# Patient Record
Sex: Male | Born: 1955 | Race: White | Hispanic: No | Marital: Married | Smoking: Former smoker
Health system: Southern US, Community
[De-identification: ages and names within clinical notes are randomized; demographics above are authoritative.]

## PROBLEM LIST (undated history)

## (undated) DIAGNOSIS — G510 Bell's palsy: Secondary | ICD-10-CM

## (undated) DIAGNOSIS — I509 Heart failure, unspecified: Secondary | ICD-10-CM

## (undated) DIAGNOSIS — I10 Essential (primary) hypertension: Secondary | ICD-10-CM

## (undated) HISTORY — PX: HERNIA REPAIR: SHX51

---

## 2001-06-29 ENCOUNTER — Emergency Department (HOSPITAL_COMMUNITY): Admission: EM | Admit: 2001-06-29 | Discharge: 2001-06-29 | Payer: Self-pay | Admitting: Emergency Medicine

## 2002-12-10 ENCOUNTER — Encounter: Admission: RE | Admit: 2002-12-10 | Discharge: 2002-12-10 | Payer: Self-pay | Admitting: General Surgery

## 2002-12-10 ENCOUNTER — Encounter: Payer: Self-pay | Admitting: General Surgery

## 2017-09-12 ENCOUNTER — Other Ambulatory Visit: Payer: Self-pay | Admitting: Internal Medicine

## 2017-09-12 ENCOUNTER — Inpatient Hospital Stay (HOSPITAL_COMMUNITY)
Admission: EM | Admit: 2017-09-12 | Discharge: 2017-09-18 | DRG: 286 | Disposition: A | Discharge status: Home / Self Care | Payer: 59 | Attending: Internal Medicine | Admitting: Internal Medicine

## 2017-09-12 ENCOUNTER — Encounter (HOSPITAL_COMMUNITY): Payer: Self-pay

## 2017-09-12 ENCOUNTER — Ambulatory Visit
Admission: RE | Admit: 2017-09-12 | Discharge: 2017-09-12 | Disposition: A | Discharge status: Home / Self Care | Payer: Self-pay | Source: Ambulatory Visit | Attending: Internal Medicine | Admitting: Internal Medicine

## 2017-09-12 DIAGNOSIS — I509 Heart failure, unspecified: Secondary | ICD-10-CM

## 2017-09-12 DIAGNOSIS — R0602 Shortness of breath: Secondary | ICD-10-CM | POA: Diagnosis not present

## 2017-09-12 DIAGNOSIS — R0600 Dyspnea, unspecified: Secondary | ICD-10-CM

## 2017-09-12 DIAGNOSIS — I255 Ischemic cardiomyopathy: Secondary | ICD-10-CM | POA: Diagnosis present

## 2017-09-12 DIAGNOSIS — Z820 Family history of epilepsy and other diseases of the nervous system: Secondary | ICD-10-CM

## 2017-09-12 DIAGNOSIS — J810 Acute pulmonary edema: Secondary | ICD-10-CM

## 2017-09-12 DIAGNOSIS — I5043 Acute on chronic combined systolic (congestive) and diastolic (congestive) heart failure: Secondary | ICD-10-CM | POA: Diagnosis present

## 2017-09-12 DIAGNOSIS — Z8249 Family history of ischemic heart disease and other diseases of the circulatory system: Secondary | ICD-10-CM

## 2017-09-12 DIAGNOSIS — R600 Localized edema: Secondary | ICD-10-CM

## 2017-09-12 DIAGNOSIS — M109 Gout, unspecified: Secondary | ICD-10-CM | POA: Diagnosis present

## 2017-09-12 DIAGNOSIS — F1010 Alcohol abuse, uncomplicated: Secondary | ICD-10-CM | POA: Diagnosis present

## 2017-09-12 DIAGNOSIS — I110 Hypertensive heart disease with heart failure: Principal | ICD-10-CM | POA: Diagnosis present

## 2017-09-12 DIAGNOSIS — J9601 Acute respiratory failure with hypoxia: Secondary | ICD-10-CM | POA: Diagnosis present

## 2017-09-12 DIAGNOSIS — F17290 Nicotine dependence, other tobacco product, uncomplicated: Secondary | ICD-10-CM | POA: Diagnosis present

## 2017-09-12 DIAGNOSIS — I2582 Chronic total occlusion of coronary artery: Secondary | ICD-10-CM | POA: Diagnosis present

## 2017-09-12 DIAGNOSIS — I2510 Atherosclerotic heart disease of native coronary artery without angina pectoris: Secondary | ICD-10-CM | POA: Diagnosis present

## 2017-09-12 HISTORY — DX: Essential (primary) hypertension: I10

## 2017-09-12 HISTORY — DX: Bell's palsy: G51.0

## 2017-09-12 HISTORY — DX: Heart failure, unspecified: I50.9

## 2017-09-12 LAB — TROPONIN I: Troponin I: <0.03 ng/mL (ref <0.03)

## 2017-09-12 LAB — CBC
HCT: 44.7 % (ref 39.0–52.0)
Hemoglobin: 15.4 g/dL (ref 13.0–17.0)
MCH: 32.8 pg (ref 26.0–34.0)
MCHC: 34.5 g/dL (ref 30.0–36.0)
MCV: 95.3 fL (ref 78.0–100.0)
Platelets: 237 10*3/uL (ref 150–400)
RBC: 4.69 MIL/uL (ref 4.22–5.81)
RDW: 14.3 % (ref 11.5–15.5)
WBC: 6.7 10*3/uL (ref 4.0–10.5)

## 2017-09-12 LAB — BASIC METABOLIC PANEL
Anion gap: 10 (ref 5–15)
BUN: 18 mg/dL (ref 6–20)
CO2: 24 mmol/L (ref 22–32)
Calcium: 9.2 mg/dL (ref 8.9–10.3)
Chloride: 102 mmol/L (ref 101–111)
Creatinine, Ser: 0.97 mg/dL (ref 0.61–1.24)
GFR calc Af Amer: >60 mL/min (ref >60)
GFR calc non Af Amer: >60 mL/min (ref >60)
Glucose, Bld: 110 mg/dL — ABNORMAL HIGH (ref 65–99)
Potassium: 4.7 mmol/L (ref 3.5–5.1)
Sodium: 136 mmol/L (ref 135–145)

## 2017-09-12 LAB — BRAIN NATRIURETIC PEPTIDE: B Natriuretic Peptide: 893.6 pg/mL — ABNORMAL HIGH (ref 0.0–100.0)

## 2017-09-12 NOTE — ED Triage Notes (Signed)
Pt sent by Dr Nehemiah Settle for possible CHF. Pt had blood work done and x-ray completed earlier today and was called and told to come in to have his heart checked. Pt has been having intermittent shob with leg swelling x 2.5 months. Denies pain or shob at this time. VSS.

## 2017-09-13 ENCOUNTER — Other Ambulatory Visit (HOSPITAL_COMMUNITY): Payer: 59

## 2017-09-13 ENCOUNTER — Encounter (HOSPITAL_COMMUNITY): Payer: Self-pay | Admitting: Internal Medicine

## 2017-09-13 DIAGNOSIS — I509 Heart failure, unspecified: Secondary | ICD-10-CM

## 2017-09-13 LAB — CBC
HCT: 43.2 % (ref 39.0–52.0)
Hemoglobin: 14.6 g/dL (ref 13.0–17.0)
MCH: 32.2 pg (ref 26.0–34.0)
MCHC: 33.8 g/dL (ref 30.0–36.0)
MCV: 95.4 fL (ref 78.0–100.0)
Platelets: 215 10*3/uL (ref 150–400)
RBC: 4.53 MIL/uL (ref 4.22–5.81)
RDW: 14 % (ref 11.5–15.5)
WBC: 5.4 10*3/uL (ref 4.0–10.5)

## 2017-09-13 LAB — CREATININE, SERUM
Creatinine, Ser: 0.94 mg/dL (ref 0.61–1.24)
GFR calc Af Amer: >60 mL/min (ref >60)
GFR calc non Af Amer: >60 mL/min (ref >60)

## 2017-09-13 LAB — TROPONIN I: Troponin I: 0.03 ng/mL (ref <0.03)

## 2017-09-13 LAB — MAGNESIUM: Magnesium: 1.7 mg/dL (ref 1.7–2.4)

## 2017-09-13 LAB — HIV ANTIBODY (ROUTINE TESTING W REFLEX): HIV Screen 4th Generation wRfx: NONREACTIVE

## 2017-09-13 LAB — TSH: TSH: 4.681 u[IU]/mL — ABNORMAL HIGH (ref 0.350–4.500)

## 2017-09-13 LAB — T4, FREE: Free T4: 1.12 ng/dL (ref 0.61–1.12)

## 2017-09-13 MED ORDER — FUROSEMIDE 10 MG/ML IJ SOLN
40.0000 mg | Freq: Two times a day (BID) | INTRAMUSCULAR | Status: DC
  Administered 2017-09-13 – 2017-09-14 (×4): 40 mg via INTRAVENOUS
  Filled 2017-09-13 (×4): qty 4

## 2017-09-13 MED ORDER — FUROSEMIDE 10 MG/ML IJ SOLN
40.0000 mg | Freq: Once | INTRAMUSCULAR | Status: AC
  Administered 2017-09-13: 40 mg via INTRAVENOUS
  Filled 2017-09-13: qty 4

## 2017-09-13 MED ORDER — ACETAMINOPHEN 325 MG PO TABS: 650.0000 mg | ORAL_TABLET | Freq: Four times a day (QID) | ORAL | Status: DC | PRN

## 2017-09-13 MED ORDER — ONDANSETRON HCL 4 MG/2ML IJ SOLN: 4.0000 mg | Freq: Four times a day (QID) | INTRAMUSCULAR | Status: DC | PRN

## 2017-09-13 MED ORDER — LORAZEPAM 1 MG PO TABS: 0.0000 mg | ORAL_TABLET | Freq: Four times a day (QID) | ORAL | Status: AC

## 2017-09-13 MED ORDER — THIAMINE HCL 100 MG/ML IJ SOLN
100.0000 mg | Freq: Every day | INTRAMUSCULAR | Status: DC
Start: 2017-09-13 — End: 2017-09-16

## 2017-09-13 MED ORDER — LORAZEPAM 1 MG PO TABS: 1.0000 mg | ORAL_TABLET | Freq: Four times a day (QID) | ORAL | Status: AC | PRN

## 2017-09-13 MED ORDER — LORAZEPAM 2 MG/ML IJ SOLN: 1.0000 mg | Freq: Four times a day (QID) | INTRAMUSCULAR | Status: AC | PRN

## 2017-09-13 MED ORDER — ADULT MULTIVITAMIN W/MINERALS CH
1.0000 | ORAL_TABLET | Freq: Every day | ORAL | Status: DC
  Administered 2017-09-13 – 2017-09-18 (×6): 1 via ORAL
  Filled 2017-09-13 (×6): qty 1

## 2017-09-13 MED ORDER — FOLIC ACID 1 MG PO TABS
1.0000 mg | ORAL_TABLET | Freq: Every day | ORAL | Status: DC
  Administered 2017-09-13 – 2017-09-18 (×6): 1 mg via ORAL
  Filled 2017-09-13 (×6): qty 1

## 2017-09-13 MED ORDER — ENOXAPARIN SODIUM 40 MG/0.4ML ~~LOC~~ SOLN
40.0000 mg | SUBCUTANEOUS | Status: DC
  Administered 2017-09-13 – 2017-09-14 (×2): 40 mg via SUBCUTANEOUS
  Filled 2017-09-13 (×2): qty 0.4

## 2017-09-13 MED ORDER — POTASSIUM CHLORIDE CRYS ER 10 MEQ PO TBCR
10.0000 meq | EXTENDED_RELEASE_TABLET | Freq: Every day | ORAL | Status: DC
  Administered 2017-09-13 – 2017-09-17 (×5): 10 meq via ORAL
  Filled 2017-09-13 (×5): qty 1

## 2017-09-13 MED ORDER — VITAMIN B-1 100 MG PO TABS
100.0000 mg | ORAL_TABLET | Freq: Every day | ORAL | Status: DC
  Administered 2017-09-13 – 2017-09-18 (×6): 100 mg via ORAL
  Filled 2017-09-13 (×6): qty 1

## 2017-09-13 MED ORDER — ONDANSETRON HCL 4 MG PO TABS: 4.0000 mg | ORAL_TABLET | Freq: Four times a day (QID) | ORAL | Status: DC | PRN

## 2017-09-13 MED ORDER — LORAZEPAM 1 MG PO TABS
0.0000 mg | ORAL_TABLET | Freq: Two times a day (BID) | ORAL | Status: AC
Start: 2017-09-15 — End: 2017-09-17

## 2017-09-13 MED ORDER — LISINOPRIL 5 MG PO TABS
5.0000 mg | ORAL_TABLET | Freq: Every day | ORAL | Status: DC
  Administered 2017-09-13 – 2017-09-14 (×2): 5 mg via ORAL
  Filled 2017-09-13 (×2): qty 1

## 2017-09-13 MED ORDER — ACETAMINOPHEN 650 MG RE SUPP: 650.0000 mg | Freq: Four times a day (QID) | RECTAL | Status: DC | PRN

## 2017-09-13 NOTE — H&P (Signed)
History and Physical    TURKI BLOK JGG:[id number] DOB: [birth year] DOA: 09/12/2017  PCP: Renford Dills, MD  Patient coming from: Home.  Chief Complaint: Shortness of breath.  HPI: Shawn Webster is a 61 y.o. male with history of Bell's palsy affecting the left side of the face with history of alcoholism and tobacco abuse has been experiencing shortness of breath on exertion over the last few days with increasing lower extremity edema.  Denies any chest pain or fever chills productive cough.  No recent travel.  Patient had gone to his PCP and was advised to come to the ER after chest x-ray was showing condition and pleural effusion.  ED Course: In the ER patient's labs reveal elevated BNP.  Chest x-ray shows congestion and bilateral pleural effusion.  Patient on my exam as bilateral lower extremity edema with elevated JVD and mild crepitations on chest exam.  Patient was given Lasix 40 mg IV and admitted for CHF.  Review of Systems: As per HPI, rest all negative.   Past Medical History:  Diagnosis Date  . Bell's palsy   . CHF (congestive heart failure) (HCC)   . Hypertension     Past Surgical History:  Procedure Laterality Date  . HERNIA REPAIR       reports that he has been smoking Cigars.  He has never used smokeless tobacco. He reports that he drinks alcohol. He reports that he does not use drugs.  No Known Allergies  Family History  Problem Relation Age of Onset  . Dementia Mother   . CAD Father     Prior to Admission medications   Medication Sig Start Date End Date Taking? Authorizing Provider  furosemide (LASIX) 40 MG tablet Take 40 mg by mouth daily.   Yes [provider]  lisinopril (PRINIVIL,ZESTRIL) 20 MG tablet Take 20 mg by mouth daily.   Yes [provider]  potassium chloride SA (K-DUR,KLOR-CON) 20 MEQ tablet Take 20 mEq by mouth daily.   Yes [provider]    Physical Exam: Vitals:   09/13/17 0300 09/13/17 0315  09/13/17 0330 09/13/17 0406  BP: 129/87 125/87 (!) 128/94 132/86  Pulse: 86 83 86 93  Resp:    18  Temp:    (!) 97.5 F (36.4 C)  TempSrc:    Oral  SpO2: 93% 94% 95% 94%  Weight:    113.1 kg (249 lb 4.8 oz)  Height:    6' (1.829 m)      Constitutional: Moderately built and nourished. Vitals:   09/13/17 0300 09/13/17 0315 09/13/17 0330 09/13/17 0406  BP: 129/87 125/87 (!) 128/94 132/86  Pulse: 86 83 86 93  Resp:    18  Temp:    (!) 97.5 F (36.4 C)  TempSrc:    Oral  SpO2: 93% 94% 95% 94%  Weight:    113.1 kg (249 lb 4.8 oz)  Height:    6' (1.829 m)   Eyes: Anicteric no pallor. ENMT: No discharge from the ears eyes nose or mouth. Neck: JVD elevated no mass felt. Respiratory: No rhonchi mild crepitations. Cardiovascular: S1-S2 heard no murmurs appreciated. Abdomen: Soft nontender bowel sounds present. Musculoskeletal: Bilateral lower extremity edema. Skin: No rash.  Skin appears warm. Neurologic: Alert awake oriented to time place and person.  Moves all extremities. Psychiatric: Appears normal.  Normal affect.   Labs on Admission: I have personally reviewed following labs and imaging studies  CBC:  Recent Labs Lab 09/12/17 1951  WBC 6.7  HGB 15.4  HCT 44.7  MCV 95.3  PLT 237   Basic Metabolic Panel:  Recent Labs Lab 09/12/17 1951  NA 136  K 4.7  CL 102  CO2 24  GLUCOSE 110*  BUN 18  CREATININE 0.97  CALCIUM 9.2   GFR: Estimated Creatinine Clearance: 105.2 mL/min (by C-G formula based on SCr of 0.97 mg/dL). Liver Function Tests: No results for input(s): AST, ALT, ALKPHOS, BILITOT, PROT, ALBUMIN in the last 168 hours. No results for input(s): LIPASE, AMYLASE in the last 168 hours. No results for input(s): AMMONIA in the last 168 hours. Coagulation Profile: No results for input(s): INR, PROTIME in the last 168 hours. Cardiac Enzymes:  Recent Labs Lab 09/12/17 1951  TROPONINI <0.03   BNP (last 3 results) No results for input(s): PROBNP in  the last 8760 hours. HbA1C: No results for input(s): HGBA1C in the last 72 hours. CBG: No results for input(s): GLUCAP in the last 168 hours. Lipid Profile: No results for input(s): CHOL, HDL, LDLCALC, TRIG, CHOLHDL, LDLDIRECT in the last 72 hours. Thyroid Function Tests: No results for input(s): TSH, T4TOTAL, FREET4, T3FREE, THYROIDAB in the last 72 hours. Anemia Panel: No results for input(s): VITAMINB12, FOLATE, FERRITIN, TIBC, IRON, RETICCTPCT in the last 72 hours. Urine analysis: No results found for: COLORURINE, APPEARANCEUR, LABSPEC, PHURINE, GLUCOSEU, HGBUR, BILIRUBINUR, KETONESUR, PROTEINUR, UROBILINOGEN, NITRITE, LEUKOCYTESUR Sepsis Labs: @LABRCNTIP (procalcitonin:4,lacticidven:4) )No results found for this or any previous visit (from the past 240 hour(s)).   Radiological Exams on Admission: Dg Chest 2 View  Result Date: 09/12/2017 CLINICAL DATA:  Shortness of breath. EXAM: CHEST  2 VIEW COMPARISON:  Chest x-ray 12/10/2002 rib. FINDINGS: Mediastinum and hilar structures normal. Cardiomegaly with mild pulmonary vascular prominence, interstitial prominence, and bilateral pleural effusions consistent with CHF. Low lung volumes. IMPRESSION: 1. Mild congestive heart failure with mild pulmonary interstitial edema small pleural effusions. 2. Low lung volumes with basilar atelectasis Electronically Signed   By: Maisie Fushomas  Register   On: 09/12/2017 11:19    EKG: Independently reviewed.  Normal sinus rhythm incomplete right bundle branch block.  Assessment/Plan Principal Problem:   CHF (congestive heart failure) (HCC)    1. Acute CHF unknown EF -I have placed patient on Lasix 40 mg IV every 12.  Also placed lisinopril 5 mg p.o. daily.  Closely follow intake output and metabolic panel and daily weights.  Check 2D echo.  Check troponin. 2. Alcohol abuse -which could account for #1.  Patient has been placed on CIWA protocol. 3. Tobacco abuse -advised to quit. 4. History of gout -no signs  of exacerbation.   DVT prophylaxis: Lovenox. Code Status: Full code. Family Communication: Discussed with patient. Disposition Plan: Home. Consults called: None. Admission status: Observation.   Eduard ClosKAKRAKANDY,Marlaina Coburn N. MD [facility] Pager [phone].  If 7PM-7AM, please contact night-coverage [email or web address] Password [email or web address]  09/13/2017, 5:41 AM

## 2017-09-13 NOTE — ED Provider Notes (Signed)
[facility] EMERGENCY DEPARTMENT Provider Note   CSN: [id number] Arrival date & time: 09/12/17  1815     History   Chief Complaint Chief Complaint  Patient presents with  . Shortness of Breath  . Leg Swelling    HPI Shawn Webster is a 61 y.o. male.  HPI  This is a 61 year old male with history of hypertension who presents with concerns for lower extremity swelling and shortness of breath.Patient reports 2-3 month history of worsening shortness of breath. He has had worsening lower extremity swelling over this time. No known history of heart failure.  Denies any history of coronary artery disease. Heat baseline has two-pillow orthopnea. States he has chest pain intermittently but is currently without chest pain. He establish with a primary care physician today who did blood work, chest x-ray, and scheduled an echocardiogram. He was called regarding his x-ray and told that he needed to come to the ED. Currently he is without pain. He is at his baseline. He was started on Lasix and took one dose prior to arrival.  Past Medical History:  Diagnosis Date  . Bell's palsy   . CHF (congestive heart failure) (HCC)   . Hypertension     There are no active problems to display for this patient.   Past Surgical History:  Procedure Laterality Date  . HERNIA REPAIR         Home Medications    Prior to Admission medications   Medication Sig Start Date End Date Taking? Authorizing Provider  furosemide (LASIX) 40 MG tablet Take 40 mg by mouth daily.   Yes [provider]  lisinopril (PRINIVIL,ZESTRIL) 20 MG tablet Take 20 mg by mouth daily.   Yes [provider]  potassium chloride SA (K-DUR,KLOR-CON) 20 MEQ tablet Take 20 mEq by mouth daily.   Yes [provider]    Family History History reviewed. No pertinent family history.  Social History Social History  Substance Use Topics  . Smoking status: Current Every Day Smoker   Types: Cigars  . Smokeless tobacco: Not on file     Comment: 1 cigar per day  . Alcohol use Yes     Comment: 2 martinis per day     Allergies   Patient has no known allergies.   Review of Systems Review of Systems  Constitutional: Negative for fever.  Respiratory: Positive for shortness of breath. Negative for cough.   Cardiovascular: Positive for chest pain and leg swelling.  Gastrointestinal: Negative for abdominal pain, nausea and vomiting.  All other systems reviewed and are negative.    Physical Exam Updated Vital Signs BP 129/87   Pulse 86   Temp 98.1 F (36.7 C) (Oral)   Resp 18   Ht 6\' 1"  (1.854 m)   SpO2 93%   Physical Exam  Constitutional: He is oriented to person, place, and time. He appears well-developed and well-nourished.  Overweight, no acute distress  HENT:  Head: Normocephalic and atraumatic.  Cardiovascular: Normal rate, regular rhythm and normal heart sounds.   No murmur heard. Pulmonary/Chest: Effort normal. No respiratory distress. He has no wheezes. He has no rales.  Diminished breath sounds bilateral lower lobes  Abdominal: Soft. Bowel sounds are normal. There is no tenderness. There is no rebound.  Musculoskeletal: He exhibits edema.  2+ bilateral lower extremity pitting edema  Neurological: He is alert and oriented to person, place, and time.  Skin: Skin is warm and dry.  Psychiatric: He has a normal mood  and affect.  Nursing note and vitals reviewed.    ED Treatments / Results  Labs (all labs ordered are listed, but only abnormal results are displayed) Labs Reviewed  BASIC METABOLIC PANEL - Abnormal; Notable for the following:       Result Value   Glucose, Bld 110 (*)    All other components within normal limits  BRAIN NATRIURETIC PEPTIDE - Abnormal; Notable for the following:    B Natriuretic Peptide 893.6 (*)    All other components within normal limits  CBC  TROPONIN I    EKG  EKG Interpretation  Date/Time:  Tuesday  September 12 2017 19:42:29 EDT Ventricular Rate:  93 PR Interval:  194 QRS Duration: 98 QT Interval:  378 QTC Calculation: 469 R Axis:   110 Text Interpretation:  Normal sinus rhythm Possible Left atrial enlargement Incomplete right bundle branch block Possible Anterolateral infarct , age undetermined Abnormal ECG Low voltage; anterior changes new from 2002 Confirmed by Ross MarcusHorton, Tinika Bucknam ([id number]) on 09/13/2017 12:20:41 AM       Radiology Dg Chest 2 View  Result Date: 09/12/2017 CLINICAL DATA:  Shortness of breath. EXAM: CHEST  2 VIEW COMPARISON:  Chest x-ray 12/10/2002 rib. FINDINGS: Mediastinum and hilar structures normal. Cardiomegaly with mild pulmonary vascular prominence, interstitial prominence, and bilateral pleural effusions consistent with CHF. Low lung volumes. IMPRESSION: 1. Mild congestive heart failure with mild pulmonary interstitial edema small pleural effusions. 2. Low lung volumes with basilar atelectasis Electronically Signed   By: Maisie Fushomas  Register   On: 09/12/2017 11:19    Procedures Procedures (including critical care time)  Medications Ordered in ED Medications  furosemide (LASIX) injection 40 mg (40 mg Intravenous Given 09/13/17 0049)     Initial Impression / Assessment and Plan / ED Course  I have reviewed the triage vital signs and the nursing notes.  Pertinent labs & imaging results that were available during my care of the patient were reviewed by me and considered in my medical decision making (see chart for details).     Patient presents with acute on chronic shortness of breath, lower extremity swelling. He is overall nontoxic-appearing on exam. Vital signs reviewed and largely reassuring. EKG shows no evidence of ischemia. He does not appear in any respiratory distress. I have reviewed his chest x-ray from outside facility. He has cardiomegaly with evidence of mild heart failure and small bilateral pleural effusions. Bnp is elevated. Patient was given 1  dose of 40 mg of IV Lasix. Discussed with patient follow-up as an outpatient for echocardiogram versus inpatient evaluation. Unfortunately, with ambulation, patient drops his O2 sats in the low 80s. He does not have home oxygen. Because of this I feel he would be better managed as an inpatient for evaluation and further diuresis.    Final Clinical Impressions(s) / ED Diagnoses   Final diagnoses:  Acute pulmonary edema (HCC)  Bilateral lower extremity edema  Shortness of breath    New Prescriptions New Prescriptions   No medications on file     Shon BatonHorton, Monicia Tse F, MD 09/13/17 [phone]

## 2017-09-13 NOTE — ED Notes (Signed)
Writer ambulated, pt's O2 sats dropped to 86 on room air.

## 2017-09-13 NOTE — Progress Notes (Signed)
Patient received to unit. Patient  awake oriented x4 . Call bell within reach of patient. Text paged Dr Toniann Fail for orders. Awaiting  call response.

## 2017-09-13 NOTE — Progress Notes (Signed)
PROGRESS NOTE    Shawn Webster  XLK:[id number] DOB: 04-19-1956 DOA: 09/12/2017 PCP: Renford Dills, MD   Brief Narrative:  Shawn Webster is a 61 y.o. male with history of Bell's palsy affecting the left side of the face with history of alcoholism and tobacco abuse has been experiencing shortness of breath on exertion over the last few days with increasing lower extremity edema.  Denies any chest pain or fever chills productive cough.  No recent travel.  Patient had gone to his PCP and was advised to come to the ER after chest x-ray was showing condition and pleural effusion.  ED Course: In the ER patient's labs reveal elevated BNP.  Chest x-ray shows congestion and bilateral pleural effusion.  Patient on my exam as bilateral lower extremity edema with elevated JVD and mild crepitations on chest exam.  Patient was given Lasix 40 mg IV and admitted for CHF.  Assessment & Plan:   Principal Problem:   CHF (congestive heart failure) (HCC)   Acute CHF; awaiting ECHO to determine type of HF.  Continue with IV lasix.  Daily weight, strict I and.  ECHO pending.  On lisinopril.   Acute hypoxic respiratory failure; secondary to HF, pulmonary edema.  Continue with IV lasix.   Alcohol abuse; CIWA protocol.   Tobacco abuse -advised to quit.  History of gout -no signs of exacerbation.    DVT prophylaxis: Lovenox Code Status: full code.  Family Communication: care discussed with patient.  Disposition Plan: home when stable.   Consultants:   none   Procedures: none   Antimicrobials: none   Subjective: He report improvement of dyspnea. His oxygen drop to 80 on ambulation.  Denies chest pain  Father with history of HF  Objective: Vitals:   09/13/17 0315 09/13/17 0330 09/13/17 0406 09/13/17 0800  BP: 125/87 (!) 128/94 132/86 132/87  Pulse: 83 86 93 84  Resp:   18 18  Temp:   (!) 97.5 F (36.4 C) 98.2 F (36.8 C)  TempSrc:   Oral Oral  SpO2: 94% 95% 94% 98%    Weight:   113.1 kg (249 lb 4.8 oz)   Height:   6' (1.829 m)     Intake/Output Summary (Last 24 hours) at 09/13/17 1104 Last data filed at 09/13/17 0957  Gross per 24 hour  Intake              240 ml  Output              725 ml  Net             -485 ml   Filed Weights   09/13/17 0406  Weight: 113.1 kg (249 lb 4.8 oz)    Examination:  General exam: Appears calm and comfortable  Respiratory system: Clear to auscultation. Respiratory effort normal. Cardiovascular system: S1 & S2 heard, RRR. No JVD, murmurs, rubs, gallops or clicks. No pedal edema. Gastrointestinal system: Abdomen is nondistended, soft and nontender. No organomegaly or masses felt. Normal bowel sounds heard. Central nervous system: Alert and oriented. No focal neurological deficits. Extremities: Symmetric 5 x 5 power. Skin: No rashes, lesions or ulcers    Data Reviewed: I have personally reviewed following labs and imaging studies  CBC:  Recent Labs Lab 09/12/17 1951 09/13/17 0549  WBC 6.7 5.4  HGB 15.4 14.6  HCT 44.7 43.2  MCV 95.3 95.4  PLT 237 215   Basic Metabolic Panel:  Recent Labs Lab 09/12/17 1951 09/13/17 0549  NA  136  --   K 4.7  --   CL 102  --   CO2 24  --   GLUCOSE 110*  --   BUN 18  --   CREATININE 0.97 0.94  CALCIUM 9.2  --   MG  --  1.7   GFR: Estimated Creatinine Clearance: 108.5 mL/min (by C-G formula based on SCr of 0.94 mg/dL). Liver Function Tests: No results for input(s): AST, ALT, ALKPHOS, BILITOT, PROT, ALBUMIN in the last 168 hours. No results for input(s): LIPASE, AMYLASE in the last 168 hours. No results for input(s): AMMONIA in the last 168 hours. Coagulation Profile: No results for input(s): INR, PROTIME in the last 168 hours. Cardiac Enzymes:  Recent Labs Lab 09/12/17 1951 09/13/17 0549  TROPONINI <0.03 0.03*   BNP (last 3 results) No results for input(s): PROBNP in the last 8760 hours. HbA1C: No results for input(s): HGBA1C in the last 72  hours. CBG: No results for input(s): GLUCAP in the last 168 hours. Lipid Profile: No results for input(s): CHOL, HDL, LDLCALC, TRIG, CHOLHDL, LDLDIRECT in the last 72 hours. Thyroid Function Tests:  Recent Labs  09/13/17 0549  TSH 4.681*   Anemia Panel: No results for input(s): VITAMINB12, FOLATE, FERRITIN, TIBC, IRON, RETICCTPCT in the last 72 hours. Sepsis Labs: No results for input(s): PROCALCITON, LATICACIDVEN in the last 168 hours.  No results found for this or any previous visit (from the past 240 hour(s)).       Radiology Studies: Dg Chest 2 View  Result Date: 09/12/2017 CLINICAL DATA:  Shortness of breath. EXAM: CHEST  2 VIEW COMPARISON:  Chest x-ray 12/10/2002 rib. FINDINGS: Mediastinum and hilar structures normal. Cardiomegaly with mild pulmonary vascular prominence, interstitial prominence, and bilateral pleural effusions consistent with CHF. Low lung volumes. IMPRESSION: 1. Mild congestive heart failure with mild pulmonary interstitial edema small pleural effusions. 2. Low lung volumes with basilar atelectasis Electronically Signed   By: Maisie Fushomas  Register   On: 09/12/2017 11:19        Scheduled Meds: . enoxaparin (LOVENOX) injection  40 mg Subcutaneous Q24H  . folic acid  1 mg Oral Daily  . furosemide  40 mg Intravenous Q12H  . lisinopril  5 mg Oral Daily  . LORazepam  0-4 mg Oral Q6H   Followed by  . [START ON 09/15/2017] LORazepam  0-4 mg Oral Q12H  . multivitamin with minerals  1 tablet Oral Daily  . potassium chloride  10 mEq Oral Daily  . thiamine  100 mg Oral Daily   Or  . thiamine  100 mg Intravenous Daily   Continuous Infusions:   LOS: 0 days    Time spent: 25 minutes.     Alba Coryegalado, Fraya Ueda A, MD [facility] Pager [phone]  If 7PM-7AM, please contact night-coverage [email or web address] Password [email or web address] 09/13/2017, 11:04 AM

## 2017-09-14 ENCOUNTER — Observation Stay (HOSPITAL_BASED_OUTPATIENT_CLINIC_OR_DEPARTMENT_OTHER): Payer: 59

## 2017-09-14 ENCOUNTER — Other Ambulatory Visit (HOSPITAL_COMMUNITY): Payer: Self-pay

## 2017-09-14 DIAGNOSIS — I5043 Acute on chronic combined systolic (congestive) and diastolic (congestive) heart failure: Secondary | ICD-10-CM | POA: Diagnosis present

## 2017-09-14 DIAGNOSIS — R0602 Shortness of breath: Secondary | ICD-10-CM

## 2017-09-14 DIAGNOSIS — F17290 Nicotine dependence, other tobacco product, uncomplicated: Secondary | ICD-10-CM | POA: Diagnosis present

## 2017-09-14 DIAGNOSIS — I25110 Atherosclerotic heart disease of native coronary artery with unstable angina pectoris: Secondary | ICD-10-CM | POA: Diagnosis not present

## 2017-09-14 DIAGNOSIS — I2510 Atherosclerotic heart disease of native coronary artery without angina pectoris: Secondary | ICD-10-CM | POA: Diagnosis present

## 2017-09-14 DIAGNOSIS — I509 Heart failure, unspecified: Secondary | ICD-10-CM

## 2017-09-14 DIAGNOSIS — Z820 Family history of epilepsy and other diseases of the nervous system: Secondary | ICD-10-CM | POA: Diagnosis not present

## 2017-09-14 DIAGNOSIS — I255 Ischemic cardiomyopathy: Secondary | ICD-10-CM | POA: Diagnosis present

## 2017-09-14 DIAGNOSIS — J810 Acute pulmonary edema: Secondary | ICD-10-CM

## 2017-09-14 DIAGNOSIS — I236 Thrombosis of atrium, auricular appendage, and ventricle as current complications following acute myocardial infarction: Secondary | ICD-10-CM | POA: Diagnosis not present

## 2017-09-14 DIAGNOSIS — J9601 Acute respiratory failure with hypoxia: Secondary | ICD-10-CM | POA: Diagnosis present

## 2017-09-14 DIAGNOSIS — M109 Gout, unspecified: Secondary | ICD-10-CM | POA: Diagnosis present

## 2017-09-14 DIAGNOSIS — I513 Intracardiac thrombosis, not elsewhere classified: Secondary | ICD-10-CM | POA: Diagnosis not present

## 2017-09-14 DIAGNOSIS — I10 Essential (primary) hypertension: Secondary | ICD-10-CM | POA: Diagnosis not present

## 2017-09-14 DIAGNOSIS — I428 Other cardiomyopathies: Secondary | ICD-10-CM | POA: Diagnosis not present

## 2017-09-14 DIAGNOSIS — I2582 Chronic total occlusion of coronary artery: Secondary | ICD-10-CM | POA: Diagnosis present

## 2017-09-14 DIAGNOSIS — Z8249 Family history of ischemic heart disease and other diseases of the circulatory system: Secondary | ICD-10-CM | POA: Diagnosis not present

## 2017-09-14 DIAGNOSIS — I110 Hypertensive heart disease with heart failure: Secondary | ICD-10-CM | POA: Diagnosis present

## 2017-09-14 DIAGNOSIS — F1010 Alcohol abuse, uncomplicated: Secondary | ICD-10-CM | POA: Diagnosis present

## 2017-09-14 LAB — ECHOCARDIOGRAM COMPLETE
Height: 72 in
Weight: 3819.2 oz

## 2017-09-14 LAB — CBC
HCT: 46.4 % (ref 39.0–52.0)
Hemoglobin: 15.4 g/dL (ref 13.0–17.0)
MCH: 31.7 pg (ref 26.0–34.0)
MCHC: 33.2 g/dL (ref 30.0–36.0)
MCV: 95.5 fL (ref 78.0–100.0)
Platelets: 224 10*3/uL (ref 150–400)
RBC: 4.86 MIL/uL (ref 4.22–5.81)
RDW: 14.1 % (ref 11.5–15.5)
WBC: 5.7 10*3/uL (ref 4.0–10.5)

## 2017-09-14 LAB — ECHOCARDIOGRAM LIMITED
Height: 72 in
Weight: 3819.2 oz

## 2017-09-14 LAB — BASIC METABOLIC PANEL
Anion gap: 10 (ref 5–15)
BUN: 15 mg/dL (ref 6–20)
CO2: 29 mmol/L (ref 22–32)
Calcium: 8.8 mg/dL — ABNORMAL LOW (ref 8.9–10.3)
Chloride: 97 mmol/L — ABNORMAL LOW (ref 101–111)
Creatinine, Ser: 0.87 mg/dL (ref 0.61–1.24)
GFR calc Af Amer: >60 mL/min (ref >60)
GFR calc non Af Amer: >60 mL/min (ref >60)
Glucose, Bld: 117 mg/dL — ABNORMAL HIGH (ref 65–99)
Potassium: 3.9 mmol/L (ref 3.5–5.1)
Sodium: 136 mmol/L (ref 135–145)

## 2017-09-14 LAB — T3, FREE: T3, Free: 2.9 pg/mL (ref 2.0–4.4)

## 2017-09-14 MED ORDER — PERFLUTREN LIPID MICROSPHERE
1.0000 mL | INTRAVENOUS | Status: AC | PRN
  Administered 2017-09-14: 2 mL via INTRAVENOUS
  Filled 2017-09-14: qty 10

## 2017-09-14 MED ORDER — HEPARIN BOLUS VIA INFUSION
4000.0000 [IU] | Freq: Once | INTRAVENOUS | Status: AC
  Administered 2017-09-14: 4000 [IU] via INTRAVENOUS
  Filled 2017-09-14: qty 4000

## 2017-09-14 MED ORDER — HEPARIN (PORCINE) IN NACL 100-0.45 UNIT/ML-% IJ SOLN
1600.0000 [IU]/h | INTRAMUSCULAR | Status: DC
  Administered 2017-09-14 – 2017-09-15 (×2): 1600 [IU]/h via INTRAVENOUS
  Filled 2017-09-14 (×2): qty 250

## 2017-09-14 NOTE — Progress Notes (Signed)
Echocardiogram 2D Echocardiogram has been performed.  Pieter Partridge 09/14/2017, 12:38 PM

## 2017-09-14 NOTE — Plan of Care (Signed)
Problem: Tissue Perfusion: Goal: Risk factors for ineffective tissue perfusion will decrease Outcome: Progressing Maintains oxygen saturation in the 90's on room air   Problem: Activity: Goal: Risk for activity intolerance will decrease Outcome: Progressing States shortness of breath has resolved   Problem: Fluid Volume: Goal: Ability to maintain a balanced intake and output will improve Outcome: Progressing Continues with IV diuresis

## 2017-09-14 NOTE — Care Management Note (Signed)
Case Management Note  Patient Details  Name: JEAN-PAUL KOSH MRN: [id number] Date of Birth: 06/23/1956  Subjective/Objective:     CHF              Action/Plan: Patient lives at home with spouse; PCP: Renford Dills, MD; has private insurance with [employer] with prescription drug coverage; awaiting physical therapy eval for disposition needs; CM will continue to follow for DCP.  Expected Discharge Date:     Possibly 09/15/2017             Expected Discharge Plan: possibly Home w Home Health Services  Discharge planning Services  CM Consult  Status of Service:  In process, will continue to follow  Reola Mosher [phone] 09/14/2017, 11:38 AM

## 2017-09-14 NOTE — Progress Notes (Signed)
ANTICOAGULATION CONSULT NOTE - Initial Consult  Pharmacy Consult for Heparin Indication: possible apycal thrombus  No Known Allergies  Patient Measurements: Height: 6' (182.9 cm) Weight: 238 lb 11.2 oz (108.3 kg) (scale b) IBW/kg (Calculated) : 77.6 Heparin Dosing Weight: 100 kg  Vital Signs: Temp: 98.4 F (36.9 C) (10/25 0521) Temp Source: Oral (10/25 0521) BP: 111/82 (10/25 1020) Pulse Rate: 90 (10/25 1020)  Labs:  Recent Labs  09/12/17 1951 09/13/17 0549 09/14/17 0326  HGB 15.4 14.6 15.4  HCT 44.7 43.2 46.4  PLT 237 215 224  CREATININE 0.97 0.94 0.87  TROPONINI <0.03 0.03*  --     Estimated Creatinine Clearance: 114.8 mL/min (by C-G formula based on SCr of 0.87 mg/dL).   Medical History: Past Medical History:  Diagnosis Date  . Bell's palsy   . CHF (congestive heart failure) (HCC)   . Hypertension     Medications:  Prescriptions Prior to Admission  Medication Sig Dispense Refill Last Dose  . furosemide (LASIX) 40 MG tablet Take 40 mg by mouth daily.   09/12/2017 at Unknown time  . lisinopril (PRINIVIL,ZESTRIL) 20 MG tablet Take 20 mg by mouth daily.   09/12/2017 at Unknown time  . potassium chloride SA (K-DUR,KLOR-CON) 20 MEQ tablet Take 20 mEq by mouth daily.   09/12/2017 at Unknown time   Scheduled:  . folic acid  1 mg Oral Daily  . furosemide  40 mg Intravenous Q12H  . lisinopril  5 mg Oral Daily  . LORazepam  0-4 mg Oral Q6H   Followed by  . [START ON 09/15/2017] LORazepam  0-4 mg Oral Q12H  . multivitamin with minerals  1 tablet Oral Daily  . potassium chloride  10 mEq Oral Daily  . thiamine  100 mg Oral Daily   Or  . thiamine  100 mg Intravenous Daily    Assessment: 61 y.o male with to start on IV heparin infusion for possible apycal thrombus. Received lovenox 40mg  SQ this AM at 10:25am. Wt 108kg,  CBC wnl .  Not on Anticoagulation PTA.  SCr wnl.   H/o alcoholism, tobacco abuse,  HTN, Bell's palsy, CHF.    Goal of Therapy:  Heparin  level 0.3-0.7 units/ml Monitor platelets by anticoagulation protocol: Yes   Plan:  Heparin bolus 4000 units  x1 Heparin drip at 1600 units/hr Heparin level 6 hours after heparin start Daily heparin level, CBC  Thank you for allowing pharmacy to be part of this patients care team. Noah Delaine, RPh Clinical Pharmacist Pager: [phone] 8a-330p [phone] 330p-1030p phone [phone] or [phone] Main pharmacy [phone] 09/14/2017,3:13 PM

## 2017-09-14 NOTE — Progress Notes (Signed)
  Echocardiogram 2D Echocardiogram limited with definity has been performed.  Ordell Prichett L Androw 09/14/2017, 3:56 PM

## 2017-09-14 NOTE — Progress Notes (Addendum)
PROGRESS NOTE    Shawn CrazierMichael A Webster  WUJ:[id number] DOB: 1955/11/28 DOA: 09/12/2017 PCP: Renford DillsPolite, Ronald, MD   Brief Narrative:  Shawn CrazierMichael A Webster is a 61 y.o. male with history of Bell's palsy affecting the left side of the face with history of alcoholism and tobacco abuse has been experiencing shortness of breath on exertion over the last few days with increasing lower extremity edema.  Denies any chest pain or fever chills productive cough.  No recent travel.  Patient had gone to his PCP and was advised to come to the ER after chest x-ray was showing condition and pleural effusion.  ED Course: In the ER patient's labs reveal elevated BNP.  Chest x-ray shows congestion and bilateral pleural effusion.  Patient on my exam as bilateral lower extremity edema with elevated JVD and mild crepitations on chest exam.  Patient was given Lasix 40 mg IV and admitted for CHF.  Assessment & Plan:   Principal Problem:   CHF (congestive heart failure) (HCC)   Acute Systolic CHF; ECHO with EF 20 %.  Continue with IV lasix. BID.  Daily weight, strict I and.  Started on  lisinopril.  Negative 4 L.  Weight; 113---108 Cardiology consulted due to new onset HF.   Possible layered apical thrombus;  Discussed with Dr Jens Somrenshaw, start Heparin and repeat limited ECHO with definity/  Cardiology consulted.   Acute hypoxic respiratory failure; secondary to HF, pulmonary edema.  Continue with IV lasix.  Check oxygen on ambulation.   Alcohol abuse; CIWA protocol.   Tobacco abuse -advised to quit.  History of gout -no signs of exacerbation.    DVT prophylaxis: Lovenox Code Status: full code.  Family Communication: care discussed with patient.  Disposition Plan: home when stable.   Consultants:   none   Procedures: none   Antimicrobials: none   Subjective: Report improvement of dyspnea. He has not required oxygen at rest.    Objective: Vitals:   09/13/17 1938 09/13/17 1956 09/14/17 0521  09/14/17 1020  BP: 120/89  136/86 111/82  Pulse: 87  87 90  Resp: 18  18   Temp:  98.1 F (36.7 C) 98.4 F (36.9 C)   TempSrc:   Oral   SpO2: 96%  96% 95%  Weight:   108.3 kg (238 lb 11.2 oz)   Height:        Intake/Output Summary (Last 24 hours) at 09/14/17 1539 Last data filed at 09/14/17 1451  Gross per 24 hour  Intake              610 ml  Output             4375 ml  Net            -3765 ml   Filed Weights   09/13/17 0406 09/14/17 0521  Weight: 113.1 kg (249 lb 4.8 oz) 108.3 kg (238 lb 11.2 oz)    Examination:  General exam; NAD Respiratory system: Crackles bases.  Cardiovascular system: S 1, S 2 RRR, trace edema.  Gastrointestinal system: BS present, soft, nt Central nervous system: non focal.  Extremities: Symmetric power.  Skin: No rashes, lesions or ulcers    Data Reviewed: I have personally reviewed following labs and imaging studies  CBC:  Recent Labs Lab 09/12/17 1951 09/13/17 0549 09/14/17 0326  WBC 6.7 5.4 5.7  HGB 15.4 14.6 15.4  HCT 44.7 43.2 46.4  MCV 95.3 95.4 95.5  PLT 237 215 224   Basic Metabolic Panel:  Recent  Labs Lab 09/12/17 1951 09/13/17 0549 09/14/17 0326  NA 136  --  136  K 4.7  --  3.9  CL 102  --  97*  CO2 24  --  29  GLUCOSE 110*  --  117*  BUN 18  --  15  CREATININE 0.97 0.94 0.87  CALCIUM 9.2  --  8.8*  MG  --  1.7  --    GFR: Estimated Creatinine Clearance: 114.8 mL/min (by C-G formula based on SCr of 0.87 mg/dL). Liver Function Tests: No results for input(s): AST, ALT, ALKPHOS, BILITOT, PROT, ALBUMIN in the last 168 hours. No results for input(s): LIPASE, AMYLASE in the last 168 hours. No results for input(s): AMMONIA in the last 168 hours. Coagulation Profile: No results for input(s): INR, PROTIME in the last 168 hours. Cardiac Enzymes:  Recent Labs Lab 09/12/17 1951 09/13/17 0549  TROPONINI <0.03 0.03*   BNP (last 3 results) No results for input(s): PROBNP in the last 8760 hours. HbA1C: No  results for input(s): HGBA1C in the last 72 hours. CBG: No results for input(s): GLUCAP in the last 168 hours. Lipid Profile: No results for input(s): CHOL, HDL, LDLCALC, TRIG, CHOLHDL, LDLDIRECT in the last 72 hours. Thyroid Function Tests:  Recent Labs  09/13/17 0549 09/13/17 1112  TSH 4.681*  --   FREET4  --  1.12  T3FREE  --  2.9   Anemia Panel: No results for input(s): VITAMINB12, FOLATE, FERRITIN, TIBC, IRON, RETICCTPCT in the last 72 hours. Sepsis Labs: No results for input(s): PROCALCITON, LATICACIDVEN in the last 168 hours.  No results found for this or any previous visit (from the past 240 hour(s)).       Radiology Studies: No results found.      Scheduled Meds: . folic acid  1 mg Oral Daily  . furosemide  40 mg Intravenous Q12H  . heparin  4,000 Units Intravenous Once  . lisinopril  5 mg Oral Daily  . LORazepam  0-4 mg Oral Q6H   Followed by  . [START ON 09/15/2017] LORazepam  0-4 mg Oral Q12H  . multivitamin with minerals  1 tablet Oral Daily  . potassium chloride  10 mEq Oral Daily  . thiamine  100 mg Oral Daily   Or  . thiamine  100 mg Intravenous Daily   Continuous Infusions: . heparin       LOS: 0 days    Time spent: 25 minutes.     Alba Cory, MD [facility] Pager [phone]  If 7PM-7AM, please contact night-coverage [email or web address] Password [facility] 09/14/2017, 3:39 PM

## 2017-09-14 NOTE — Consult Note (Signed)
Primary Physician: Primary Cardiologist:  New  Asked to see by Dr Shawn Webster for CHF  HPI: Patient is a 61 yo with history of EtOH abuse, tobacco abuse and Bells palsy. Presents to [employer] on 10/24 for SOB with exertion and LE edema  No CP  Pt says that initially edema started in LE  Moved up to thighs  Then noted SOB with activity like walking up driveway.    On arrival to ED CXR with evid of volume overload  Started on IV lasix    Echo showed LVEF 20%  Diruesed  Started on lisinopril  Wt down 5 kg    Pt says that  His breathing is better   Still with some edema  FHX positve for CAD in father who also developed CHF  Paternal GM had CAD  Pt drinks 2 martinis and 1 wine per day  Smokes 1 cigar per day       Past Medical History:  Diagnosis Date  . Bell's palsy   . CHF (congestive heart failure) (HCC)   . Hypertension     Medications Prior to Admission  Medication Sig Dispense Refill  . furosemide (LASIX) 40 MG tablet Take 40 mg by mouth daily.    Marland Kitchen lisinopril (PRINIVIL,ZESTRIL) 20 MG tablet Take 20 mg by mouth daily.    . potassium chloride SA (K-DUR,KLOR-CON) 20 MEQ tablet Take 20 mEq by mouth daily.       . folic acid  1 mg Oral Daily  . furosemide  40 mg Intravenous Q12H  . heparin  4,000 Units Intravenous Once  . lisinopril  5 mg Oral Daily  . LORazepam  0-4 mg Oral Q6H   Followed by  . [START ON 09/15/2017] LORazepam  0-4 mg Oral Q12H  . multivitamin with minerals  1 tablet Oral Daily  . potassium chloride  10 mEq Oral Daily  . thiamine  100 mg Oral Daily   Or  . thiamine  100 mg Intravenous Daily    Infusions: . heparin      No Known Allergies  Social History   Social History  . Marital status: Married    Spouse name: N/A  . Number of children: N/A  . Years of education: N/A   Occupational History  . Not on file.   Social History Main Topics  . Smoking status: Current Every Day Smoker    Types: Cigars  . Smokeless tobacco: Never Used   Comment: 1 cigar per day  . Alcohol use Yes     Comment: 2 martinis per day  . Drug use: No  . Sexual activity: Not on file   Other Topics Concern  . Not on file   Social History Narrative  . No narrative on file    Family History  Problem Relation Age of Onset  . Dementia Mother   . CAD Father     REVIEW OF SYSTEMS:  All systems reviewed  Negative to the above problem except as noted above.    PHYSICAL EXAM: Vitals:   09/14/17 0521 09/14/17 1020  BP: 136/86 111/82  Pulse: 87 90  Resp: 18   Temp: 98.4 F (36.9 C)   SpO2: 96% 95%     Intake/Output Summary (Last 24 hours) at 09/14/17 1531 Last data filed at 09/14/17 1451  Gross per 24 hour  Intake              610 ml  Output  4375 ml  Net            -3765 ml    General:  Well appearing. No respiratory difficulty HEENT: normal Neck: supple. no JVD. Carotids 2+ bilat; no bruits. No lymphadenopathy or thryomegaly appreciated. Cor: PMI nondisplaced. Regular rate & rhythm. No rubs, gallops or murmurs. Lungs: clear Abdomen: soft, nontender, nondistended. No hepatosplenomegaly. No bruits or masses. Good bowel sounds. Extremities: no cyanosis, clubbing, rash, edema Neuro: alert & oriented x 3, cranial nerves grossly intact. moves all 4 extremities w/o difficulty. Affect pleasant.  ECG:  SR 93 bpm  Incoomplete RBBB  Poor R wave prgression  Low voltage EKG    Results for orders placed or performed during the hospital encounter of 09/12/17 (from the past 24 hour(s))  Basic metabolic panel     Status: Abnormal   Collection Time: 09/14/17  3:26 AM  Result Value Ref Range   Sodium 136 135 - 145 mmol/L   Potassium 3.9 3.5 - 5.1 mmol/L   Chloride 97 (L) 101 - 111 mmol/L   CO2 29 22 - 32 mmol/L   Glucose, Bld 117 (H) 65 - 99 mg/dL   BUN 15 6 - 20 mg/dL   Creatinine, Ser 3.240.87 0.61 - 1.24 mg/dL   Calcium 8.8 (L) 8.9 - 10.3 mg/dL   GFR calc non Af Amer >60 >60 mL/min   GFR calc Af Amer >60 >60 mL/min   Anion gap  10 5 - 15  CBC     Status: None   Collection Time: 09/14/17  3:26 AM  Result Value Ref Range   WBC 5.7 4.0 - 10.5 K/uL   RBC 4.86 4.22 - 5.81 MIL/uL   Hemoglobin 15.4 13.0 - 17.0 g/dL   HCT 40.146.4 02.739.0 - 25.352.0 %   MCV 95.5 78.0 - 100.0 fL   MCH 31.7 26.0 - 34.0 pg   MCHC 33.2 30.0 - 36.0 g/dL   RDW 66.414.1 40.311.5 - 47.415.5 %   Platelets 224 150 - 400 K/uL   No results found.   ASSESSMENT:  Pt is a 61 yo who was admitted with SOB  Folund to be in CHF   Echo with svere LV dysfunction, mild RV dysfunction  Limited echo with contrast  Pending to r/o thrombus He has diuresed significantly   REcomm:   R and L heart cath to define anatomy/pressuress  Counselled on tobacco and EtOH Currently on lisinopril  WOuld add b blocker after cath  Continue diuresis.  [facility]   Anticoagulation based on echo findings.    Shawn PatesPaula Samrat Webster

## 2017-09-14 NOTE — Progress Notes (Signed)
Patient w/o complaint during 7 a to 7 p shift, states his shortness of breath has improved since receiving Lasix.  Patient aware that he is nothing by mouth after midnight for cardiac cath 10/26, watched video regarding cath as well.  Consent on chart.

## 2017-09-15 ENCOUNTER — Encounter (HOSPITAL_COMMUNITY)
Admission: EM | Disposition: A | Discharge status: Home / Self Care | Payer: Self-pay | Source: Home / Self Care | Attending: Internal Medicine

## 2017-09-15 ENCOUNTER — Encounter (HOSPITAL_COMMUNITY): Payer: Self-pay | Admitting: Cardiology

## 2017-09-15 ENCOUNTER — Inpatient Hospital Stay (HOSPITAL_COMMUNITY): Payer: 59

## 2017-09-15 DIAGNOSIS — I255 Ischemic cardiomyopathy: Secondary | ICD-10-CM

## 2017-09-15 DIAGNOSIS — I428 Other cardiomyopathies: Secondary | ICD-10-CM

## 2017-09-15 DIAGNOSIS — I10 Essential (primary) hypertension: Secondary | ICD-10-CM

## 2017-09-15 DIAGNOSIS — I25110 Atherosclerotic heart disease of native coronary artery with unstable angina pectoris: Secondary | ICD-10-CM

## 2017-09-15 DIAGNOSIS — I2510 Atherosclerotic heart disease of native coronary artery without angina pectoris: Secondary | ICD-10-CM

## 2017-09-15 HISTORY — PX: RIGHT/LEFT HEART CATH AND CORONARY ANGIOGRAPHY: CATH118266

## 2017-09-15 HISTORY — PX: ULTRASOUND GUIDANCE FOR VASCULAR ACCESS: SHX6516

## 2017-09-15 LAB — HEPARIN LEVEL (UNFRACTIONATED)
Heparin Unfractionated: 0.43 IU/mL (ref 0.30–0.70)
Heparin Unfractionated: 0.4 IU/mL (ref 0.30–0.70)
Heparin Unfractionated: 0.37 IU/mL (ref 0.30–0.70)

## 2017-09-15 LAB — BASIC METABOLIC PANEL
Anion gap: 12 (ref 5–15)
BUN: 16 mg/dL (ref 6–20)
CO2: 26 mmol/L (ref 22–32)
Calcium: 9 mg/dL (ref 8.9–10.3)
Chloride: 98 mmol/L — ABNORMAL LOW (ref 101–111)
Creatinine, Ser: 0.88 mg/dL (ref 0.61–1.24)
GFR calc Af Amer: >60 mL/min (ref >60)
GFR calc non Af Amer: >60 mL/min (ref >60)
Glucose, Bld: 126 mg/dL — ABNORMAL HIGH (ref 65–99)
Potassium: 3.7 mmol/L (ref 3.5–5.1)
Sodium: 136 mmol/L (ref 135–145)

## 2017-09-15 LAB — POCT I-STAT 3, VENOUS BLOOD GAS (G3P V)
Acid-Base Excess: 1 mmol/L (ref 0.0–2.0)
Acid-Base Excess: 2 mmol/L (ref 0.0–2.0)
Bicarbonate: 25.2 mmol/L (ref 20.0–28.0)
Bicarbonate: 27.2 mmol/L (ref 20.0–28.0)
O2 Saturation: 68 %
O2 Saturation: 70 %
TCO2: 26 mmol/L (ref 22–32)
TCO2: 29 mmol/L (ref 22–32)
pCO2, Ven: 39.7 mmHg — ABNORMAL LOW (ref 44.0–60.0)
pCO2, Ven: 43.3 mmHg — ABNORMAL LOW (ref 44.0–60.0)
pH, Ven: 7.406 (ref 7.250–7.430)
pH, Ven: 7.411 (ref 7.250–7.430)
pO2, Ven: 35 mmHg (ref 32.0–45.0)
pO2, Ven: 36 mmHg (ref 32.0–45.0)

## 2017-09-15 LAB — CBC
HCT: 47 % (ref 39.0–52.0)
Hemoglobin: 16.2 g/dL (ref 13.0–17.0)
MCH: 32.9 pg (ref 26.0–34.0)
MCHC: 34.5 g/dL (ref 30.0–36.0)
MCV: 95.3 fL (ref 78.0–100.0)
Platelets: 211 10*3/uL (ref 150–400)
RBC: 4.93 MIL/uL (ref 4.22–5.81)
RDW: 13.8 % (ref 11.5–15.5)
WBC: 6.9 10*3/uL (ref 4.0–10.5)

## 2017-09-15 LAB — PROTIME-INR
INR: 1.1
Prothrombin Time: 14.1 s (ref 11.4–15.2)

## 2017-09-15 SURGERY — ULTRASOUND GUIDANCE, FOR VASCULAR ACCESS

## 2017-09-15 MED ORDER — SODIUM CHLORIDE 0.9% FLUSH
3.0000 mL | Freq: Two times a day (BID) | INTRAVENOUS | Status: DC
  Administered 2017-09-15 – 2017-09-18 (×7): 3 mL via INTRAVENOUS

## 2017-09-15 MED ORDER — MIDAZOLAM HCL 2 MG/2ML IJ SOLN
INTRAMUSCULAR | Status: AC
  Filled 2017-09-15: qty 2

## 2017-09-15 MED ORDER — WARFARIN SODIUM 7.5 MG PO TABS
7.5000 mg | ORAL_TABLET | Freq: Once | ORAL | Status: AC
  Administered 2017-09-15: 7.5 mg via ORAL
  Filled 2017-09-15: qty 1

## 2017-09-15 MED ORDER — SPIRONOLACTONE 25 MG PO TABS
12.5000 mg | ORAL_TABLET | Freq: Every day | ORAL | Status: DC
  Administered 2017-09-15 – 2017-09-18 (×4): 12.5 mg via ORAL
  Filled 2017-09-15 (×5): qty 1

## 2017-09-15 MED ORDER — HEPARIN (PORCINE) IN NACL 2-0.9 UNIT/ML-% IJ SOLN
INTRAMUSCULAR | Status: AC | PRN
  Administered 2017-09-15: 1000 mL

## 2017-09-15 MED ORDER — SODIUM CHLORIDE 0.9 % IV SOLN: 250.0000 mL | INTRAVENOUS | Status: DC | PRN

## 2017-09-15 MED ORDER — ASPIRIN 81 MG PO CHEW
81.0000 mg | CHEWABLE_TABLET | Freq: Every day | ORAL | Status: DC
  Administered 2017-09-16 – 2017-09-18 (×3): 81 mg via ORAL
  Filled 2017-09-15 (×4): qty 1

## 2017-09-15 MED ORDER — FENTANYL CITRATE (PF) 100 MCG/2ML IJ SOLN
INTRAMUSCULAR | Status: AC
  Filled 2017-09-15: qty 2

## 2017-09-15 MED ORDER — SACUBITRIL-VALSARTAN 24-26 MG PO TABS
1.0000 | ORAL_TABLET | Freq: Two times a day (BID) | ORAL | Status: DC
  Administered 2017-09-16 – 2017-09-18 (×5): 1 via ORAL
  Filled 2017-09-15 (×5): qty 1

## 2017-09-15 MED ORDER — ATORVASTATIN CALCIUM 80 MG PO TABS
80.0000 mg | ORAL_TABLET | Freq: Every day | ORAL | Status: DC
  Administered 2017-09-15 – 2017-09-17 (×3): 80 mg via ORAL
  Filled 2017-09-15 (×3): qty 1

## 2017-09-15 MED ORDER — ACETAMINOPHEN 325 MG PO TABS: 650.0000 mg | ORAL_TABLET | ORAL | Status: DC | PRN

## 2017-09-15 MED ORDER — SODIUM CHLORIDE 0.9% FLUSH: 3.0000 mL | INTRAVENOUS | Status: DC | PRN

## 2017-09-15 MED ORDER — FUROSEMIDE 10 MG/ML IJ SOLN
40.0000 mg | Freq: Two times a day (BID) | INTRAMUSCULAR | Status: DC
  Administered 2017-09-15 (×2): 40 mg via INTRAVENOUS
  Filled 2017-09-15 (×3): qty 4

## 2017-09-15 MED ORDER — VERAPAMIL HCL 2.5 MG/ML IV SOLN
INTRAVENOUS | Status: DC | PRN
  Administered 2017-09-15: 10 mL via INTRA_ARTERIAL

## 2017-09-15 MED ORDER — LIDOCAINE HCL 2 % IJ SOLN
INTRAMUSCULAR | Status: DC | PRN
  Administered 2017-09-15: 1 mL
  Administered 2017-09-15: 2 mL

## 2017-09-15 MED ORDER — VERAPAMIL HCL 2.5 MG/ML IV SOLN
INTRAVENOUS | Status: AC
  Filled 2017-09-15: qty 2

## 2017-09-15 MED ORDER — ONDANSETRON HCL 4 MG/2ML IJ SOLN: 4.0000 mg | Freq: Four times a day (QID) | INTRAMUSCULAR | Status: DC | PRN

## 2017-09-15 MED ORDER — SODIUM CHLORIDE 0.9% FLUSH
3.0000 mL | Freq: Two times a day (BID) | INTRAVENOUS | Status: DC
  Administered 2017-09-15: 3 mL via INTRAVENOUS

## 2017-09-15 MED ORDER — MIDAZOLAM HCL 2 MG/2ML IJ SOLN
INTRAMUSCULAR | Status: DC | PRN
  Administered 2017-09-15: 1 mg via INTRAVENOUS

## 2017-09-15 MED ORDER — SODIUM CHLORIDE 0.9 % IV SOLN: INTRAVENOUS | Status: DC

## 2017-09-15 MED ORDER — FENTANYL CITRATE (PF) 100 MCG/2ML IJ SOLN
INTRAMUSCULAR | Status: DC | PRN
  Administered 2017-09-15: 25 ug via INTRAVENOUS

## 2017-09-15 MED ORDER — LIDOCAINE HCL 2 % IJ SOLN
INTRAMUSCULAR | Status: AC
  Filled 2017-09-15: qty 20

## 2017-09-15 MED ORDER — HEPARIN SODIUM (PORCINE) 1000 UNIT/ML IJ SOLN
INTRAMUSCULAR | Status: AC
  Filled 2017-09-15: qty 1

## 2017-09-15 MED ORDER — GADOBENATE DIMEGLUMINE 529 MG/ML IV SOLN
35.0000 mL | Freq: Once | INTRAVENOUS | Status: AC
  Administered 2017-09-15: 35 mL via INTRAVENOUS

## 2017-09-15 MED ORDER — ASPIRIN 81 MG PO CHEW
81.0000 mg | CHEWABLE_TABLET | ORAL | Status: AC
  Administered 2017-09-15: 81 mg via ORAL
  Filled 2017-09-15: qty 1

## 2017-09-15 MED ORDER — HEPARIN (PORCINE) IN NACL 100-0.45 UNIT/ML-% IJ SOLN
1750.0000 [IU]/h | INTRAMUSCULAR | Status: DC
  Administered 2017-09-16: 1800 [IU]/h via INTRAVENOUS
  Administered 2017-09-16: 1750 [IU]/h via INTRAVENOUS
  Filled 2017-09-15 (×2): qty 250

## 2017-09-15 MED ORDER — SODIUM CHLORIDE 0.9 % IV SOLN
250.0000 mL | INTRAVENOUS | Status: DC | PRN
Start: 2017-09-15 — End: 2017-09-15

## 2017-09-15 MED ORDER — IOPAMIDOL (ISOVUE-370) INJECTION 76%
INTRAVENOUS | Status: AC
  Filled 2017-09-15: qty 100

## 2017-09-15 MED ORDER — IOPAMIDOL (ISOVUE-370) INJECTION 76%
INTRAVENOUS | Status: DC | PRN
  Administered 2017-09-15: 100 mL via INTRA_ARTERIAL

## 2017-09-15 MED ORDER — HEPARIN SODIUM (PORCINE) 1000 UNIT/ML IJ SOLN
INTRAMUSCULAR | Status: DC | PRN
  Administered 2017-09-15: 5000 [IU] via INTRAVENOUS

## 2017-09-15 MED ORDER — WARFARIN - PHARMACIST DOSING INPATIENT
Freq: Every day | Status: DC
Start: 2017-09-15 — End: 2017-09-18
  Administered 2017-09-16 – 2017-09-17 (×2)

## 2017-09-15 SURGICAL SUPPLY — 14 items
CATH BALLN WEDGE 5F 110CM (CATHETERS) ×1 IMPLANT
CATH IMPULSE 5F ANG/FL3.5 (CATHETERS) ×1 IMPLANT
COVER PRB 48X5XTLSCP FOLD TPE (BAG) IMPLANT
COVER PROBE 5X48 (BAG) ×3
DEVICE RAD COMP TR BAND LRG (VASCULAR PRODUCTS) ×1 IMPLANT
GLIDESHEATH SLEND SS 6F .021 (SHEATH) ×1 IMPLANT
GUIDEWIRE INQWIRE 1.5J.035X260 (WIRE) IMPLANT
INQWIRE 1.5J .035X260CM (WIRE) ×3
KIT HEART LEFT (KITS) ×3 IMPLANT
PACK CARDIAC CATHETERIZATION (CUSTOM PROCEDURE TRAY) ×3 IMPLANT
SHEATH GLIDE SLENDER 4/5FR (SHEATH) ×1 IMPLANT
TRANSDUCER W/STOPCOCK (MISCELLANEOUS) ×3 IMPLANT
TUBING CIL FLEX 10 FLL-RA (TUBING) ×3 IMPLANT
WIRE MICROINTRODUCER 60CM (WIRE) ×1 IMPLANT

## 2017-09-15 NOTE — Progress Notes (Signed)
Received patient post Ca Cath, alert and oriented no complaints of any discomfort. Right radial site CDI TR band with 10cc air per report, Right brachial with pressure dressing on.No signs and symptoms of bleeding or hematoma. Will monitor accordingly.

## 2017-09-15 NOTE — Consult Note (Addendum)
Advanced Heart Failure Team Consult Note   Primary Cardiologist:  New  Reason for Consultation: CHF  HPI:    Shawn Webster is seen today for evaluation of CHF at the request of Dr. Tenny Craw.   Patient has minimal past history.  He smokes a cigar a day and has about 3 ETOH beverages/day.  He has developed progressive lower extremity edema staring in ankles and progressing up to thighs over several weeks.  Over the last week, he has noted dyspnea walking short distances such as up his driveway.  +Orthopnea.  He has not had any chest pain.  Of note, father had first MI in his early 9s.  Patient came to the ER and was noted to be volume overloaded by exam and CXR.  He was diuresed with IV Lasix.  Echo was done, showing EF 20-25% with LV apical thrombus.  I reviewed the echo today, he does appear to have a thrombus present.  He has diuresed well so far and feels better symptomatically.  Today, I did a cardiac cath which showed ongoing mild elevation of filling pressures with pulmonary venous hypertension and preserved cardiac output.  There was severe 2 vessel coronary disease with diffuse severe disease in the LCx and suspected chronic total occlusion of the proximal LAD.    Echo: EF 20-25%, suspected LV apical thrombus.    RHC/LHC:  Coronary Findings   Dominance: Right  Left Main  No significant disease.  Left Anterior Descending  40% followed by 50% proximal LAD stenoses. There is a large D1 with mild diffuse disease throughout. The proximal LAD is totally occluded just after D1 take-off. There is some collateralization of the mid to distal LAD but not much.  Left Circumflex  Extensive disease in the LCx system. 40% ostial LCx. There is a medium-sized high OM1 with luminal irregularities. There is a complex 95% stenosis in the mid LCx followed by post-stenotic dilatation and another 95% stenosis. There is a small OM2 that originates from the mid LCx and has 95% ostial stenosis. There  is mild to moderate diffuse disease in the distal LCx which supplies a medium-sized PLOM.  Right Coronary Artery  Diffuse mild disease in the RCA and PDA/PLV. 40% stenosis proximal RCA, 30% mid RCA, 40% distal RCA.  Right Heart   Right Heart Pressures RHC Procedural Findings: Hemodynamics (mmHg) RA mean 8 RV 50/7 PA 54/23, mean 34 PCWP mean 22 LV 121/28 AO 123/87  Oxygen saturations: PA 69% AO 92%  Cardiac Output (Fick) 6.01  Cardiac Index (Fick) 2.62 PVR 2 WU       Review of Systems: All systems reviewed and negative except as per HPI.   Home Medications Prior to Admission medications   Medication Sig Start Date End Date Taking? Authorizing Provider  furosemide (LASIX) 40 MG tablet Take 40 mg by mouth daily.   Yes [provider]  lisinopril (PRINIVIL,ZESTRIL) 20 MG tablet Take 20 mg by mouth daily.   Yes [provider]  potassium chloride SA (K-DUR,KLOR-CON) 20 MEQ tablet Take 20 mEq by mouth daily.   Yes [provider]    Past Medical History: Past Medical History:  Diagnosis Date  . Bell's palsy   . CHF (congestive heart failure) (HCC)   . Hypertension     Past Surgical History: Past Surgical History:  Procedure Laterality Date  . HERNIA REPAIR      Family History: Family History  Problem Relation Age of Onset  . Dementia Mother   .  CAD Father     Social History: Social History   Social History  . Marital status: Married    Spouse name: N/A  . Number of children: N/A  . Years of education: N/A   Social History Main Topics  . Smoking status: Current Every Day Smoker    Types: Cigars  . Smokeless tobacco: Never Used     Comment: 1 cigar per day  . Alcohol use Yes     Comment: 2 martinis per day  . Drug use: No  . Sexual activity: Not Asked   Other Topics Concern  . None   Social History Narrative  . None    Allergies:  No Known Allergies  Objective:    Vital Signs:   Temp:  [97.9 F (36.6 C)-98.9  F (37.2 C)] 98.9 F (37.2 C) (10/26 0528) Pulse Rate:  [0-99] 89 (10/26 0900) Resp:  [0-42] 14 (10/26 0900) BP: (111-145)/(82-111) 134/100 (10/26 0900) SpO2:  [0 %-98 %] 94 % (10/26 0900) Weight:  [235 lb 1.6 oz (106.6 kg)] 235 lb 1.6 oz (106.6 kg) (10/26 0528) Last BM Date: 09/14/17  Weight change: Filed Weights   09/13/17 0406 09/14/17 0521 09/15/17 0528  Weight: 249 lb 4.8 oz (113.1 kg) 238 lb 11.2 oz (108.3 kg) 235 lb 1.6 oz (106.6 kg)    Intake/Output:   Intake/Output Summary (Last 24 hours) at 09/15/17 0915 Last data filed at 09/15/17 0602  Gross per 24 hour  Intake           703.46 ml  Output             3375 ml  Net         -2671.54 ml      Physical Exam    General:  Well appearing. No resp difficulty HEENT: normal Neck: supple. JVP 10 cm. Carotids 2+ bilat; no bruits. No lymphadenopathy or thyromegaly appreciated. Cor: PMI nondisplaced. Regular rate & rhythm. No rubs, gallops or murmurs. Lungs: clear Abdomen: soft, nontender, nondistended. No hepatosplenomegaly. No bruits or masses. Good bowel sounds. Extremities: no cyanosis, clubbing, rash.  1+ edema 3/4 to knees bilaterally.  Neuro: alert & orientedx3, cranial nerves grossly intact. moves all 4 extremities w/o difficulty. Affect pleasant   Telemetry   NSR (personally reviewed)  EKG    NSR, poor anterior R wave progression  Labs   Basic Metabolic Panel:  Recent Labs Lab 09/12/17 1951 09/13/17 0549 09/14/17 0326 09/15/17 0518  NA 136  --  136 136  K 4.7  --  3.9 3.7  CL 102  --  97* 98*  CO2 24  --  29 26  GLUCOSE 110*  --  117* 126*  BUN 18  --  15 16  CREATININE 0.97 0.94 0.87 0.88  CALCIUM 9.2  --  8.8* 9.0  MG  --  1.7  --   --     Liver Function Tests: No results for input(s): AST, ALT, ALKPHOS, BILITOT, PROT, ALBUMIN in the last 168 hours. No results for input(s): LIPASE, AMYLASE in the last 168 hours. No results for input(s): AMMONIA in the last 168 hours.  CBC:  Recent  Labs Lab 09/12/17 1951 09/13/17 0549 09/14/17 0326 09/15/17 0518  WBC 6.7 5.4 5.7 6.9  HGB 15.4 14.6 15.4 16.2  HCT 44.7 43.2 46.4 47.0  MCV 95.3 95.4 95.5 95.3  PLT 237 215 224 211    Cardiac Enzymes:  Recent Labs Lab 09/12/17 1951 09/13/17 0549  TROPONINI <0.03 0.03*  BNP: BNP (last 3 results)  Recent Labs  09/12/17 1951  BNP 893.6*    ProBNP (last 3 results) No results for input(s): PROBNP in the last 8760 hours.   CBG: No results for input(s): GLUCAP in the last 168 hours.  Coagulation Studies:  Recent Labs  09/15/17 0518  LABPROT 14.1  INR 1.10     Imaging    No results found.   Medications:     Current Medications: . aspirin  81 mg Oral Daily  . atorvastatin  80 mg Oral q1800  . [MAR Hold] folic acid  1 mg Oral Daily  . furosemide  40 mg Intravenous BID  . [MAR Hold] LORazepam  0-4 mg Oral Q12H  . [MAR Hold] multivitamin with minerals  1 tablet Oral Daily  . [MAR Hold] potassium chloride  10 mEq Oral Daily  . [START ON 09/16/2017] sacubitril-valsartan  1 tablet Oral BID  . sodium chloride flush  3 mL Intravenous Q12H  . sodium chloride flush  3 mL Intravenous Q12H  . spironolactone  12.5 mg Oral Daily  . [MAR Hold] thiamine  100 mg Oral Daily   Or  . [MAR Hold] thiamine  100 mg Intravenous Daily     Infusions: . sodium chloride    . sodium chloride Stopped (09/15/17 0511)  . sodium chloride    . heparin 1,600 Units/hr (09/15/17 0510)       Patient Profile   [age] yo with minimal past history was admitted with progressive CHF symptoms and lower extremity swelling.   Assessment/Plan   1. Acute systolic CHF: Echo with EF 20-25%.  Ischemic cardiomyopathy based on cath today though heavy ETOH may play a role.  He still has some volume overload on exam and by RHC.  - Will continue Lasix 40 mg IV bid today.  - Stop lisinopril (last dose yesterday) and start Entresto 24/26 bid tomorrow.  - Spironolactone 12.5 mg daily.  -  Will need Coreg eventually.  2. CAD: Severe 2 vessel disease.  Diffuse, severely diseased LCx system and chronic total occlusion proximal LAD. I think percutaneous options are going to be limited here, but he also does not appear to have good LAD target for CABG.  No chest pain, presented with CHF.  - I will have TCTS see him.  I am going to get a cardiac MRI to look for LAD territory viability.  If no CABG option and LAD territory is viable, could consider percutaneous CTO procedure.  - Start statin, ASA 81 daily.  3. LV thrombus: I reviewed the echo today.  He does appear to have an LV apical thrombus.   - Will start heparin back this afternoon.  If no CABG or intervention, will need to go on warfarin.  4. Smoking: Smokes cigars, encouraged to quit.  5. ETOH abuse: History of heavy ETOH.  Suspect not cause of cardiomyopathy, but encourage to cut back.   Length of Stay: 1  Marca Anconaalton Caelin Rayl, MD  09/15/2017, 9:15 AM  Advanced Heart Failure Team Pager [phone] (M-F; 7a - 4p)  Please contact [facility] for night-coverage after hours (4p -7a ) and weekends on [email or web address]  Discussed with Dr Laneta SimmersBartle, LAD is not graftable.   Cardiac MRI showed inferolateral aneurysm.  Inferolateral wall does not appear viable so suspect intervention on LCx would not be helpful.  There does appear to be significant viability in the anterior and anteroseptal walls, only the true apex appears non-viable by delayed enhancement imaging.  I have  asked Dr. [place] to review the films to see if opening CTO of LAD would be feasible.   Marca Ancona 09/15/2017 3:12 PM  Discussed with Dr. [place], he does not think opening LAD would be feasible percutaneously.  Will plan medical management.  Start warfarin tonight.   Marca Ancona 09/15/2017 5:13 PM

## 2017-09-15 NOTE — Interval H&P Note (Signed)
History and Physical Interval Note:  09/15/2017 7:41 AM  Shawn Webster  has presented today for surgery, with the diagnosis of chf  The various methods of treatment have been discussed with the patient and family. After consideration of risks, benefits and other options for treatment, the patient has consented to  Procedure(s): RIGHT/LEFT HEART CATH AND CORONARY ANGIOGRAPHY (N/A) as a surgical intervention .  The patient's history has been reviewed, patient examined, no change in status, stable for surgery.  I have reviewed the patient's chart and labs.  Questions were answered to the patient's satisfaction.     Krystie Leiter [employer]

## 2017-09-15 NOTE — Progress Notes (Signed)
Patient stable and has been prepped for cardiac cath.

## 2017-09-15 NOTE — Progress Notes (Signed)
ANTICOAGULATION CONSULT NOTE - Follow Up Consult  Pharmacy Consult for Heparin  Indication: Possible apical thrombus  No Known Allergies  Patient Measurements: Height: 6' (182.9 cm) Weight: 238 lb 11.2 oz (108.3 kg) (scale b) IBW/kg (Calculated) : 77.6  Vital Signs: Temp: 97.9 F (36.6 C) (10/25 2048) Temp Source: Oral (10/25 2048) BP: 121/84 (10/25 2048) Pulse Rate: 88 (10/25 2048)  Labs:  Recent Labs  09/12/17 1951 09/13/17 0549 09/14/17 0326 09/15/17 0042  HGB 15.4 14.6 15.4  --   HCT 44.7 43.2 46.4  --   PLT 237 215 224  --   HEPARINUNFRC  --   --   --  0.43  CREATININE 0.97 0.94 0.87  --   TROPONINI <0.03 0.03*  --   --     Estimated Creatinine Clearance: 114.8 mL/min (by C-G formula based on SCr of 0.87 mg/dL).   Assessment: On heparin for possible apical thrombus, cath today, heparin level therapeutic   Goal of Therapy:  Heparin level 0.3-0.7 units/ml Monitor platelets by anticoagulation protocol: Yes   Plan:  -Cont heparin at 1600 units/hr -Heparin level with AM labs  Abran Duke 09/15/2017,2:39 AM

## 2017-09-15 NOTE — Progress Notes (Signed)
ANTICOAGULATION CONSULT NOTE - Follow Up Consult  Pharmacy Consult for Heparin  Indication: Possible apical thrombus  No Known Allergies  Patient Measurements: Height: 6' (182.9 cm) Weight: 235 lb 1.6 oz (106.6 kg) (scale b) IBW/kg (Calculated) : 77.6  Vital Signs: Temp: 98.9 F (37.2 C) (10/26 0528) Temp Source: Oral (10/26 0528) BP: 134/100 (10/26 0900) Pulse Rate: 89 (10/26 0900)  Labs:  Recent Labs  09/12/17 1951 09/13/17 0549 09/14/17 0326 09/15/17 0042 09/15/17 0518  HGB 15.4 14.6 15.4  --  16.2  HCT 44.7 43.2 46.4  --  47.0  PLT 237 215 224  --  211  LABPROT  --   --   --   --  14.1  INR  --   --   --   --  1.10  HEPARINUNFRC  --   --   --  0.43 0.40  CREATININE 0.97 0.94 0.87  --  0.88  TROPONINI <0.03 0.03*  --   --   --     Estimated Creatinine Clearance: 112.6 mL/min (by C-G formula based on SCr of 0.88 mg/dL).   Assessment: On heparin for possible apical thrombus. HL this AM was 0.4 on Heparin 1600 units/hr , therapeutic x2 on 1600 units/hr.  Patient had cardiac cath done this AM. Found severe 2v CAD with LCx  diffuse, severe disease and proximal LAD is totally occluded with poor collateralization.  TCTS consulted.  Post cath : to  restart heparin drip  8h post sheath removal. Sheath removed at 0831, no bleeding or hematoma noted.  CBC remains wnl  Goal of Therapy:  Heparin level 0.3-0.7 units/ml Monitor platelets by anticoagulation protocol: Yes   Plan:  -Today at 16:30 restart IV heparin at 1600 units/hr -check Heparin level in 6  Hours.  Daily Heparin level and CBC  Noah Delaine, RPh Clinical Pharmacist Pager: [phone] 8a-330p [phone] 330p-1030p phone [phone] or [phone] Main pharmacy [phone] 09/15/2017,9:59 AM

## 2017-09-15 NOTE — Progress Notes (Signed)
PT Cancellation Note  Patient Details Name: Shawn Webster MRN: [id number] DOB: 1956/10/31   Cancelled Treatment:    Reason Eval/Treat Not Completed: Patient at procedure or test/unavailable (Pt in CATH lab.  Will check back. )   [name] 09/15/2017, 9:59 AM [name] [phone] (pager)

## 2017-09-15 NOTE — Consult Note (Signed)
[address]       [place] [address]             [phone]      Cardiothoracic Surgery Consultation  Reason for Consult: Severe multi-vessel coronary artery disease with ischemic cardiomyopathy Referring Physician: Dr. Loralie Champagne  Shawn Webster is an 61 y.o. male.  HPI:   The patient is a 61 year old gentleman with a history of hypertension and daily cigar smoking, family hx of CAD who presented with a several month history of progressive lower extremity swelling progressing to worsening exertional shortness of breath and now resting dyspnea and orthopnea. He denies any history of chest pain or pressure. Stamina was good until this started although he did not exercise. When he presented to ER he was volume overloaded on exam and by CXR. Echo showed an EF of 20-25% with LV apical thrombus. He improved with diuresis and has lost 15 lbs over the past two days. Cardiac cath today shows severe 2-vessel CAD. The LAD is occluded after a large diagonal and fills faintly and partially by collateral and appears diffusely diseased and small. The LCX has diffuse severe disease. The OM1 is medium sized with no stenosis. The OM2 is small and there is 95% LCX stenosis at its origin. The distal PL is a moderate sized vessel and compromised by the severe LCX stenoses. The RCA is a dominant vessel with diffuse disease but no significant stenosis. PAP was 54/23 with CI 2.62 and PA sat 69%. He had a cardiac MR today and the results are pending.  Past Medical History:  Diagnosis Date  . Bell's palsy   . CHF (congestive heart failure) ([facility])   . Hypertension     Past Surgical History:  Procedure Laterality Date  . HERNIA REPAIR    . RIGHT/LEFT HEART CATH AND CORONARY ANGIOGRAPHY N/A 09/15/2017   Procedure: RIGHT/LEFT HEART CATH AND CORONARY ANGIOGRAPHY;  Surgeon: Larey Dresser, MD;  Location: [facility];  Service: Cardiovascular;  Laterality: N/A;  . ULTRASOUND  GUIDANCE FOR VASCULAR ACCESS  09/15/2017   Procedure: Ultrasound Guidance For Vascular Access;  Surgeon: Larey Dresser, MD;  Location: [facility];  Service: Cardiovascular;;    Family History  Problem Relation Age of Onset  . Dementia Mother   . CAD Father     Social History:  reports that he has been smoking Cigars.  He has never used smokeless tobacco. He reports that he drinks alcohol. He reports that he does not use drugs.  Allergies: No Known Allergies  Medications:  I have reviewed the patient's current medications. Prior to Admission:  Prescriptions Prior to Admission  Medication Sig Dispense Refill Last Dose  . furosemide (LASIX) 40 MG tablet Take 40 mg by mouth daily.   09/12/2017 at Unknown time  . lisinopril (PRINIVIL,ZESTRIL) 20 MG tablet Take 20 mg by mouth daily.   09/12/2017 at Unknown time  . potassium chloride SA (K-DUR,KLOR-CON) 20 MEQ tablet Take 20 mEq by mouth daily.   09/12/2017 at Unknown time   Scheduled: . aspirin  81 mg Oral Daily  . atorvastatin  80 mg Oral q1800  . folic acid  1 mg Oral Daily  . furosemide  40 mg Intravenous BID  . LORazepam  0-4 mg Oral Q12H  . multivitamin with minerals  1 tablet Oral Daily  . potassium chloride  10 mEq Oral Daily  . [START ON 09/16/2017] sacubitril-valsartan  1  tablet Oral BID  . sodium chloride flush  3 mL Intravenous Q12H  . spironolactone  12.5 mg Oral Daily  . thiamine  100 mg Oral Daily   Or  . thiamine  100 mg Intravenous Daily   Continuous: . sodium chloride    . heparin     TML:YYTKPT chloride, acetaminophen **OR** acetaminophen, LORazepam **OR** LORazepam, ondansetron **OR** ondansetron (ZOFRAN) IV, sodium chloride flush Anti-infectives    None      Results for orders placed or performed during the hospital encounter of 09/12/17 (from the past 48 hour(s))  Basic metabolic panel     Status: Abnormal   Collection Time: 09/14/17  3:26 AM  Result Value Ref Range   Sodium 136 135 - 145  mmol/L   Potassium 3.9 3.5 - 5.1 mmol/L    Comment: DELTA CHECK NOTED   Chloride 97 (L) 101 - 111 mmol/L   CO2 29 22 - 32 mmol/L   Glucose, Bld 117 (H) 65 - 99 mg/dL   BUN 15 6 - 20 mg/dL   Creatinine, Ser 0.87 0.61 - 1.24 mg/dL   Calcium 8.8 (L) 8.9 - 10.3 mg/dL   GFR calc non Af Amer >60 >60 mL/min   GFR calc Af Amer >60 >60 mL/min    Comment: (NOTE) The eGFR has been calculated using the CKD EPI equation. This calculation has not been validated in all clinical situations. eGFR's persistently <60 mL/min signify possible Chronic Kidney Disease.    Anion gap 10 5 - 15  CBC     Status: None   Collection Time: 09/14/17  3:26 AM  Result Value Ref Range   WBC 5.7 4.0 - 10.5 K/uL   RBC 4.86 4.22 - 5.81 MIL/uL   Hemoglobin 15.4 13.0 - 17.0 g/dL   HCT 46.4 39.0 - 52.0 %   MCV 95.5 78.0 - 100.0 fL   MCH 31.7 26.0 - 34.0 pg   MCHC 33.2 30.0 - 36.0 g/dL   RDW 14.1 11.5 - 15.5 %   Platelets 224 150 - 400 K/uL  Heparin level (unfractionated)     Status: None   Collection Time: 09/15/17 12:42 AM  Result Value Ref Range   Heparin Unfractionated 0.43 0.30 - 0.70 IU/mL    Comment:        IF HEPARIN RESULTS ARE BELOW EXPECTED VALUES, AND PATIENT DOSAGE HAS BEEN CONFIRMED, SUGGEST FOLLOW UP TESTING OF ANTITHROMBIN III LEVELS.   Basic metabolic panel     Status: Abnormal   Collection Time: 09/15/17  5:18 AM  Result Value Ref Range   Sodium 136 135 - 145 mmol/L   Potassium 3.7 3.5 - 5.1 mmol/L   Chloride 98 (L) 101 - 111 mmol/L   CO2 26 22 - 32 mmol/L   Glucose, Bld 126 (H) 65 - 99 mg/dL   BUN 16 6 - 20 mg/dL   Creatinine, Ser 0.88 0.61 - 1.24 mg/dL   Calcium 9.0 8.9 - 10.3 mg/dL   GFR calc non Af Amer >60 >60 mL/min   GFR calc Af Amer >60 >60 mL/min    Comment: (NOTE) The eGFR has been calculated using the CKD EPI equation. This calculation has not been validated in all clinical situations. eGFR's persistently <60 mL/min signify possible Chronic Kidney Disease.    Anion gap  12 5 - 15  Heparin level (unfractionated)     Status: None   Collection Time: 09/15/17  5:18 AM  Result Value Ref Range   Heparin Unfractionated 0.40 0.30 -  0.70 IU/mL    Comment:        IF HEPARIN RESULTS ARE BELOW EXPECTED VALUES, AND PATIENT DOSAGE HAS BEEN CONFIRMED, SUGGEST FOLLOW UP TESTING OF ANTITHROMBIN III LEVELS.   CBC     Status: None   Collection Time: 09/15/17  5:18 AM  Result Value Ref Range   WBC 6.9 4.0 - 10.5 K/uL   RBC 4.93 4.22 - 5.81 MIL/uL   Hemoglobin 16.2 13.0 - 17.0 g/dL   HCT 47.0 39.0 - 52.0 %   MCV 95.3 78.0 - 100.0 fL   MCH 32.9 26.0 - 34.0 pg   MCHC 34.5 30.0 - 36.0 g/dL   RDW 13.8 11.5 - 15.5 %   Platelets 211 150 - 400 K/uL  Protime-INR     Status: None   Collection Time: 09/15/17  5:18 AM  Result Value Ref Range   Prothrombin Time 14.1 11.4 - 15.2 seconds   INR 1.10     No results found.  Review of Systems  Constitutional: Positive for malaise/fatigue. Negative for chills and fever.  HENT: Negative.   Eyes: Negative.   Respiratory: Positive for shortness of breath.   Cardiovascular: Positive for orthopnea and leg swelling. Negative for chest pain, palpitations and PND.  Gastrointestinal: Negative.   Genitourinary: Negative.   Musculoskeletal: Negative.   Skin: Negative.   Neurological: Negative.   Endo/Heme/Allergies: Negative.   Psychiatric/Behavioral: Negative.    Blood pressure (!) 131/91, pulse 93, temperature 98.9 F (37.2 C), temperature source Oral, resp. rate 14, height 6' (1.829 m), weight 106.6 kg (235 lb 1.6 oz), SpO2 94 %. Physical Exam  Constitutional: He is oriented to person, place, and time. No distress.  Obese gentleman in no distress  HENT:  Head: Normocephalic and atraumatic.  Mouth/Throat: Oropharynx is clear and moist.  Eyes: EOM are normal.  Neck: Normal range of motion. Neck supple. No JVD present. No thyromegaly present.  Cardiovascular: Normal rate, regular rhythm, normal heart sounds and intact distal  pulses.   No murmur heard. Respiratory: Effort normal and breath sounds normal. No respiratory distress. He has no rales.  GI: Soft. Bowel sounds are normal. He exhibits no distension and no mass. There is no tenderness.  Musculoskeletal: Normal range of motion. He exhibits edema.  Lymphadenopathy:    He has no cervical adenopathy.  Neurological: He is alert and oriented to person, place, and time.  Skin: Skin is warm and dry.  Psychiatric: He has a normal mood and affect.        *[facility]*                         [place], [place] [address]                            [phone]  ------------------------------------------------------------------- Transthoracic Echocardiography  Patient:    Jasim, Harari MR #:       [id number] Study Date: 09/14/2017 Gender:     M Age:        36 Height:     182.9 cm Weight:     108.3 kg BSA:  2.38 m^2 Pt. Status: Room:       [place], [facility]  ADMITTING    [facility] Patience  ORDERING     [place], Arshad N  REFERRING    [place], [place] N  ATTENDING    [place], Belkys A  PERFORMING   [facility], Inpatient  cc:  ------------------------------------------------------------------- LV EF: 20% -   25%  ------------------------------------------------------------------- Indications:      CHF - 428.0.  ------------------------------------------------------------------- History:   PMH:   Congestive heart failure.  Risk factors: Hypertension.  ------------------------------------------------------------------- Study Conclusions  - Left ventricle: The cavity size was severely dilated. Wall   thickness was normal. Systolic function was severely reduced. The   estimated ejection fraction was in the range of 20% to 25%.   Diffuse hypokinesis. Doppler parameters are consistent with   restrictive physiology,  indicative of decreased left ventricular   diastolic compliance and/or increased left atrial pressure. - Mitral valve: Calcified annulus. - Left atrium: The atrium was mildly dilated. - Right ventricle: Systolic function was mildly reduced. - Pericardium, extracardiac: There was a left pleural effusion.  Impressions:  - Severe global reduction in LV systolic function; restrictive   filling; severe LVE; probable layered apical thrombus (suggest FU   study with definity to further assess); mild LAE; mild RV   dysfunction. Results discussed with Dr Tyrell Antonio.  ------------------------------------------------------------------- Study data:  No prior study was available for comparison.  Study status:  Routine.  Procedure:  The patient reported no pain pre or post test. Transthoracic echocardiography. Image quality was adequate.  Study completion:  There were no complications. Transthoracic echocardiography.  M-mode, complete 2D, spectral Doppler, and color Doppler.  Birthdate:  Patient birthdate: 17-Apr-1956.  Age:  Patient is 61 yr old.  Sex:  Gender: male. BMI: 32.4 kg/m^2.  Blood pressure:     111/82  Patient status: Inpatient.  Study date:  Study date: 09/14/2017. Study time: 12:07 PM.  Location:  Echo laboratory.  -------------------------------------------------------------------  ------------------------------------------------------------------- Left ventricle:  The cavity size was severely dilated. Wall thickness was normal. Systolic function was severely reduced. The estimated ejection fraction was in the range of 20% to 25%. Diffuse hypokinesis. Doppler parameters are consistent with restrictive physiology, indicative of decreased left ventricular diastolic compliance and/or increased left atrial pressure.  ------------------------------------------------------------------- Aortic valve:   Trileaflet; mildly thickened leaflets. Mobility was not restricted.  Doppler:   Transvalvular velocity was within the normal range. There was no stenosis. There was no regurgitation.   ------------------------------------------------------------------- Aorta:  Aortic root: The aortic root was normal in size.  ------------------------------------------------------------------- Mitral valve:   Calcified annulus. Mobility was not restricted. Doppler:  Transvalvular velocity was within the normal range. There was no evidence for stenosis. There was no regurgitation.    Peak gradient (D): 3 mm Hg.  ------------------------------------------------------------------- Left atrium:  The atrium was mildly dilated.  ------------------------------------------------------------------- Right ventricle:  The cavity size was normal. Systolic function was mildly reduced.  ------------------------------------------------------------------- Pulmonic valve:    Doppler:  Transvalvular velocity was within the normal range. There was no evidence for stenosis. There was trivial regurgitation.  ------------------------------------------------------------------- Tricuspid valve:   Structurally normal valve.    Doppler: Transvalvular velocity was within the normal range. There was trivial regurgitation.  ------------------------------------------------------------------- Right atrium:  The atrium was normal in size.  ------------------------------------------------------------------- Pericardium:  There was no pericardial effusion.  ------------------------------------------------------------------- Systemic veins: Inferior vena cava: The vessel was normal in size.  ------------------------------------------------------------------- Pleura:  There was a left pleural effusion.  -------------------------------------------------------------------  Measurements   Left ventricle                          Value        Reference  LV ID, ED, PLAX chordal         (H)     67     mm     43 - 52  LV ID, ES, PLAX chordal         (H)     60    mm     23 - 38  LV fx shortening, PLAX chordal  (L)     10    %      >=29  LV PW thickness, ED                     12    mm     ----------  IVS/LV PW ratio, ED                     0.92         <=1.3  Stroke volume, 2D                       40    ml     ----------  Stroke volume/bsa, 2D                   17    ml/m^2 ----------  LV e&', lateral                          4.81  cm/s   ----------  LV E/e&', lateral                        19.11        ----------  LV e&', medial                           5.29  cm/s   ----------  LV E/e&', medial                         17.37        ----------  LV e&', average                          5.05  cm/s   ----------  LV E/e&', average                        18.2         ----------    Ventricular septum                      Value        Reference  IVS thickness, ED                       11    mm     ----------    LVOT                                    Value        Reference  LVOT ID, S  22    mm     ----------  LVOT area                               3.8   cm^2   ----------  LVOT peak velocity, S                   62.5  cm/s   ----------  LVOT mean velocity, S                   43.6  cm/s   ----------  LVOT VTI, S                             10.5  cm     ----------  LVOT peak gradient, S                   2     mm Hg  ----------    Aorta                                   Value        Reference  Aortic root ID, ED                      35    mm     ----------    Left atrium                             Value        Reference  LA ID, A-P, ES                          41    mm     ----------  LA ID/bsa, A-P                          1.73  cm/m^2 <=2.2  LA volume, S                            75.1  ml     ----------  LA volume/bsa, S                        31.6  ml/m^2 ----------  LA volume, ES, 1-p A4C                  81.1  ml     ----------  LA volume/bsa, ES, 1-p  A4C              34.1  ml/m^2 ----------  LA volume, ES, 1-p A2C                  65.2  ml     ----------  LA volume/bsa, ES, 1-p A2C              27.4  ml/m^2 ----------    Mitral valve                            Value        Reference  Mitral  E-wave peak velocity             91.9  cm/s   ----------  Mitral A-wave peak velocity             33.4  cm/s   ----------  Mitral deceleration time        (L)     137   ms     150 - 230  Mitral peak gradient, D                 3     mm Hg  ----------  Mitral E/A ratio, peak                  2.8          ----------    Pulmonary arteries                      Value        Reference  PA pressure, S, DP                      21    mm Hg  <=30    Tricuspid valve                         Value        Reference  Tricuspid regurg peak velocity          211   cm/s   ----------  Tricuspid peak RV-RA gradient           18    mm Hg  ----------    Right atrium                            Value        Reference  RA ID, S-I, ES, A4C             (H)     51.3  mm     34 - 49  RA area, ES, A4C                (H)     20.8  cm^2   8.3 - 19.5  RA volume, ES, A/L                      66.8  ml     ----------  RA volume/bsa, ES, A/L                  28.1  ml/m^2 ----------    Systemic veins                          Value        Reference  Estimated CVP                           3     mm Hg  ----------    Right ventricle                         Value        Reference  RV ID, minor axis, ED, A4C base         34    mm     ----------  TAPSE  24.3  mm     ----------  RV pressure, S, DP                      21    mm Hg  <=30  RV s&', lateral, S                       9.58  cm/s   ----------  Legend: (L)  and  (H)  mark values outside specified reference range.  ------------------------------------------------------------------- Prepared and Electronically Authenticated by  Kirk Ruths 2018-10-25T13:20:21  [facility]  Order# [id number]  Reading physician: Larey Dresser, MD Ordering physician: Larey Dresser, MD Study date: 09/15/17  Physicians   Panel Physicians Referring Physician Case Authorizing Physician  Larey Dresser, MD (Primary)    Procedures   RIGHT/LEFT HEART CATH AND CORONARY ANGIOGRAPHY  Conclusion   1. Filling pressures remain mildly elevated.  2. Moderate pulmonary venous hypertension.  3. Preserved cardiac output.  4. Severe 2 vessel coronary disease.  The LCx has diffuse, severe disease.  The proximal LAD is totally occluded with poor collateralization.    I do not think we have many percutaneous options.  The LCx has severe disease in several areas and I suspect that the LAD is a chronic total occlusion with poor collateralization.  I will have TCTS see him, but suspect he will also be a poor CABG candidate due to poor LAD target.    Procedural Details/Technique   Technical Details Procedure: Right Heart Cath, Left Heart Cath, Selective Coronary Angiography  Indication: Cardiomyopathy of uncertain etiology.   Procedural Details: The right radial and brachial areas were prepped, draped, and anesthetized with 1% lidocaine. There was a pre-existing peripheral IV in the right brachial area that was replaced with a 28F venous sheath. A Swan-Ganz catheter was used for the right heart catheterization. Standard protocol was followed for recording of right heart pressures and sampling of oxygen saturations. Fick cardiac output was calculated. The right radial artery was entered using modified Seldinger technique and a 53F sheath was placed. The patient received 3 mg IA verapamil and weight-based IV heparin. Standard Judkins catheters were used for selective coronary angiography. There were no immediate procedural complications. The patient was transferred to the post catheterization recovery area for further monitoring.   Estimated blood loss <50 mL.  During  this procedure the patient was administered the following to achieve and maintain moderate conscious sedation: Versed 1 mg, Fentanyl 25 mcg, while the patient's heart rate, blood pressure, and oxygen saturation were continuously monitored. The period of conscious sedation was 38 minutes, of which I was present face-to-face 100% of this time.    Coronary Findings   Dominance: Right  Left Main  No significant disease.  Left Anterior Descending  40% followed by 50% proximal LAD stenoses. There is a large D1 with mild diffuse disease throughout. The proximal LAD is totally occluded just after D1 take-off. There is some collateralization of the mid to distal LAD but not much.  Left Circumflex  Extensive disease in the LCx system. 40% ostial LCx. There is a medium-sized high OM1 with luminal irregularities. There is a complex 95% stenosis in the mid LCx followed by post-stenotic dilatation and another 95% stenosis. There is a small OM2 that originates from the mid LCx and has 95% ostial stenosis. There is mild to moderate diffuse disease in the distal LCx which supplies a medium-sized  PLOM.  Right Coronary Artery  Diffuse mild disease in the RCA and PDA/PLV. 40% stenosis proximal RCA, 30% mid RCA, 40% distal RCA.  Right Heart   Right Heart Pressures RHC Procedural Findings: Hemodynamics (mmHg) RA mean 8 RV 50/7 PA 54/23, mean 34 PCWP mean 22 LV 121/28 AO 123/87  Oxygen saturations: PA 69% AO 92%  Cardiac Output (Fick) 6.01  Cardiac Index (Fick) 2.62 PVR 2 WU    Implants     No implant documentation for this case.  MERGE Images   Show images for CARDIAC CATHETERIZATION   Link to Procedure Log   Procedure Log    Hemo Data    Most Recent Value  Fick Cardiac Output 6.01 L/min  Fick Cardiac Output Index 2.62 (L/min)/BSA  RA A Wave 11 mmHg  RA V Wave 8 mmHg  RA Mean 8 mmHg  RV Systolic Pressure 50 mmHg  RV Diastolic Pressure 0 mmHg  RV EDP 7 mmHg  PA Systolic Pressure 55  mmHg  PA Diastolic Pressure 23 mmHg  PA Mean 35 mmHg  PW A Wave 28 mmHg  PW V Wave 22 mmHg  PW Mean 22 mmHg  AO Systolic Pressure 183 mmHg  AO Diastolic Pressure 87 mmHg  AO Mean 437 mmHg  LV Systolic Pressure 357 mmHg  LV Diastolic Pressure 11 mmHg  LV EDP 28 mmHg  Arterial Occlusion Pressure Extended Systolic Pressure 897 mmHg  Arterial Occlusion Pressure Extended Diastolic Pressure 83 mmHg  Arterial Occlusion Pressure Extended Mean Pressure 99 mmHg  Left Ventricular Apex Extended Systolic Pressure 847 mmHg  Left Ventricular Apex Extended Diastolic Pressure 12 mmHg  Left Ventricular Apex Extended EDP Pressure 29 mmHg  QP/QS 1  TPVR Index 13.34 HRUI  TSVR Index 39.24 HRUI  PVR SVR Ratio 0.14  TPVR/TSVR Ratio 0.34    Assessment/Plan:  He has severe multi-vessel CAD with severe LV systolic dysfunction and an EF of 20-25%, almost 7 cm LV with an apical thrombus and presented with progressive edema and shortness of breath, orthopnea, NYHA class IV, with a BNP of 893 consistent with acute combined  systolic and diastolic congestive heart failure. I have personally reviewed and interpreted his cath films and his LAD is occluded, small, diffusely diseased and not graftable. The diagonal is large and has no significant stenosis and supplies the anterolateral wall. The OM1 is fine. OM2 is small. The distal PL is compromised by the LCX stenoses but could probably be treated with PCI and certainly would not do CABG just for this vessel. The RCA is large and has no significant stenosis. His LV dysfunction almost seems out of proportion to the degree of coronary disease. He had a cardiac MR to look for viability but results are pending and that would not change my assessment that this is non-surgical coronary disease. I would consider LCX PCI or just medical therapy of his heart failure. I discussed the cath and echo results with him and his wife and answered their questions.   I spent 45 minutes  performing this consultation and > 50% of this time was spent face to face counseling and coordinating the care of this patient's severe multi-vessel coronary artery disease and ischemic cardiomyopathy.  Gaye Pollack 09/15/2017, 12:42 PM

## 2017-09-15 NOTE — Progress Notes (Signed)
PT Cancellation Note  Patient Details Name: Shawn Webster MRN: [id number] DOB: [birth year]   Cancelled Treatment:    Reason Eval/Treat Not Completed: Fatigue/lethargy limiting ability to participate   [name] 09/15/2017, 3:34 PM Taylon Louison,PT Acute Rehabilitation [phone] (pager)

## 2017-09-15 NOTE — Progress Notes (Signed)
ANTICOAGULATION CONSULT NOTE - Follow Up Consult  Pharmacy Consult for Heparin  Indication: Possible apical thrombus  No Known Allergies  Patient Measurements: Height: 6' (182.9 cm) Weight: 235 lb 1.6 oz (106.6 kg) (scale b) IBW/kg (Calculated) : 77.6  Assessment: On heparin for LV thrombus. Cath found severe 2v CAD with LCx  diffuse, severe disease and proximal LAD is totally occluded with poor collateralization.  TCTS consulted. Heparin restarted and now to begin Coumadin bridge. INR was 1.1 today. Hgb and plts wnl.  Goal of Therapy:  Heparin level 0.3-0.7 units/ml  INR 2.0-3.0. Monitor platelets by anticoagulation protocol: Yes   Plan:  Continue heparin gtt at 1,600 units/hr Give Coumadin 7.5mg  PO x 1  Check heparin level tonight Monitor daily INR / heparin level, CBC, s/s of bleed  Enzo Bi, PharmD, [facility] Clinical Pharmacist Pager [phone] 09/15/2017 5:32 PM

## 2017-09-15 NOTE — Progress Notes (Signed)
PROGRESS NOTE    MCKADE GURKA  ZOX:[id number] DOB: 02-Feb-1956 DOA: 09/12/2017 PCP: Renford Dills, MD   Brief Narrative:  Shawn Webster is a 61 y.o. male with history of Bell's palsy affecting the left side of the face with history of alcoholism and tobacco abuse has been experiencing shortness of breath on exertion over the last few days with increasing lower extremity edema.  Denies any chest pain or fever chills productive cough.  No recent travel.  Patient had gone to his PCP and was advised to come to the ER after chest x-ray was showing condition and pleural effusion.  ED Course: In the ER patient's labs reveal elevated BNP.  Chest x-ray shows congestion and bilateral pleural effusion.  Patient on my exam as bilateral lower extremity edema with elevated JVD and mild crepitations on chest exam.  Patient was given Lasix 40 mg IV and admitted for CHF.  Assessment & Plan:   Principal Problem:   CHF (congestive heart failure) (HCC) Active Problems:   Shortness of breath   Acute on chronic combined systolic and diastolic CHF (congestive heart failure) (HCC)   Acute Systolic CHF; ECHO with EF 20 %.  Continue with IV lasix. BID.  Daily weight, strict I and.  Negative 4 L.  Weight; 113---108--106 Cardiology consulted due to new onset HF.  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 Cardiology planning cardiac MRI and CVTS evaluation.  Start statins and aspirin.  Plan to start entresto and spironolactone.   LV Thrombus;  IV heparin to start this afternoon.  Cardiology following.   Acute hypoxic respiratory failure; secondary to HF, pulmonary edema.  Continue with IV lasix.  Check oxygen on ambulation.   Alcohol abuse; CIWA protocol.   Tobacco abuse -advised to quit.  History of gout -no signs of exacerbation.    DVT prophylaxis: Lovenox Code Status: full code.  Family Communication: care discussed with patient.  Disposition Plan: home when  stable.   Consultants:   none   Procedures: none   Antimicrobials: none   Subjective: Denies dyspnea, chest pain   Objective: Vitals:   09/15/17 0925 09/15/17 0945 09/15/17 1000 09/15/17 1300  BP: (!) 131/93 131/88 (!) 131/91 126/90  Pulse: 91 87 93 88  Resp:      Temp:      TempSrc:      SpO2:      Weight:      Height:        Intake/Output Summary (Last 24 hours) at 09/15/17 1650 Last data filed at 09/15/17 1200  Gross per 24 hour  Intake           463.46 ml  Output             2550 ml  Net         -2086.54 ml   Filed Weights   09/13/17 0406 09/14/17 0521 09/15/17 0528  Weight: 113.1 kg (249 lb 4.8 oz) 108.3 kg (238 lb 11.2 oz) 106.6 kg (235 lb 1.6 oz)    Examination:  General exam; NAD Respiratory system: CTA Cardiovascular system: S 1, S 2 rrr Gastrointestinal system: BS present, soft.  Central nervous system: non focal.  Extremities: Symmetric power.  Skin: No rashes, lesions or ulcers    Data Reviewed: I have personally reviewed following labs and imaging studies  CBC:  Recent Labs Lab 09/12/17 1951 09/13/17 0549 09/14/17 0326 09/15/17 0518  WBC 6.7 5.4 5.7 6.9  HGB 15.4 14.6 15.4 16.2  HCT 44.7 43.2 46.4 47.0  MCV 95.3 95.4 95.5 95.3  PLT 237 215 224 211   Basic Metabolic Panel:  Recent Labs Lab 09/12/17 1951 09/13/17 0549 09/14/17 0326 09/15/17 0518  NA 136  --  136 136  K 4.7  --  3.9 3.7  CL 102  --  97* 98*  CO2 24  --  29 26  GLUCOSE 110*  --  117* 126*  BUN 18  --  15 16  CREATININE 0.97 0.94 0.87 0.88  CALCIUM 9.2  --  8.8* 9.0  MG  --  1.7  --   --    GFR: Estimated Creatinine Clearance: 112.6 mL/min (by C-G formula based on SCr of 0.88 mg/dL). Liver Function Tests: No results for input(s): AST, ALT, ALKPHOS, BILITOT, PROT, ALBUMIN in the last 168 hours. No results for input(s): LIPASE, AMYLASE in the last 168 hours. No results for input(s): AMMONIA in the last 168 hours. Coagulation Profile:  Recent  Labs Lab 09/15/17 0518  INR 1.10   Cardiac Enzymes:  Recent Labs Lab 09/12/17 1951 09/13/17 0549  TROPONINI <0.03 0.03*   BNP (last 3 results) No results for input(s): PROBNP in the last 8760 hours. HbA1C: No results for input(s): HGBA1C in the last 72 hours. CBG: No results for input(s): GLUCAP in the last 168 hours. Lipid Profile: No results for input(s): CHOL, HDL, LDLCALC, TRIG, CHOLHDL, LDLDIRECT in the last 72 hours. Thyroid Function Tests:  Recent Labs  09/13/17 0549 09/13/17 1112  TSH 4.681*  --   FREET4  --  1.12  T3FREE  --  2.9   Anemia Panel: No results for input(s): VITAMINB12, FOLATE, FERRITIN, TIBC, IRON, RETICCTPCT in the last 72 hours. Sepsis Labs: No results for input(s): PROCALCITON, LATICACIDVEN in the last 168 hours.  No results found for this or any previous visit (from the past 240 hour(s)).       Radiology Studies: Mr Cardiac Morphology W Wo Contrast  Result Date: 09/15/2017 CLINICAL DATA:  Ischemic cardiomyopathy, assess viability. LV thrombus. EXAM: CARDIAC MRI TECHNIQUE: The patient was scanned on a 1.5 Tesla GE magnet. A dedicated cardiac coil was used. Functional imaging was done using Fiesta sequences. 2,3, and 4 chamber views were done to assess for RWMA's. Modified Simpson's rule using a short axis stack was used to calculate an ejection fraction on a dedicated work [employer]. The patient received 35 cc of Multihance. After 10 minutes inversion recovery sequences were used to assess for infiltration and scar tissue. CONTRAST:  35 cc Multihance FINDINGS: Small right>left pleural effusions. Moderately dilated left ventricle with mild LV hypertrophy. The inferior wall was severely hypokinetic. Severe hypokinesis of the inferoseptal wall. 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 Severe hypokinesis anteroseptal, anterior, and anterolateral walls. There is LV thrombus along the apical septal  wall. Possible small amount of thrombus in the inferolateral aneurysm. Normal right ventricular size with mildly decreased systolic function. Mild left atrial enlargement. Normal right atrial size. Trileaflet aortic valve with no significant regurgitation or stenosis. Mild mitral regurgitation. Delayed enhancement: 1. Inferolateral wall: Full thickness late gadolinium enhancement (LGE) basal to mid inferolateral wall, apical lateral 76-99% wall thickness subendocardial LGE. 2.  Apex:  Full thickness LGE. 3. Anteroseptal wall: 76-99% wall thickness subendocardial LGE in the apical septal wall. Measurements: LVEDV 315 mL LVSV 59 mL LVEF 19% IMPRESSION: 1. Moderately dilated LV with mild LV hypertrophy. 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 2.  LV thrombus noted. 3.  Normal RV size with mildly decreased systolic function. 4. The delayed enhancement pattern suggests that the inferolateral wall would not be expected to improve with revascularization of the left circumflex. There did appear to be significant viability within the LAD territory with the exception of the apex. Dalton Mclean Electronically Signed   By: Marca Anconaalton  Mclean M.D.   On: 09/15/2017 14:52        Scheduled Meds: . aspirin  81 mg Oral Daily  . atorvastatin  80 mg Oral q1800  . folic acid  1 mg Oral Daily  . furosemide  40 mg Intravenous BID  . LORazepam  0-4 mg Oral Q12H  . multivitamin with minerals  1 tablet Oral Daily  . potassium chloride  10 mEq Oral Daily  . [START ON 09/16/2017] sacubitril-valsartan  1 tablet Oral BID  . sodium chloride flush  3 mL Intravenous Q12H  . spironolactone  12.5 mg Oral Daily  . thiamine  100 mg Oral Daily   Or  . thiamine  100 mg Intravenous Daily   Continuous Infusions: . sodium chloride    . heparin       LOS: 1 day    Time spent: 25 minutes.     Alba Coryegalado, Sufian Ravi A, MD [facility] Pager [phone]  If  7PM-7AM, please contact night-coverage [email or web address] Password [email or web address] 09/15/2017, 4:50 PM

## 2017-09-15 NOTE — H&P (View-Only) (Signed)
Primary Physician: Primary Cardiologist:  New  Asked to see by Dr Shawn Webster for CHF  HPI: Patient is a 61 yo with history of EtOH abuse, tobacco abuse and Bells palsy. Presents to [employer] on 10/24 for SOB with exertion and LE edema  No CP  Pt says that initially edema started in LE  Moved up to thighs  Then noted SOB with activity like walking up driveway.    On arrival to ED CXR with evid of volume overload  Started on IV lasix    Echo showed LVEF 20%  Diruesed  Started on lisinopril  Wt down 5 kg    Pt says that  His breathing is better   Still with some edema  FHX positve for CAD in father who also developed CHF  Paternal GM had CAD  Pt drinks 2 martinis and 1 wine per day  Smokes 1 cigar per day       Past Medical History:  Diagnosis Date  . Bell's palsy   . CHF (congestive heart failure) (HCC)   . Hypertension     Medications Prior to Admission  Medication Sig Dispense Refill  . furosemide (LASIX) 40 MG tablet Take 40 mg by mouth daily.    Marland Kitchen lisinopril (PRINIVIL,ZESTRIL) 20 MG tablet Take 20 mg by mouth daily.    . potassium chloride SA (K-DUR,KLOR-CON) 20 MEQ tablet Take 20 mEq by mouth daily.       . folic acid  1 mg Oral Daily  . furosemide  40 mg Intravenous Q12H  . heparin  4,000 Units Intravenous Once  . lisinopril  5 mg Oral Daily  . LORazepam  0-4 mg Oral Q6H   Followed by  . [START ON 09/15/2017] LORazepam  0-4 mg Oral Q12H  . multivitamin with minerals  1 tablet Oral Daily  . potassium chloride  10 mEq Oral Daily  . thiamine  100 mg Oral Daily   Or  . thiamine  100 mg Intravenous Daily    Infusions: . heparin      No Known Allergies  Social History   Social History  . Marital status: Married    Spouse name: N/A  . Number of children: N/A  . Years of education: N/A   Occupational History  . Not on file.   Social History Main Topics  . Smoking status: Current Every Day Smoker    Types: Cigars  . Smokeless tobacco: Never Used   Comment: 1 cigar per day  . Alcohol use Yes     Comment: 2 martinis per day  . Drug use: No  . Sexual activity: Not on file   Other Topics Concern  . Not on file   Social History Narrative  . No narrative on file    Family History  Problem Relation Age of Onset  . Dementia Mother   . CAD Father     REVIEW OF SYSTEMS:  All systems reviewed  Negative to the above problem except as noted above.    PHYSICAL EXAM: Vitals:   09/14/17 0521 09/14/17 1020  BP: 136/86 111/82  Pulse: 87 90  Resp: 18   Temp: 98.4 F (36.9 C)   SpO2: 96% 95%     Intake/Output Summary (Last 24 hours) at 09/14/17 1531 Last data filed at 09/14/17 1451  Gross per 24 hour  Intake              610 ml  Output  4375 ml  Net            -3765 ml    General:  Well appearing. No respiratory difficulty HEENT: normal Neck: supple. no JVD. Carotids 2+ bilat; no bruits. No lymphadenopathy or thryomegaly appreciated. Cor: PMI nondisplaced. Regular rate & rhythm. No rubs, gallops or murmurs. Lungs: clear Abdomen: soft, nontender, nondistended. No hepatosplenomegaly. No bruits or masses. Good bowel sounds. Extremities: no cyanosis, clubbing, rash, edema Neuro: alert & oriented x 3, cranial nerves grossly intact. moves all 4 extremities w/o difficulty. Affect pleasant.  ECG:  SR 93 bpm  Incoomplete RBBB  Poor R wave prgression  Low voltage EKG    Results for orders placed or performed during the hospital encounter of 09/12/17 (from the past 24 hour(s))  Basic metabolic panel     Status: Abnormal   Collection Time: 09/14/17  3:26 AM  Result Value Ref Range   Sodium 136 135 - 145 mmol/L   Potassium 3.9 3.5 - 5.1 mmol/L   Chloride 97 (L) 101 - 111 mmol/L   CO2 29 22 - 32 mmol/L   Glucose, Bld 117 (H) 65 - 99 mg/dL   BUN 15 6 - 20 mg/dL   Creatinine, Ser 0.87 0.61 - 1.24 mg/dL   Calcium 8.8 (L) 8.9 - 10.3 mg/dL   GFR calc non Af Amer >60 >60 mL/min   GFR calc Af Amer >60 >60 mL/min   Anion gap  10 5 - 15  CBC     Status: None   Collection Time: 09/14/17  3:26 AM  Result Value Ref Range   WBC 5.7 4.0 - 10.5 K/uL   RBC 4.86 4.22 - 5.81 MIL/uL   Hemoglobin 15.4 13.0 - 17.0 g/dL   HCT 46.4 39.0 - 52.0 %   MCV 95.5 78.0 - 100.0 fL   MCH 31.7 26.0 - 34.0 pg   MCHC 33.2 30.0 - 36.0 g/dL   RDW 14.1 11.5 - 15.5 %   Platelets 224 150 - 400 K/uL   No results found.   ASSESSMENT:  Pt is a 61 yo who was admitted with SOB  Folund to be in CHF   Echo with svere LV dysfunction, mild RV dysfunction  Limited echo with contrast  Pending to r/o thrombus He has diuresed significantly   REcomm:   R and L heart cath to define anatomy/pressuress  Counselled on tobacco and EtOH Currently on lisinopril  WOuld add b blocker after cath  Continue diuresis.  Plan to switch to Entresto after cath.   Anticoagulation based on echo findings.    Shawn Webster    

## 2017-09-15 NOTE — Progress Notes (Signed)
ANTICOAGULATION CONSULT NOTE - Follow Up Consult  Pharmacy Consult for Heparin  Indication: Possible apical thrombus  No Known Allergies  Patient Measurements: Height: 6' (182.9 cm) Weight: 235 lb 1.6 oz (106.6 kg) (scale b) IBW/kg (Calculated) : 77.6  Vital Signs: Temp: 98.2 F (36.8 C) (10/26 2048) Temp Source: Oral (10/26 2048) BP: 119/87 (10/26 2048) Pulse Rate: 84 (10/26 2048)  Labs:  Recent Labs  09/13/17 0549 09/14/17 0326 09/15/17 0042 09/15/17 0518 09/15/17 2246  HGB 14.6 15.4  --  16.2  --   HCT 43.2 46.4  --  47.0  --   PLT 215 224  --  211  --   LABPROT  --   --   --  14.1  --   INR  --   --   --  1.10  --   HEPARINUNFRC  --   --  0.43 0.40 0.37  CREATININE 0.94 0.87  --  0.88  --   TROPONINI 0.03*  --   --   --   --     Estimated Creatinine Clearance: 112.6 mL/min (by C-G formula based on SCr of 0.88 mg/dL).   Assessment: On heparin for possible apical thrombus, s/p cath today with non-surgical CAD, heparin level is therapeutic x 1 after re-start  Goal of Therapy:  Heparin level 0.3-0.7 units/ml Monitor platelets by anticoagulation protocol: Yes   Plan:  -Cont heparin at 1600 units/hr -Heparin level with AM labs  Abran Duke 09/15/2017,11:40 PM

## 2017-09-16 DIAGNOSIS — I2510 Atherosclerotic heart disease of native coronary artery without angina pectoris: Secondary | ICD-10-CM

## 2017-09-16 DIAGNOSIS — I236 Thrombosis of atrium, auricular appendage, and ventricle as current complications following acute myocardial infarction: Secondary | ICD-10-CM

## 2017-09-16 DIAGNOSIS — I255 Ischemic cardiomyopathy: Secondary | ICD-10-CM

## 2017-09-16 LAB — BASIC METABOLIC PANEL
Anion gap: 13 (ref 5–15)
BUN: 12 mg/dL (ref 6–20)
CO2: 23 mmol/L (ref 22–32)
Calcium: 9 mg/dL (ref 8.9–10.3)
Chloride: 97 mmol/L — ABNORMAL LOW (ref 101–111)
Creatinine, Ser: 0.86 mg/dL (ref 0.61–1.24)
GFR calc Af Amer: >60 mL/min (ref >60)
GFR calc non Af Amer: >60 mL/min (ref >60)
Glucose, Bld: 117 mg/dL — ABNORMAL HIGH (ref 65–99)
Potassium: 3.9 mmol/L (ref 3.5–5.1)
Sodium: 133 mmol/L — ABNORMAL LOW (ref 135–145)

## 2017-09-16 LAB — CBC
HCT: 47.6 % (ref 39.0–52.0)
Hemoglobin: 16.4 g/dL (ref 13.0–17.0)
MCH: 32.7 pg (ref 26.0–34.0)
MCHC: 34.5 g/dL (ref 30.0–36.0)
MCV: 95 fL (ref 78.0–100.0)
Platelets: 215 10*3/uL (ref 150–400)
RBC: 5.01 MIL/uL (ref 4.22–5.81)
RDW: 13.7 % (ref 11.5–15.5)
WBC: 7 10*3/uL (ref 4.0–10.5)

## 2017-09-16 LAB — LIPID PANEL
Cholesterol: 165 mg/dL (ref 0–200)
HDL: 43 mg/dL (ref >40)
LDL Cholesterol: 106 mg/dL — ABNORMAL HIGH (ref 0–99)
Total CHOL/HDL Ratio: 3.8 RATIO
Triglycerides: 79 mg/dL (ref <150)
VLDL: 16 mg/dL (ref 0–40)

## 2017-09-16 LAB — HEPARIN LEVEL (UNFRACTIONATED)
Heparin Unfractionated: <0.1 IU/mL — ABNORMAL LOW (ref 0.30–0.70)
Heparin Unfractionated: 0.64 IU/mL (ref 0.30–0.70)

## 2017-09-16 LAB — PROTIME-INR
INR: 1.01
Prothrombin Time: 13.2 seconds (ref 11.4–15.2)

## 2017-09-16 MED ORDER — WARFARIN SODIUM 7.5 MG PO TABS
7.5000 mg | ORAL_TABLET | Freq: Once | ORAL | Status: AC
  Administered 2017-09-16: 7.5 mg via ORAL
  Filled 2017-09-16: qty 1

## 2017-09-16 MED ORDER — FUROSEMIDE 40 MG PO TABS
40.0000 mg | ORAL_TABLET | Freq: Every day | ORAL | Status: DC
  Administered 2017-09-16 – 2017-09-18 (×3): 40 mg via ORAL
  Filled 2017-09-16 (×3): qty 1

## 2017-09-16 MED ORDER — CARVEDILOL 3.125 MG PO TABS
3.1250 mg | ORAL_TABLET | Freq: Two times a day (BID) | ORAL | Status: DC
  Administered 2017-09-16 – 2017-09-18 (×4): 3.125 mg via ORAL
  Filled 2017-09-16 (×4): qty 1

## 2017-09-16 MED ORDER — POTASSIUM CHLORIDE CRYS ER 20 MEQ PO TBCR
40.0000 meq | EXTENDED_RELEASE_TABLET | Freq: Once | ORAL | Status: AC
  Administered 2017-09-16: 40 meq via ORAL

## 2017-09-16 NOTE — Plan of Care (Signed)
Problem: Pain Managment: Goal: General experience of comfort will improve Outcome: Progressing Denies chest pain or shortness of breath   Problem: Tissue Perfusion: Goal: Risk factors for ineffective tissue perfusion will decrease Outcome: Progressing Maintains oxygen saturation in the 90's on room air   Problem: Fluid Volume: Goal: Ability to maintain a balanced intake and output will improve Outcome: Progressing Continues to diurese

## 2017-09-16 NOTE — Progress Notes (Signed)
Patient stable, no concerns or complaints. Patients respiratory status improving- not wearing oxygen.

## 2017-09-16 NOTE — Progress Notes (Signed)
Patient ambulatory entire length of unit, maintained oxygen saturation 97% on room air.  No c/o of dyspnea

## 2017-09-16 NOTE — Progress Notes (Addendum)
Advanced Heart Failure Rounding Note   Subjective:    Feels great. No dyspnea or CP. Weight down 20 pounds. On heparin for LV thrombus. No bleeding    Objective:   Weight Range:  Vital Signs:   Temp:  [98.2 F (36.8 C)-98.5 F (36.9 C)] 98.5 F (36.9 C) (10/27 [id number]) Pulse Rate:  [83-93] 83 (10/27 0642) Resp:  [14] 14 (10/26 2048) BP: (119-134)/(87-100) 129/90 (10/27 0642) SpO2:  [94 %] 94 % (10/27 0642) Weight:  [104.2 kg (229 lb 12.8 oz)] 104.2 kg (229 lb 12.8 oz) (10/27 0642) Last BM Date: 09/14/17  Weight change: Filed Weights   09/14/17 0521 09/15/17 0528 09/16/17 0642  Weight: 108.3 kg (238 lb 11.2 oz) 106.6 kg (235 lb 1.6 oz) 104.2 kg (229 lb 12.8 oz)    Intake/Output:   Intake/Output Summary (Last 24 hours) at 09/16/17 0834 Last data filed at 09/16/17 0602  Gross per 24 hour  Intake           445.86 ml  Output             3600 ml  Net         -3154.14 ml     Physical Exam: General:  Sitting up in bed No resp difficulty HEENT: normal Neck: supple. JVP flat . Carotids 2+ bilat; no bruits. No lymphadenopathy or thryomegaly appreciated. Cor: PMI nondisplaced. Regular rate & rhythm. No rubs, gallops or murmurs. Lungs: clear Abdomen: obese soft, nontender, nondistended. No hepatosplenomegaly. No bruits or masses. Good bowel sounds. Extremities: no cyanosis, clubbing, rash, edema Neuro: alert & orientedx3, cranial nerves grossly intact. moves all 4 extremities w/o difficulty. Affect pleasant  Telemetry:  NSR 80s. Personally reviewed   Labs: Basic Metabolic Panel:  Recent Labs Lab 09/12/17 1951 09/13/17 0549 09/14/17 0326 09/15/17 0518 09/16/17 0620  NA 136  --  136 136 133*  K 4.7  --  3.9 3.7 3.9  CL 102  --  97* 98* 97*  CO2 24  --  29 26 23   GLUCOSE 110*  --  117* 126* 117*  BUN 18  --  15 16 12   CREATININE 0.97 0.94 0.87 0.88 0.86  CALCIUM 9.2  --  8.8* 9.0 9.0  MG  --  1.7  --   --   --     Liver Function Tests: No results for  input(s): AST, ALT, ALKPHOS, BILITOT, PROT, ALBUMIN in the last 168 hours. No results for input(s): LIPASE, AMYLASE in the last 168 hours. No results for input(s): AMMONIA in the last 168 hours.  CBC:  Recent Labs Lab 09/12/17 1951 09/13/17 0549 09/14/17 0326 09/15/17 0518 09/16/17 0620  WBC 6.7 5.4 5.7 6.9 7.0  HGB 15.4 14.6 15.4 16.2 16.4  HCT 44.7 43.2 46.4 47.0 47.6  MCV 95.3 95.4 95.5 95.3 95.0  PLT 237 215 224 211 215    Cardiac Enzymes:  Recent Labs Lab 09/12/17 1951 09/13/17 0549  TROPONINI <0.03 0.03*    BNP: BNP (last 3 results)  Recent Labs  09/12/17 1951  BNP 893.6*    ProBNP (last 3 results) No results for input(s): PROBNP in the last 8760 hours.    Other results:  Imaging: Mr Cardiac Morphology W Wo Contrast  Result Date: 09/15/2017 CLINICAL DATA:  Ischemic cardiomyopathy, assess viability. LV thrombus. EXAM: CARDIAC MRI TECHNIQUE: The patient was scanned on a 1.5 Tesla GE magnet. A dedicated cardiac coil was used. Functional imaging was done using Fiesta sequences. 2,3, and 4 chamber  views were done to assess for RWMA's. Modified Simpson's rule using a short axis stack was used to calculate an ejection fraction on a dedicated work [employer], [employer]. The patient received 35 cc of Multihance. After 10 minutes inversion recovery sequences were used to assess for infiltration and scar tissue. CONTRAST:  35 cc Multihance FINDINGS: Small right>left pleural effusions. Moderately dilated left ventricle with mild LV hypertrophy. The inferior wall was severely hypokinetic. Severe hypokinesis of the inferoseptal wall. 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 Severe hypokinesis anteroseptal, anterior, and anterolateral walls. There is LV thrombus along the apical septal wall. Possible small amount of thrombus in the inferolateral aneurysm. Normal right ventricular size with mildly decreased systolic function. Mild left  atrial enlargement. Normal right atrial size. Trileaflet aortic valve with no significant regurgitation or stenosis. Mild mitral regurgitation. Delayed enhancement: 1. Inferolateral wall: Full thickness late gadolinium enhancement (LGE) basal to mid inferolateral wall, apical lateral 76-99% wall thickness subendocardial LGE. 2.  Apex:  Full thickness LGE. 3. Anteroseptal wall: 76-99% wall thickness subendocardial LGE in the apical septal wall. Measurements: LVEDV 315 mL LVSV 59 mL LVEF 19% IMPRESSION: 1. Moderately dilated LV with mild LV hypertrophy. 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 2.  LV thrombus noted. 3.  Normal RV size with mildly decreased systolic function. 4. The delayed enhancement pattern suggests that the inferolateral wall would not be expected to improve with revascularization of the left circumflex. There did appear to be significant viability within the LAD territory with the exception of the apex. Dalton Mclean Electronically Signed   By: Marca Ancona M.D.   On: 09/15/2017 14:52      Medications:     Scheduled Medications: . aspirin  81 mg Oral Daily  . atorvastatin  80 mg Oral q1800  . folic acid  1 mg Oral Daily  . furosemide  40 mg Intravenous BID  . LORazepam  0-4 mg Oral Q12H  . multivitamin with minerals  1 tablet Oral Daily  . potassium chloride  10 mEq Oral Daily  . sacubitril-valsartan  1 tablet Oral BID  . sodium chloride flush  3 mL Intravenous Q12H  . spironolactone  12.5 mg Oral Daily  . thiamine  100 mg Oral Daily  . warfarin  7.5 mg Oral ONCE-1800  . Warfarin - Pharmacist Dosing Inpatient   Does not apply q1800     Infusions: . sodium chloride    . heparin 1,600 Units/hr (09/15/17 1710)     PRN Medications:  sodium chloride, acetaminophen **OR** acetaminophen, ondansetron **OR** ondansetron (ZOFRAN) IV, sodium chloride flush   Assessment:   61 yo with minimal past history  was admitted with progressive CHF symptoms found to have EF 20-25% with severe iCM and LV thrombus   Plan/Discussion:    1. Acute systolic CHF: Echo with EF 20-25%.  Ischemic cardiomyopathy based on cath though heavy ETOH may play a role.   - Volume status looks good today. Transition to po lasix 40 daily - increase as needed.  - Start Entresto 24/26 bid  - Continue Spironolactone 12.5 mg daily.  - CO ok on RHC will start low-dose carvedilol 2. CAD: Severe 2 vessel disease.  Diffuse, severely diseased LCx system and chronic total occlusion proximal LAD. I think percutaneous options are going to be limited here, but he also does not appear to have good LAD target for CABG. -Cardiac MRI showed  inferolateral aneurysm.  Inferolateral wall does not appear viable so suspect intervention on LCx would not be helpful.  There does appear to be significant viability in the anterior and anteroseptal walls, only the true apex appears non-viable by delayed enhancement imaging. Dr. Shirlee Latch spoke with Dr. [place] and Dr. Laneta Simmers. Not candidate for CTO of LAD or CABG. Will pursue aggressive medical therapy - Refer CR - Continue statin, ASA 81 daily. No plavix with need for warfarin  3. LV thrombus:  - Continue heparin. Start warfarin per pharmacy 4. Smoking: Smokes cigars, encouraged to quit.  5. ETOH abuse: History of heavy ETOH.  Given MRI findings ETOH likely not cause of cardiomyopathy, but encouraged to cut back.   Length of Stay: 2   Arvilla Meres MD 09/16/2017, 8:34 AM  Advanced Heart Failure Team Pager [phone] (M-F; 7a - 4p)  Please contact [facility] for night-coverage after hours (4p -7a ) and weekends on [email or web address]

## 2017-09-16 NOTE — Progress Notes (Signed)
PROGRESS NOTE    Shawn CrazierMichael A Zuidema  ZOX:[id number] DOB: [birth year] DOA: 09/12/2017 PCP: Renford DillsPolite, Ronald, MD   Brief Narrative:  Shawn CrazierMichael A Kimber is a 61 y.o. male with history of Bell's palsy affecting the left side of the face with history of alcoholism and tobacco abuse has been experiencing shortness of breath on exertion over the last few days with increasing lower extremity edema.  Denies any chest pain or fever chills productive cough.  No recent travel.  Patient had gone to his PCP and was advised to come to the ER after chest x-ray was showing condition and pleural effusion.  ED Course: In the ER patient's labs reveal elevated BNP.  Chest x-ray shows congestion and bilateral pleural effusion.  Patient on my exam as bilateral lower extremity edema with elevated JVD and mild crepitations on chest exam.  Patient was given Lasix 40 mg IV and admitted for CHF.  Assessment & Plan:   Principal Problem:   CHF (congestive heart failure) (HCC) Active Problems:   Shortness of breath   Acute on chronic combined systolic and diastolic CHF (congestive heart failure) (HCC)   Acute Systolic CHF; ECHO with EF 20 %.  Treated initially with  IV lasix. BID. Now on oral lasix.  Daily weight, strict I and.  Negative 4 L.  Weight; 113---108--106--104 Cardiology consulted due to new onset HF.  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 Cardiology planning cardiac MRI and CVTS evaluation.  Continue with statins and aspirin.  Started on entresto and spironolactone.   LV Thrombus;  IV heparin to start this afternoon.  Cardiology following.  Started coumadin.   Acute hypoxic respiratory failure; secondary to HF, pulmonary edema.  Treated with  IV lasix.  Resolved.   Alcohol abuse; CIWA protocol.  No evidence of withdrawal.   Tobacco abuse -advised to quit.  History of gout -no signs of exacerbation.    DVT prophylaxis: Lovenox Code Status: full code.  Family  Communication: care discussed with patient.  Disposition Plan: home when stable.   Consultants:   none   Procedures: none   Antimicrobials: none   Subjective: He is feeling better, denies dyspnea.    Objective: Vitals:   09/15/17 2048 09/16/17 0642 09/16/17 1017 09/16/17 1100  BP: 119/87 129/90 109/75 114/88  Pulse: 84 83 88 (!) 117  Resp: 14   18  Temp: 98.2 F (36.8 C) 98.5 F (36.9 C)  98 F (36.7 C)  TempSrc: Oral Oral  Oral  SpO2: 94% 94% 94% 94%  Weight:  104.2 kg (229 lb 12.8 oz)    Height:        Intake/Output Summary (Last 24 hours) at 09/16/17 1404 Last data filed at 09/16/17 1100  Gross per 24 hour  Intake           685.86 ml  Output             3350 ml  Net         -2664.14 ml   Filed Weights   09/14/17 0521 09/15/17 0528 09/16/17 0642  Weight: 108.3 kg (238 lb 11.2 oz) 106.6 kg (235 lb 1.6 oz) 104.2 kg (229 lb 12.8 oz)    Examination:  General exam; NAD Respiratory system: CTA Cardiovascular system: S 1, S 2 RRR Gastrointestinal system: BS present, soft , nt Central nervous system: no focal.  Extremities: Symmetric power.  Skin: No rashes, lesions or ulcers    Data Reviewed: I have personally reviewed following labs  and imaging studies  CBC:  Recent Labs Lab 09/12/17 1951 09/13/17 0549 09/14/17 0326 09/15/17 0518 09/16/17 0620  WBC 6.7 5.4 5.7 6.9 7.0  HGB 15.4 14.6 15.4 16.2 16.4  HCT 44.7 43.2 46.4 47.0 47.6  MCV 95.3 95.4 95.5 95.3 95.0  PLT 237 215 224 211 215   Basic Metabolic Panel:  Recent Labs Lab 09/12/17 1951 09/13/17 0549 09/14/17 0326 09/15/17 0518 09/16/17 0620  NA 136  --  136 136 133*  K 4.7  --  3.9 3.7 3.9  CL 102  --  97* 98* 97*  CO2 24  --  29 26 23   GLUCOSE 110*  --  117* 126* 117*  BUN 18  --  15 16 12   CREATININE 0.97 0.94 0.87 0.88 0.86  CALCIUM 9.2  --  8.8* 9.0 9.0  MG  --  1.7  --   --   --    GFR: Estimated Creatinine Clearance: 114 mL/min (by C-G formula based on SCr of 0.86  mg/dL). Liver Function Tests: No results for input(s): AST, ALT, ALKPHOS, BILITOT, PROT, ALBUMIN in the last 168 hours. No results for input(s): LIPASE, AMYLASE in the last 168 hours. No results for input(s): AMMONIA in the last 168 hours. Coagulation Profile:  Recent Labs Lab 09/15/17 0518 09/16/17 0620  INR 1.10 1.01   Cardiac Enzymes:  Recent Labs Lab 09/12/17 1951 09/13/17 0549  TROPONINI <0.03 0.03*   BNP (last 3 results) No results for input(s): PROBNP in the last 8760 hours. HbA1C: No results for input(s): HGBA1C in the last 72 hours. CBG: No results for input(s): GLUCAP in the last 168 hours. Lipid Profile:  Recent Labs  09/16/17 0620  CHOL 165  HDL 43  LDLCALC 106*  TRIG 79  CHOLHDL 3.8   Thyroid Function Tests: No results for input(s): TSH, T4TOTAL, FREET4, T3FREE, THYROIDAB in the last 72 hours. Anemia Panel: No results for input(s): VITAMINB12, FOLATE, FERRITIN, TIBC, IRON, RETICCTPCT in the last 72 hours. Sepsis Labs: No results for input(s): PROCALCITON, LATICACIDVEN in the last 168 hours.  No results found for this or any previous visit (from the past 240 hour(s)).       Radiology Studies: Mr Cardiac Morphology W Wo Contrast  Result Date: 09/15/2017 CLINICAL DATA:  Ischemic cardiomyopathy, assess viability. LV thrombus. EXAM: CARDIAC MRI TECHNIQUE: The patient was scanned on a 1.5 Tesla GE magnet. A dedicated cardiac coil was used. Functional imaging was done using Fiesta sequences. 2,3, and 4 chamber views were done to assess for RWMA's. Modified Simpson's rule using a short axis stack was used to calculate an ejection fraction on a dedicated work [employer]. The patient received 35 cc of Multihance. After 10 minutes inversion recovery sequences were used to assess for infiltration and scar tissue. CONTRAST:  35 cc Multihance FINDINGS: Small right>left pleural effusions. Moderately dilated left ventricle with mild LV  hypertrophy. The inferior wall was severely hypokinetic. Severe hypokinesis of the inferoseptal wall. 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 Severe hypokinesis anteroseptal, anterior, and anterolateral walls. There is LV thrombus along the apical septal wall. Possible small amount of thrombus in the inferolateral aneurysm. Normal right ventricular size with mildly decreased systolic function. Mild left atrial enlargement. Normal right atrial size. Trileaflet aortic valve with no significant regurgitation or stenosis. Mild mitral regurgitation. Delayed enhancement: 1. Inferolateral wall: Full thickness late gadolinium enhancement (LGE) basal to mid inferolateral wall, apical lateral 76-99% wall thickness subendocardial LGE.  2.  Apex:  Full thickness LGE. 3. Anteroseptal wall: 76-99% wall thickness subendocardial LGE in the apical septal wall. Measurements: LVEDV 315 mL LVSV 59 mL LVEF 19% IMPRESSION: 1. Moderately dilated LV with mild LV hypertrophy. 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 2.  LV thrombus noted. 3.  Normal RV size with mildly decreased systolic function. 4. The delayed enhancement pattern suggests that the inferolateral wall would not be expected to improve with revascularization of the left circumflex. There did appear to be significant viability within the LAD territory with the exception of the apex. Dalton Mclean Electronically Signed   By: Marca Ancona M.D.   On: 09/15/2017 14:52        Scheduled Meds: . aspirin  81 mg Oral Daily  . atorvastatin  80 mg Oral q1800  . carvedilol  3.125 mg Oral BID WC  . folic acid  1 mg Oral Daily  . furosemide  40 mg Oral Daily  . LORazepam  0-4 mg Oral Q12H  . multivitamin with minerals  1 tablet Oral Daily  . potassium chloride  10 mEq Oral Daily  . sacubitril-valsartan  1 tablet Oral BID  . sodium chloride flush  3 mL Intravenous Q12H  .  spironolactone  12.5 mg Oral Daily  . thiamine  100 mg Oral Daily  . warfarin  7.5 mg Oral ONCE-1800  . Warfarin - Pharmacist Dosing Inpatient   Does not apply q1800   Continuous Infusions: . sodium chloride    . heparin 1,800 Units/hr (09/16/17 0844)     LOS: 2 days    Time spent: 25 minutes.     Alba Cory, MD [facility] Pager [phone]  If 7PM-7AM, please contact night-coverage [email or web address] Password [email or web address] 09/16/2017, 2:04 PM

## 2017-09-16 NOTE — Progress Notes (Signed)
ANTICOAGULATION CONSULT NOTE - Follow Up Consult  Pharmacy Consult for Heparin / Warfarin Indication: Possible apical thrombus  No Known Allergies  Patient Measurements: Height: 6' (182.9 cm) Weight: 229 lb 12.8 oz (104.2 kg) (b scale) IBW/kg (Calculated) : 77.6  Vital Signs: Temp: 98 F (36.7 C) (10/27 1100) Temp Source: Oral (10/27 1100) BP: 114/88 (10/27 1100) Pulse Rate: 77 (10/27 1723)  Labs:  Recent Labs  09/14/17 0326  09/15/17 0518 09/15/17 2246 09/16/17 0620 09/16/17 1635  HGB 15.4  --  16.2  --  16.4  --   HCT 46.4  --  47.0  --  47.6  --   PLT 224  --  211  --  215  --   LABPROT  --   --  14.1  --  13.2  --   INR  --   --  1.10  --  1.01  --   HEPARINUNFRC  --   < > 0.40 0.37 <0.10* 0.64  CREATININE 0.87  --  0.88  --  0.86  --   < > = values in this interval not displayed.  Estimated Creatinine Clearance: 114 mL/min (by C-G formula based on SCr of 0.86 mg/dL).   Assessment: On heparin for possible apical thrombus, s/p cath today with non-surgical CAD. Repeat HL is therapeutic on 1800 units/hr but increased significantly from earlier level. RN reports no s/s of bleeding   Goal of Therapy:  Heparin level 0.3-0.7 units/ml Monitor platelets by anticoagulation protocol: Yes  INR goal 2 to 3   Plan:  Decrease heparin to 1750 units / hr 8 hour heparin level  Vinnie Level, PharmD., BCPS Clinical Pharmacist Pager [phone]

## 2017-09-16 NOTE — Progress Notes (Signed)
ANTICOAGULATION CONSULT NOTE - Follow Up Consult  Pharmacy Consult for Heparin / Warfarin Indication: Possible apical thrombus  No Known Allergies  Patient Measurements: Height: 6' (182.9 cm) Weight: 229 lb 12.8 oz (104.2 kg) (b scale) IBW/kg (Calculated) : 77.6  Vital Signs: Temp: 98.5 F (36.9 C) (10/27 0642) Temp Source: Oral (10/27 0642) BP: 129/90 (10/27 1188) Pulse Rate: 83 (10/27 0642)  Labs:  Recent Labs  09/14/17 0326  09/15/17 0518 09/15/17 2246 09/16/17 0620  HGB 15.4  --  16.2  --  16.4  HCT 46.4  --  47.0  --  47.6  PLT 224  --  211  --  215  LABPROT  --   --  14.1  --  13.2  INR  --   --  1.10  --  1.01  HEPARINUNFRC  --   < > 0.40 0.37 <0.10*  CREATININE 0.87  --  0.88  --  0.86  < > = values in this interval not displayed.  Estimated Creatinine Clearance: 114 mL/min (by C-G formula based on SCr of 0.86 mg/dL).   Assessment: On heparin for possible apical thrombus, s/p cath today with non-surgical CAD Heparin level < 0.10 this AM, no issues per RN INR = 1.01  Goal of Therapy:  Heparin level 0.3-0.7 units/ml Monitor platelets by anticoagulation protocol: Yes  INR goal 2 to 3   Plan:  Increase heparin to 1800 units / hr 8 hour heparin level Warfarin 7.5 mg po x 1 today Daily INR  Thank you Okey Regal, PharmD [phone] 09/16/2017,8:28 AM

## 2017-09-16 NOTE — Evaluation (Signed)
Physical Therapy Evaluation Patient Details Name: Shawn Webster MRN: [id number] DOB: [birth year] Today's Date: 09/16/2017   History of Present Illness  Patient is a 61 yo male admitted 09/12/17 with SOB.  Patient found to have CHF with EF of 20-25%, ICM, CAD with 2 vessel disease, LV thrombus.   PMH:  ETOH use, Bells Palsy, HTN.  Clinical Impression  Patient is functioning at independent level with mobility and gait.  Good balance during gait.  Able to negotiate stairs with supervision.  No further PT needs identified - PT will sign off.    Follow Up Recommendations No PT follow up;Supervision - Intermittent    Equipment Recommendations  None recommended by PT    Recommendations for Other Services       Precautions / Restrictions Precautions Precautions: None Restrictions Weight Bearing Restrictions: No      Mobility  Bed Mobility               General bed mobility comments: Patient in chair as PT entered room.  Transfers Overall transfer level: Independent Equipment used: None                Ambulation/Gait Ambulation/Gait assistance: Independent Ambulation Distance (Feet): 250 Feet Assistive device: None Gait Pattern/deviations: Step-through pattern   Gait velocity interpretation: at or above normal speed for age/gender General Gait Details: Patient with good gait pattern, balance, and speed.  No loss of balance during gait.  Patient with dyspnea of 1/4 during gait.  Stairs Stairs: Yes Stairs assistance: Supervision Stair Management: No rails;Alternating pattern;Forwards Number of Stairs: 3 General stair comments: Patient able to negotiate stairs with no hand rails and supervision for DGI assessment.  Encouraged patient to use rails at home.  Wheelchair Mobility    Modified Rankin (Stroke Patients Only)       Balance Overall balance assessment: Independent                               Standardized Balance  Assessment Standardized Balance Assessment : Dynamic Gait Index   Dynamic Gait Index Level Surface: Normal Change in Gait Speed: Normal Gait with Horizontal Head Turns: Normal Gait with Vertical Head Turns: Normal Gait and Pivot Turn: Normal Step Over Obstacle: Normal Step Around Obstacles: Normal Steps: Normal Total Score: 24       Pertinent Vitals/Pain Pain Assessment: No/denies pain    Home Living Family/patient expects to be discharged to:: Private residence Living Arrangements: Spouse/significant other Available Help at Discharge: Family;Available 24 hours/day (Wife works but can be available if needed) Type of Home: House Home Access: Stairs to enter Entrance Stairs-Rails: Doctor, general practice of Steps: 4 Home Layout: Two level;Able to live on main level with bedroom/bathroom Home Equipment: None      Prior Function Level of Independence: Independent         Comments: Drives     Hand Dominance        Extremity/Trunk Assessment   Upper Extremity Assessment Upper Extremity Assessment: Overall WFL for tasks assessed    Lower Extremity Assessment Lower Extremity Assessment: Overall WFL for tasks assessed       Communication   Communication: No difficulties  Cognition Arousal/Alertness: Awake/alert Behavior During Therapy: WFL for tasks assessed/performed Overall Cognitive Status: Within Functional Limits for tasks assessed  General Comments General comments (skin integrity, edema, etc.): Edema noted BLE's    Exercises     Assessment/Plan    PT Assessment Patent does not need any further PT services  PT Problem List         PT Treatment Interventions      PT Goals (Current goals can be found in the Care Plan section)  Acute Rehab PT Goals Patient Stated Goal: to go home soon PT Goal Formulation: All assessment and education complete, DC therapy    Frequency      Barriers to discharge        Co-evaluation               AM-PAC PT "6 Clicks" Daily Activity  Outcome Measure Difficulty turning over in bed (including adjusting bedclothes, sheets and blankets)?: None Difficulty moving from lying on back to sitting on the side of the bed? : None Difficulty sitting down on and standing up from a chair with arms (e.g., wheelchair, bedside commode, etc,.)?: None Help needed moving to and from a bed to chair (including a wheelchair)?: None Help needed walking in hospital room?: None Help needed climbing 3-5 steps with a railing? : A Little 6 Click Score: 23    End of Session   Activity Tolerance: Patient tolerated treatment well Patient left: in bed;with call bell/phone within reach (sitting EOB for meal)   PT Visit Diagnosis: Muscle weakness (generalized) (M62.81)    Time: [id number] PT Time Calculation (min) (ACUTE ONLY): 15 min   Charges:   PT Evaluation $PT Eval Low Complexity: 1 Low     PT G Codes:        Shawn Webster, PT, [facility] Acute Rehab Services Pager [phone]   Shawn Webster 09/16/2017, 2:34 PM

## 2017-09-17 DIAGNOSIS — I513 Intracardiac thrombosis, not elsewhere classified: Secondary | ICD-10-CM

## 2017-09-17 LAB — BASIC METABOLIC PANEL
Anion gap: 9 (ref 5–15)
BUN: 16 mg/dL (ref 6–20)
CO2: 23 mmol/L (ref 22–32)
Calcium: 8.9 mg/dL (ref 8.9–10.3)
Chloride: 99 mmol/L — ABNORMAL LOW (ref 101–111)
Creatinine, Ser: 0.93 mg/dL (ref 0.61–1.24)
GFR calc Af Amer: >60 mL/min (ref >60)
GFR calc non Af Amer: >60 mL/min (ref >60)
Glucose, Bld: 123 mg/dL — ABNORMAL HIGH (ref 65–99)
Potassium: 4.2 mmol/L (ref 3.5–5.1)
Sodium: 131 mmol/L — ABNORMAL LOW (ref 135–145)

## 2017-09-17 LAB — CBC
HCT: 48.2 % (ref 39.0–52.0)
Hemoglobin: 16.5 g/dL (ref 13.0–17.0)
MCH: 32.3 pg (ref 26.0–34.0)
MCHC: 34.2 g/dL (ref 30.0–36.0)
MCV: 94.3 fL (ref 78.0–100.0)
Platelets: 216 10*3/uL (ref 150–400)
RBC: 5.11 MIL/uL (ref 4.22–5.81)
RDW: 13.4 % (ref 11.5–15.5)
WBC: 6 10*3/uL (ref 4.0–10.5)

## 2017-09-17 LAB — PROTIME-INR
INR: 1.09
Prothrombin Time: 14 seconds (ref 11.4–15.2)

## 2017-09-17 LAB — HEPARIN LEVEL (UNFRACTIONATED)
Heparin Unfractionated: 0.5 IU/mL (ref 0.30–0.70)
Heparin Unfractionated: 0.54 IU/mL (ref 0.30–0.70)

## 2017-09-17 MED ORDER — ENOXAPARIN SODIUM 100 MG/ML ~~LOC~~ SOLN
100.0000 mg | Freq: Two times a day (BID) | SUBCUTANEOUS | Status: DC
  Administered 2017-09-17 – 2017-09-18 (×3): 100 mg via SUBCUTANEOUS
  Filled 2017-09-17 (×3): qty 1

## 2017-09-17 MED ORDER — WARFARIN SODIUM 10 MG PO TABS
10.0000 mg | ORAL_TABLET | Freq: Once | ORAL | Status: AC
  Administered 2017-09-17: 10 mg via ORAL
  Filled 2017-09-17: qty 1

## 2017-09-17 NOTE — Progress Notes (Signed)
ANTICOAGULATION CONSULT NOTE - Follow Up Consult  Pharmacy Consult for Heparin  Indication: Possible apical thrombus  No Known Allergies  Patient Measurements: Height: 6' (182.9 cm) Weight: 229 lb 12.8 oz (104.2 kg) (b scale) IBW/kg (Calculated) : 77.6  Vital Signs: Temp: 98 F (36.7 C) (10/27 2136) Temp Source: Oral (10/27 2136) BP: 107/75 (10/27 2136) Pulse Rate: 82 (10/27 2136)  Labs:  Recent Labs  09/14/17 0326  09/15/17 0518  09/16/17 0620 09/16/17 1635 09/17/17 0049  HGB 15.4  --  16.2  --  16.4  --  16.5  HCT 46.4  --  47.0  --  47.6  --  48.2  PLT 224  --  211  --  215  --  216  LABPROT  --   --  14.1  --  13.2  --  14.0  INR  --   --  1.10  --  1.01  --  1.09  HEPARINUNFRC  --   < > 0.40  < > <0.10* 0.64 0.50  CREATININE 0.87  --  0.88  --  0.86  --   --   < > = values in this interval not displayed.  Estimated Creatinine Clearance: 114 mL/min (by C-G formula based on SCr of 0.86 mg/dL).  Assessment: On heparin for possible apical thrombus, s/p cath with non-surgical CAD, heparin level is therapeutic x 2  Goal of Therapy:  Heparin level 0.3-0.7 units/ml Monitor platelets by anticoagulation protocol: Yes   Plan:  -Cont heparin at 1750 units/hr -Daily CBC/HL  Abran Duke 09/17/2017,1:51 AM

## 2017-09-17 NOTE — Progress Notes (Signed)
Advanced Heart Failure Rounding Note   Subjective:    Feels good. Walking halls. On heparin for LV thrombus. No bleeding. Now on oral diuretics. Weight up 1 pound overnight. No CP or SOB.    Objective:   Weight Range:  Vital Signs:   Temp:  [98 F (36.7 C)-98.2 F (36.8 C)] 98.2 F (36.8 C) (10/28 0700) Pulse Rate:  [77-117] 81 (10/28 0700) Resp:  [18-20] 20 (10/28 0700) BP: (97-114)/(67-88) 97/67 (10/28 0700) SpO2:  [94 %-99 %] 95 % (10/28 0700) Weight:  [104.4 kg (230 lb 3 oz)] 104.4 kg (230 lb 3 oz) (10/28 0723) Last BM Date: 09/16/17  Weight change: Filed Weights   09/15/17 0528 09/16/17 0642 09/17/17 0723  Weight: 106.6 kg (235 lb 1.6 oz) 104.2 kg (229 lb 12.8 oz) 104.4 kg (230 lb 3 oz)    Intake/Output:   Intake/Output Summary (Last 24 hours) at 09/17/17 0818 Last data filed at 09/17/17 0102  Gross per 24 hour  Intake          1263.18 ml  Output             2400 ml  Net         -1136.82 ml     Physical Exam: General:  Sitting up in bed. Well appearing. No resp difficulty HEENT: normal Neck: supple. no JVD. Carotids 2+ bilat; no bruits. No lymphadenopathy or thryomegaly appreciated. Cor: PMI nondisplaced. Regular rate & rhythm. No rubs, gallops or murmurs. Lungs: clear Abdomen: obese soft, nontender, nondistended. No hepatosplenomegaly. No bruits or masses. Good bowel sounds. Extremities: no cyanosis, clubbing, rash, edema Neuro: alert & orientedx3, cranial nerves grossly intact. moves all 4 extremities w/o difficulty. Affect pleasant  Telemetry:  NSR 80s. Personally reviewed   Labs: Basic Metabolic Panel:  Recent Labs Lab 09/12/17 1951 09/13/17 0549 09/14/17 0326 09/15/17 0518 09/16/17 0620 09/17/17 0049  NA 136  --  136 136 133* 131*  K 4.7  --  3.9 3.7 3.9 4.2  CL 102  --  97* 98* 97* 99*  CO2 24  --  29 26 23 23   GLUCOSE 110*  --  117* 126* 117* 123*  BUN 18  --  15 16 12 16   CREATININE 0.97 0.94 0.87 0.88 0.86 0.93  CALCIUM 9.2  --   8.8* 9.0 9.0 8.9  MG  --  1.7  --   --   --   --     Liver Function Tests: No results for input(s): AST, ALT, ALKPHOS, BILITOT, PROT, ALBUMIN in the last 168 hours. No results for input(s): LIPASE, AMYLASE in the last 168 hours. No results for input(s): AMMONIA in the last 168 hours.  CBC:  Recent Labs Lab 09/13/17 0549 09/14/17 0326 09/15/17 0518 09/16/17 0620 09/17/17 0049  WBC 5.4 5.7 6.9 7.0 6.0  HGB 14.6 15.4 16.2 16.4 16.5  HCT 43.2 46.4 47.0 47.6 48.2  MCV 95.4 95.5 95.3 95.0 94.3  PLT 215 224 211 215 216    Cardiac Enzymes:  Recent Labs Lab 09/12/17 1951 09/13/17 0549  TROPONINI <0.03 0.03*    BNP: BNP (last 3 results)  Recent Labs  09/12/17 1951  BNP 893.6*    ProBNP (last 3 results) No results for input(s): PROBNP in the last 8760 hours.    Other results:  Imaging: Mr Cardiac Morphology W Wo Contrast  Result Date: 09/15/2017 CLINICAL DATA:  Ischemic cardiomyopathy, assess viability. LV thrombus. EXAM: CARDIAC MRI TECHNIQUE: The patient was scanned on a  1.5 Tesla GE magnet. A dedicated cardiac coil was used. Functional imaging was done using Fiesta sequences. 2,3, and 4 chamber views were done to assess for RWMA's. Modified Simpson's rule using a short axis stack was used to calculate an ejection fraction on a dedicated work Research officer, trade unionstation using Circle software. The patient received 35 cc of Multihance. After 10 minutes inversion recovery sequences were used to assess for infiltration and scar tissue. CONTRAST:  35 cc Multihance FINDINGS: Small right>left pleural effusions. Moderately dilated left ventricle with mild LV hypertrophy. The inferior wall was severely hypokinetic. Severe hypokinesis of the inferoseptal wall. 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 Severe hypokinesis anteroseptal, anterior, and anterolateral walls. There is LV thrombus along the apical septal wall. Possible small amount of thrombus in the inferolateral  aneurysm. Normal right ventricular size with mildly decreased systolic function. Mild left atrial enlargement. Normal right atrial size. Trileaflet aortic valve with no significant regurgitation or stenosis. Mild mitral regurgitation. Delayed enhancement: 1. Inferolateral wall: Full thickness late gadolinium enhancement (LGE) basal to mid inferolateral wall, apical lateral 76-99% wall thickness subendocardial LGE. 2.  Apex:  Full thickness LGE. 3. Anteroseptal wall: 76-99% wall thickness subendocardial LGE in the apical septal wall. Measurements: LVEDV 315 mL LVSV 59 mL LVEF 19% IMPRESSION: 1. Moderately dilated LV with mild LV hypertrophy. 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 2.  LV thrombus noted. 3.  Normal RV size with mildly decreased systolic function. 4. The delayed enhancement pattern suggests that the inferolateral wall would not be expected to improve with revascularization of the left circumflex. There did appear to be significant viability within the LAD territory with the exception of the apex. Dalton Mclean Electronically Signed   By: Marca Anconaalton  Mclean M.D.   On: 09/15/2017 14:52     Medications:     Scheduled Medications: . aspirin  81 mg Oral Daily  . atorvastatin  80 mg Oral q1800  . carvedilol  3.125 mg Oral BID WC  . folic acid  1 mg Oral Daily  . furosemide  40 mg Oral Daily  . multivitamin with minerals  1 tablet Oral Daily  . potassium chloride  10 mEq Oral Daily  . sacubitril-valsartan  1 tablet Oral BID  . sodium chloride flush  3 mL Intravenous Q12H  . spironolactone  12.5 mg Oral Daily  . thiamine  100 mg Oral Daily  . Warfarin - Pharmacist Dosing Inpatient   Does not apply q1800    Infusions: . sodium chloride    . heparin 1,750 Units/hr (09/16/17 2319)    PRN Medications: sodium chloride, acetaminophen **OR** acetaminophen, ondansetron **OR** ondansetron (ZOFRAN) IV, sodium chloride  flush   Assessment:   61 yo with minimal past history was admitted with progressive CHF symptoms found to have EF 20-25% with severe iCM and LV thrombus   Plan/Discussion:    1. Acute systolic CHF: Echo with EF 20-25%.  Ischemic cardiomyopathy based on cath though heavy ETOH may play a role.   - Volume status looks good today. Continue po lasix 40 daily - may need to increase to bid - Entresto 24/26 bid started 10/27. BP too soft to titrate - Continue Spironolactone 12.5 mg daily.  - Continue low-dose carvedilol 2. CAD: Severe 2 vessel disease.  Diffuse, severely diseased LCx system and chronic total occlusion proximal LAD. I think percutaneous options are going to be limited here, but he also does not  appear to have good LAD target for CABG. -Cardiac MRI showed inferolateral aneurysm.  Inferolateral wall does not appear viable so suspect intervention on LCx would not be helpful.  There does appear to be significant viability in the anterior and anteroseptal walls, only the true apex appears non-viable by delayed enhancement imaging. Dr. Shirlee Latch spoke with Dr. [place] and Dr. Laneta Simmers. Not candidate for CTO of LAD or CABG. Will pursue aggressive medical therapy - CR seeing  - Continue statin, ASA 81 daily. No plavix with need for warfarin  3. LV thrombus:  - Continue heparin. On warfarin. INR 1.09 - Consider possible d/c in am with lovenox 4. Smoking: Smokes cigars, encouraged to quit.  5. ETOH abuse: History of heavy ETOH.  Given MRI findings ETOH likely not cause of cardiomyopathy, but encouraged to cut back.   Length of Stay: 3   Merideth Bosque MD 09/17/2017, 8:18 AM  Advanced Heart Failure Team Pager [phone] (M-F; 7a - 4p)  Please contact [facility] for night-coverage after hours (4p -7a ) and weekends on [email or web address]

## 2017-09-17 NOTE — Progress Notes (Signed)
PROGRESS NOTE    Shawn CrazierMichael A Weigold  WUJ:[id number] DOB: [birth year] DOA: 09/12/2017 PCP: Renford DillsPolite, Ronald, MD   Brief Narrative:  Shawn CrazierMichael A Webster is a 61 y.o. male with history of Bell's palsy affecting the left side of the face with history of alcoholism and tobacco abuse has been experiencing shortness of breath on exertion over the last few days with increasing lower extremity edema.  Denies any chest pain or fever chills productive cough.  No recent travel.  Patient had gone to his PCP and was advised to come to the ER after chest x-ray was showing condition and pleural effusion.  ED Course: In the ER patient's labs reveal elevated BNP.  Chest x-ray shows congestion and bilateral pleural effusion.  Patient on my exam as bilateral lower extremity edema with elevated JVD and mild crepitations on chest exam.  Patient was given Lasix 40 mg IV and admitted for CHF.  Assessment & Plan:   Principal Problem:   CHF (congestive heart failure) (HCC) Active Problems:   Shortness of breath   Acute on chronic combined systolic and diastolic CHF (congestive heart failure) (HCC)   Acute Systolic CHF; ECHO with EF 20 %.  Treated initially with  IV lasix. BID. Now on oral lasix.  Daily weight, strict I and.  Negative 4 L.  Weight; 113---108--106--104 Cardiology consulted due to new onset HF.  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 Cardiology planning cardiac MRI and CVTS evaluation.  Continue with statins and aspirin.  Started on entresto and spironolactone.  Stable.   LV Thrombus;  Will transition to lovenox.  Cardiology following.  Started coumadin.   Acute hypoxic respiratory failure; secondary to HF, pulmonary edema.  Treated with  IV lasix.  Resolved.   Alcohol abuse; CIWA protocol.  No evidence of withdrawal.   Tobacco abuse -advised to quit.  History of gout -no signs of exacerbation.    DVT prophylaxis: Lovenox Code Status: full code.  Family  Communication: care discussed with patient.  Disposition Plan: home tomorrow.   Consultants:   none   Procedures: none   Antimicrobials: none   Subjective: He is feeling well, he has been walking in the hall.  Denies dyspnea.    Objective: Vitals:   09/16/17 2136 09/17/17 0700 09/17/17 0723 09/17/17 0948  BP: 107/75 97/67  108/77  Pulse: 82 81  86  Resp: 18 20    Temp: 98 F (36.7 C) 98.2 F (36.8 C)    TempSrc: Oral Oral    SpO2: 99% 95%  97%  Weight:   104.4 kg (230 lb 3 oz)   Height:        Intake/Output Summary (Last 24 hours) at 09/17/17 1203 Last data filed at 09/17/17 0844  Gross per 24 hour  Intake          1317.18 ml  Output             1900 ml  Net          -582.82 ml   Filed Weights   09/15/17 0528 09/16/17 0642 09/17/17 0723  Weight: 106.6 kg (235 lb 1.6 oz) 104.2 kg (229 lb 12.8 oz) 104.4 kg (230 lb 3 oz)    Examination:  General exam; NAD Respiratory system: CTA Cardiovascular system: S  1, S 2 RRR Gastrointestinal system: BS present. Soft. obese Central nervous system: non focal.  Extremities: Symmetric power.  Skin: No rashes, lesions or ulcers    Data Reviewed: I have personally  reviewed following labs and imaging studies  CBC:  Recent Labs Lab 09/13/17 0549 09/14/17 0326 09/15/17 0518 09/16/17 0620 09/17/17 0049  WBC 5.4 5.7 6.9 7.0 6.0  HGB 14.6 15.4 16.2 16.4 16.5  HCT 43.2 46.4 47.0 47.6 48.2  MCV 95.4 95.5 95.3 95.0 94.3  PLT 215 224 211 215 216   Basic Metabolic Panel:  Recent Labs Lab 09/12/17 1951 09/13/17 0549 09/14/17 0326 09/15/17 0518 09/16/17 0620 09/17/17 0049  NA 136  --  136 136 133* 131*  K 4.7  --  3.9 3.7 3.9 4.2  CL 102  --  97* 98* 97* 99*  CO2 24  --  29 26 23 23   GLUCOSE 110*  --  117* 126* 117* 123*  BUN 18  --  15 16 12 16   CREATININE 0.97 0.94 0.87 0.88 0.86 0.93  CALCIUM 9.2  --  8.8* 9.0 9.0 8.9  MG  --  1.7  --   --   --   --    GFR: Estimated Creatinine Clearance: 105.5  mL/min (by C-G formula based on SCr of 0.93 mg/dL). Liver Function Tests: No results for input(s): AST, ALT, ALKPHOS, BILITOT, PROT, ALBUMIN in the last 168 hours. No results for input(s): LIPASE, AMYLASE in the last 168 hours. No results for input(s): AMMONIA in the last 168 hours. Coagulation Profile:  Recent Labs Lab 09/15/17 0518 09/16/17 0620 09/17/17 0049  INR 1.10 1.01 1.09   Cardiac Enzymes:  Recent Labs Lab 09/12/17 1951 09/13/17 0549  TROPONINI <0.03 0.03*   BNP (last 3 results) No results for input(s): PROBNP in the last 8760 hours. HbA1C: No results for input(s): HGBA1C in the last 72 hours. CBG: No results for input(s): GLUCAP in the last 168 hours. Lipid Profile:  Recent Labs  09/16/17 0620  CHOL 165  HDL 43  LDLCALC 106*  TRIG 79  CHOLHDL 3.8   Thyroid Function Tests: No results for input(s): TSH, T4TOTAL, FREET4, T3FREE, THYROIDAB in the last 72 hours. Anemia Panel: No results for input(s): VITAMINB12, FOLATE, FERRITIN, TIBC, IRON, RETICCTPCT in the last 72 hours. Sepsis Labs: No results for input(s): PROCALCITON, LATICACIDVEN in the last 168 hours.  No results found for this or any previous visit (from the past 240 hour(s)).       Radiology Studies: No results found.      Scheduled Meds: . aspirin  81 mg Oral Daily  . atorvastatin  80 mg Oral q1800  . carvedilol  3.125 mg Oral BID WC  . enoxaparin (LOVENOX) injection  100 mg Subcutaneous BID  . folic acid  1 mg Oral Daily  . furosemide  40 mg Oral Daily  . multivitamin with minerals  1 tablet Oral Daily  . potassium chloride  10 mEq Oral Daily  . sacubitril-valsartan  1 tablet Oral BID  . sodium chloride flush  3 mL Intravenous Q12H  . spironolactone  12.5 mg Oral Daily  . thiamine  100 mg Oral Daily  . warfarin  10 mg Oral ONCE-1800  . Warfarin - Pharmacist Dosing Inpatient   Does not apply q1800   Continuous Infusions: . sodium chloride       LOS: 3 days    Time  spent: 25 minutes.     Alba Cory, MD [facility] Pager [phone]  If 7PM-7AM, please contact night-coverage [email or web address] Password [email or web address] 09/17/2017, 12:03 PM

## 2017-09-17 NOTE — Plan of Care (Signed)
Problem: Education: Goal: Ability to verbalize understanding of medication therapies will improve Outcome: Progressing Education on Lovenox and Coumadin provided by [employer]

## 2017-09-17 NOTE — Progress Notes (Signed)
Patient stable, no concerns or complaints.

## 2017-09-17 NOTE — Progress Notes (Signed)
ANTICOAGULATION CONSULT NOTE - Follow Up Consult  Pharmacy Consult for Heparin / Warfarin Indication: Possible apical thrombus  No Known Allergies  Patient Measurements: Height: 6' (182.9 cm) Weight: 230 lb 3 oz (104.4 kg) IBW/kg (Calculated) : 77.6  Vital Signs: Temp: 98.2 F (36.8 C) (10/28 0700) Temp Source: Oral (10/28 0700) BP: 108/77 (10/28 0948) Pulse Rate: 86 (10/28 0948)  Labs:  Recent Labs  09/15/17 0518  09/16/17 0620 09/16/17 1635 09/17/17 0049 09/17/17 0431  HGB 16.2  --  16.4  --  16.5  --   HCT 47.0  --  47.6  --  48.2  --   PLT 211  --  215  --  216  --   LABPROT 14.1  --  13.2  --  14.0  --   INR 1.10  --  1.01  --  1.09  --   HEPARINUNFRC 0.40  < > <0.10* 0.64 0.50 0.54  CREATININE 0.88  --  0.86  --  0.93  --   < > = values in this interval not displayed.  Estimated Creatinine Clearance: 105.5 mL/min (by C-G formula based on SCr of 0.93 mg/dL).  Assessment: Pt is being treated for apical thrombus and transitioning from heparin to Lovenox to bridge while INR subtherapeutic. Heparin level has been therapeutic during admit with no noted infusion complications. CBC remains stable.   INR= 1.09  Goal of Therapy:  Monitor platelets by anticoagulation protocol: Yes  INR goal 2 to 3   Plan:  D/C heparin infusion  Start Lovenox 100mg  sub-Q BID Warfarin 10.0 mg po x 1 today Daily INR Monitor for s/sx of bleeding  Ruben Im, PharmD Clinical Pharmacist 09/17/2017 10:39 AM

## 2017-09-18 LAB — BASIC METABOLIC PANEL
Anion gap: 6 (ref 5–15)
BUN: 15 mg/dL (ref 6–20)
CO2: 30 mmol/L (ref 22–32)
Calcium: 9.3 mg/dL (ref 8.9–10.3)
Chloride: 99 mmol/L — ABNORMAL LOW (ref 101–111)
Creatinine, Ser: 1.02 mg/dL (ref 0.61–1.24)
GFR calc Af Amer: >60 mL/min (ref >60)
GFR calc non Af Amer: >60 mL/min (ref >60)
Glucose, Bld: 124 mg/dL — ABNORMAL HIGH (ref 65–99)
Potassium: 4.8 mmol/L (ref 3.5–5.1)
Sodium: 135 mmol/L (ref 135–145)

## 2017-09-18 LAB — CBC
HCT: 51 % (ref 39.0–52.0)
Hemoglobin: 17 g/dL (ref 13.0–17.0)
MCH: 32 pg (ref 26.0–34.0)
MCHC: 33.3 g/dL (ref 30.0–36.0)
MCV: 95.9 fL (ref 78.0–100.0)
Platelets: 231 10*3/uL (ref 150–400)
RBC: 5.32 MIL/uL (ref 4.22–5.81)
RDW: 13.8 % (ref 11.5–15.5)
WBC: 6.4 10*3/uL (ref 4.0–10.5)

## 2017-09-18 LAB — PROTIME-INR
INR: 1.11
Prothrombin Time: 14.2 seconds (ref 11.4–15.2)

## 2017-09-18 MED ORDER — WARFARIN SODIUM 10 MG PO TABS: 10.0000 mg | ORAL_TABLET | Freq: Every day | ORAL | Status: DC

## 2017-09-18 MED ORDER — SACUBITRIL-VALSARTAN 24-26 MG PO TABS: 1.0000 | ORAL_TABLET | Freq: Two times a day (BID) | ORAL | 1 refills | Status: DC

## 2017-09-18 MED ORDER — CARVEDILOL 3.125 MG PO TABS: 3.1250 mg | ORAL_TABLET | Freq: Two times a day (BID) | ORAL | 1 refills | Status: DC

## 2017-09-18 MED ORDER — ASPIRIN 81 MG PO CHEW: 81.0000 mg | CHEWABLE_TABLET | Freq: Every day | ORAL | 1 refills | Status: DC

## 2017-09-18 MED ORDER — COUMADIN BOOK
Freq: Once | Status: AC
  Administered 2017-09-18: 15:00:00
  Filled 2017-09-18: qty 1

## 2017-09-18 MED ORDER — WARFARIN SODIUM 2.5 MG PO TABS
12.5000 mg | ORAL_TABLET | Freq: Once | ORAL | Status: AC
  Administered 2017-09-18: 12.5 mg via ORAL
  Filled 2017-09-18: qty 1

## 2017-09-18 MED ORDER — ENOXAPARIN SODIUM 100 MG/ML ~~LOC~~ SOLN: 100.0000 mg | Freq: Two times a day (BID) | SUBCUTANEOUS | 0 refills | Status: DC

## 2017-09-18 MED ORDER — ATORVASTATIN CALCIUM 80 MG PO TABS: 80.0000 mg | ORAL_TABLET | Freq: Every day | ORAL | 2 refills | Status: DC

## 2017-09-18 MED ORDER — SPIRONOLACTONE 25 MG PO TABS
12.5000 mg | ORAL_TABLET | Freq: Every day | ORAL | 0 refills | Status: DC
Start: 2017-09-19 — End: 2017-11-16

## 2017-09-18 MED ORDER — WARFARIN SODIUM 10 MG PO TABS: 10.0000 mg | ORAL_TABLET | Freq: Every day | ORAL | 0 refills | Status: DC

## 2017-09-18 NOTE — Discharge Instructions (Addendum)

## 2017-09-18 NOTE — Progress Notes (Signed)
ANTICOAGULATION CONSULT NOTE - Follow Up Consult  Pharmacy Consult for Heparin> enoxaparin / Warfarin Indication: Possible apical thrombus  No Known Allergies  Patient Measurements: Height: 6' (182.9 cm) Weight: 230 lb 1.6 oz (104.4 kg) (b scale) IBW/kg (Calculated) : 77.6  Vital Signs: Temp: 98.6 F (37 C) (10/29 0619) Temp Source: Oral (10/29 0619) BP: 108/76 (10/29 0826) Pulse Rate: 77 (10/29 0826)  Labs:  Recent Labs  09/16/17 0620 09/16/17 1635 09/17/17 0049 09/17/17 0431 09/18/17 0603  HGB 16.4  --  16.5  --  17.0  HCT 47.6  --  48.2  --  51.0  PLT 215  --  216  --  231  LABPROT 13.2  --  14.0  --  14.2  INR 1.01  --  1.09  --  1.11  HEPARINUNFRC <0.10* 0.64 0.50 0.54  --   CREATININE 0.86  --  0.93  --  1.02    Estimated Creatinine Clearance: 96.2 mL/min (by C-G formula based on SCr of 1.02 mg/dL).  Assessment: Pt is being treated for apical thrombus and transitioning from heparin to Lovenox to bridge while INR subtherapeutic. Lovenox 1mg /kg = 100mg  q12h for normal renal funcion. CBC remains stable.   INR= 1.11 despite 3 doses warfarin  Goal of Therapy:  Monitor platelets by anticoagulation protocol: Yes  INR goal 2 to 3   Plan:  Lovenox 100mg  sub-Q BID x 2 day overlap with INR > 2 in setting of a clot Warfarin 12.5 mg po x 1 today - then 10mg  daily  If dc home today would check INR at [facility] on Friday  Leota Sauers Pharm.D. CPP, BCPS Clinical Pharmacist [phone] 09/18/2017 8:42 AM

## 2017-09-18 NOTE — Progress Notes (Signed)
Advanced Heart Failure Rounding Note   Subjective:    Feeling great today. Walked the [employer] hall (past the elevators) 5 times yesterday without SOB. Denies CP, lightheadedness, or dizziness. No bleeding.   Creatinine 1.02 on po lasix. Potassium trending up. Weight stable.  BP in 90s to low 100s.   INR 1.11. On lovenox for bridge with LV thrombus.   Objective:   Weight Range:  Vital Signs:   Temp:  [97.3 F (36.3 C)-98.6 F (37 C)] 98.6 F (37 C) (10/29 [id number]) Pulse Rate:  [76-86] 77 (10/29 0619) Resp:  [14-20] 16 (10/29 0619) BP: (99-110)/(68-78) 110/78 (10/29 0619) SpO2:  [95 %-97 %] 95 % (10/29 0619) Weight:  [230 lb 1.6 oz (104.4 kg)] 230 lb 1.6 oz (104.4 kg) (10/29 0619) Last BM Date: 09/17/17  Weight change: Filed Weights   09/16/17 0642 09/17/17 0723 09/18/17 0619  Weight: 229 lb 12.8 oz (104.2 kg) 230 lb 3 oz (104.4 kg) 230 lb 1.6 oz (104.4 kg)    Intake/Output:   Intake/Output Summary (Last 24 hours) at 09/18/17 0754 Last data filed at 09/18/17 0618  Gross per 24 hour  Intake             1080 ml  Output             1600 ml  Net             -520 ml     Physical Exam: General: Well appearing. No resp difficulty. HEENT: Normal Neck: Supple. JVP 5-6. Carotids 2+ bilat; no bruits. No thyromegaly or nodule noted. Cor: PMI nondisplaced. RRR, No M/G/R noted Lungs: CTAB, normal effort. Abdomen: Obese, soft, non-tender, non-distended, no HSM. No bruits or masses. +BS  Extremities: No cyanosis, clubbing, or rash. R and LLE no edema.  Neuro: Alert & orientedx3, cranial nerves grossly intact. moves all 4 extremities w/o difficulty. Affect pleasant   Telemetry:  NSR 80s, Personally reviewed.   Labs: Basic Metabolic Panel:  Recent Labs Lab 09/13/17 0549 09/14/17 0326 09/15/17 0518 09/16/17 0620 09/17/17 0049 09/18/17 0603  NA  --  136 136 133* 131* 135  K  --  3.9 3.7 3.9 4.2 4.8  CL  --  97* 98* 97* 99* 99*  CO2  --  29 26 23 23 30   GLUCOSE  --   117* 126* 117* 123* 124*  BUN  --  15 16 12 16 15   CREATININE 0.94 0.87 0.88 0.86 0.93 1.02  CALCIUM  --  8.8* 9.0 9.0 8.9 9.3  MG 1.7  --   --   --   --   --     Liver Function Tests: No results for input(s): AST, ALT, ALKPHOS, BILITOT, PROT, ALBUMIN in the last 168 hours. No results for input(s): LIPASE, AMYLASE in the last 168 hours. No results for input(s): AMMONIA in the last 168 hours.  CBC:  Recent Labs Lab 09/14/17 0326 09/15/17 0518 09/16/17 0620 09/17/17 0049 09/18/17 0603  WBC 5.7 6.9 7.0 6.0 6.4  HGB 15.4 16.2 16.4 16.5 17.0  HCT 46.4 47.0 47.6 48.2 51.0  MCV 95.5 95.3 95.0 94.3 95.9  PLT 224 211 215 216 231    Cardiac Enzymes:  Recent Labs Lab 09/12/17 1951 09/13/17 0549  TROPONINI <0.03 0.03*    BNP: BNP (last 3 results)  Recent Labs  09/12/17 1951  BNP 893.6*    ProBNP (last 3 results) No results for input(s): PROBNP in the last 8760 hours.    Other  results:  Imaging: No results found.   Medications:     Scheduled Medications: . aspirin  81 mg Oral Daily  . atorvastatin  80 mg Oral q1800  . carvedilol  3.125 mg Oral BID WC  . enoxaparin (LOVENOX) injection  100 mg Subcutaneous BID  . folic acid  1 mg Oral Daily  . furosemide  40 mg Oral Daily  . multivitamin with minerals  1 tablet Oral Daily  . potassium chloride  10 mEq Oral Daily  . sacubitril-valsartan  1 tablet Oral BID  . sodium chloride flush  3 mL Intravenous Q12H  . spironolactone  12.5 mg Oral Daily  . thiamine  100 mg Oral Daily  . Warfarin - Pharmacist Dosing Inpatient   Does not apply q1800    Infusions: . sodium chloride      PRN Medications: sodium chloride, acetaminophen **OR** acetaminophen, ondansetron **OR** ondansetron (ZOFRAN) IV, sodium chloride flush   Assessment:   61 yo with minimal past history was admitted with progressive CHF symptoms found to have EF 20-25% with severe iCM and LV thrombus  Plan/Discussion:    1. Acute systolic CHF:  Echo with EF 20-25%.  Ischemic cardiomyopathy based on cath though heavy ETOH may play a role.   - Volume status stable on lasix 40 mg daily. Can increase as needed.   - Entresto 24/26 bid started 10/27. BP too soft to titrate for now, and K trending up.  - Continue Spironolactone 12.5 mg daily.  - Continue coreg 3.125 mg BID.  2. CAD: Severe 2 vessel disease.  Diffuse, severely diseased LCx system and chronic total occlusion proximal LAD. I think percutaneous options are going to be limited here, but he also does not appear to have good LAD target for CABG. -Cardiac MRI showed inferolateral aneurysm.  Inferolateral wall does not appear viable so suspect intervention on LCx would not be helpful.  There does appear to be significant viability in the anterior and anteroseptal walls, only the true apex appears non-viable by delayed enhancement imaging. Dr. Shirlee Latch spoke with Dr. [place] and Dr. Laneta Simmers. Not candidate for CTO of LAD or CABG. Will pursue aggressive medical therapy - CR seeing  - Continue statin, ASA 81 daily. No plavix with need for warfarin  - No s/s of ischemia.    3. LV thrombus:  - Continue Lovenox. On warfarin. INR 1.11. He would like to go home with lovenox. Will discuss with MD and pharmacy.  - Will need close INR follow up.  4. Smoking:  - Smokes cigars, encouraged to quit. No change.  5. ETOH abuse: History of heavy ETOH.  - Given MRI findings ETOH likely not cause of cardiomyopathy, but encouraged to cut back. No change.   Length of Stay: [address]  [place], Malin  09/18/2017, 7:54 AM  Advanced Heart Failure Team Pager [phone] (M-F; 7a - 4p)  Please contact [facility] for night-coverage after hours (4p -7a ) and weekends on [email or web address]  Patient seen with PA, agree with the above note.  I agree that he is stable to go home today on current cardiac meds.  He will need close followup in CHF clinic.  He will need followup in coumadin clinic as he will go on home  Lovenox/warfarin bridge.   Marca Ancona 09/18/2017 12:59 PM

## 2017-09-18 NOTE — Progress Notes (Signed)
Pt walking without CP/SOB long distances. In depth education on HF, daily wts, low sodium, walking daily, smoking, Coumadin, and CRPII. Will send referral to [place] CRPII. Good understanding, very receptive.  [id number] Ethelda Chick CES, ACSM 2:12 PM 09/18/2017

## 2017-09-18 NOTE — Progress Notes (Signed)
Reviewed AVS with patient. Taught lovenox and how to administer. Answered patient's questions. Pt is stable and ready for discharge. Will discharge once I review Coumadin book with patient.

## 2017-09-18 NOTE — Discharge Summary (Signed)
Physician Discharge Summary  Shawn CrazierMichael A Webster ZOX:[id number] DOB: [birth year] DOA: 09/12/2017  PCP: Renford DillsPolite, Ronald, MD  Admit date: 09/12/2017 Discharge date: 09/18/2017  Admitted From: Home  Disposition:  Home   Recommendations for Outpatient Follow-up:  1. Follow up with PCP in 1-2 weeks 2. Please obtain BMP/CBC in one week 3. Needs to follow up with cardiology for further care HF.  4. Follow up INR level, might need further Lovenox prescription depending on INR level.    Discharge Condition: Stable.  CODE STATUS: Full code.  Diet recommendation: Heart Healthy   Brief/Interim Summary: Shawn AppleMichael A Godfreyis a 61 y.o.malewith history of Bell's palsy affecting the left side of the face with history of alcoholism and tobacco abuse has been experiencing shortness of breath on exertion over the last few days with increasing lower extremity edema. Denies any chest pain or fever chills productive cough. No recent travel. Patient had gone to his PCP and was advised to come to the ER after chest x-ray was showing condition and pleural effusion.  ED Course:In the ER patient's labs reveal elevated BNP. Chest x-ray shows congestion and bilateral pleural effusion. Patient on my exam as bilateral lower extremity edema with elevated JVD and mild crepitations on chest exam. Patient was given Lasix 40 mg IV and admitted for CHF.  Assessment & Plan:   Principal Problem:   CHF (congestive heart failure) (HCC) Active Problems:   Shortness of breath   Acute on chronic combined systolic and diastolic CHF (congestive heart failure) (HCC)   Acute Systolic CHF; ECHO with EF 20 %.  Treated initially with  IV lasix. BID. Now on oral lasix.  Negative 10 L.  Weight; 113---108--106--104 Cardiology consulted due to new Diagnosis of HF.  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 No candidate for CABG or CTO.  Continue with statins and aspirin, entresto and spironolactone,  lasix.  Stable.   LV Thrombus;  He was initially on heparin. He was transition to lovenox.  Cardiology following.  Started coumadin. Day 4 overlap. Needs to be on lovenox until INR is at goal.  INR arrange for Friday. He will be discharge on coumadin 10 mg daily and lovenox.   Acute hypoxic respiratory failure; secondary to HF, pulmonary edema.  Treated with  IV lasix.  Resolved.   Alcohol use; CIWA protocol.  No evidence of withdrawal.   Tobacco abuse -advised to quit.  History of gout -no signs of exacerbation.    Discharge Diagnoses:  Principal Problem:   CHF (congestive heart failure) (HCC) Active Problems:   Shortness of breath   Acute on chronic combined systolic and diastolic CHF (congestive heart failure) [facility](HCC)    Discharge Instructions  Discharge Instructions    Diet - low sodium heart healthy    Complete by:  As directed    Increase activity slowly    Complete by:  As directed      Allergies as of 09/18/2017   No Known Allergies     Medication List    STOP taking these medications   lisinopril 20 MG tablet Commonly known as:  PRINIVIL,ZESTRIL   potassium chloride SA 20 MEQ tablet Commonly known as:  K-DUR,KLOR-CON     TAKE these medications   aspirin 81 MG chewable tablet Chew 1 tablet (81 mg total) by mouth daily.   atorvastatin 80 MG tablet Commonly known as:  LIPITOR Take 1 tablet (80 mg total) by mouth daily at 6 PM.   carvedilol 3.125 MG tablet  Commonly known as:  COREG Take 1 tablet (3.125 mg total) by mouth 2 (two) times daily with a meal.   enoxaparin 100 MG/ML injection Commonly known as:  LOVENOX Inject 1 mL (100 mg total) into the skin 2 (two) times daily.   furosemide 40 MG tablet Commonly known as:  LASIX Take 40 mg by mouth daily.   sacubitril-valsartan 24-26 MG Commonly known as:  ENTRESTO Take 1 tablet by mouth 2 (two) times daily.   spironolactone 25 MG tablet Commonly known as:  ALDACTONE Take 0.5  tablets (12.5 mg total) by mouth daily.   warfarin 10 MG tablet Commonly known as:  COUMADIN Take 1 tablet (10 mg total) by mouth daily at 6 PM.      Follow-up Information    [facility] [employer]. Go on 09/22/2017.   Specialty:  Cardiology Why:  10:30 AM, Coumadin Clinic Contact information: [address] [place] [address] [phone]       [facility]. Go on 09/27/2017.   Specialty:  Cardiology Why:  10:30 AM, Advanced Heart Failure Clinic, parking code [id number] Contact information: [address] [id number] mc [place] [address] [phone]         No Known Allergies  Consultations:  Cardiology    Procedures/Studies: Dg Chest 2 View  Result Date: 09/12/2017 CLINICAL DATA:  Shortness of breath. EXAM: CHEST  2 VIEW COMPARISON:  Chest x-ray 12/10/2002 rib. FINDINGS: Mediastinum and hilar structures normal. Cardiomegaly with mild pulmonary vascular prominence, interstitial prominence, and bilateral pleural effusions consistent with CHF. Low lung volumes. IMPRESSION: 1. Mild congestive heart failure with mild pulmonary interstitial edema small pleural effusions. 2. Low lung volumes with basilar atelectasis Electronically Signed   By: Maisie Fus  Register   On: 09/12/2017 11:19   Mr Cardiac Morphology W Wo Contrast  Result Date: 09/15/2017 CLINICAL DATA:  Ischemic cardiomyopathy, assess viability. LV thrombus. EXAM: CARDIAC MRI TECHNIQUE: The patient was scanned on a 1.5 Tesla GE magnet. A dedicated cardiac coil was used. Functional imaging was done using Fiesta sequences. 2,3, and 4 chamber views were done to assess for RWMA's. Modified Simpson's rule using a short axis stack was used to calculate an ejection fraction on a dedicated work [employer]. The patient received 35 cc of Multihance. After 10 minutes inversion recovery sequences were used to assess  for infiltration and scar tissue. CONTRAST:  35 cc Multihance FINDINGS: Small right>left pleural effusions. Moderately dilated left ventricle with mild LV hypertrophy. The inferior wall was severely hypokinetic. Severe hypokinesis of the inferoseptal wall. 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 Severe hypokinesis anteroseptal, anterior, and anterolateral walls. There is LV thrombus along the apical septal wall. Possible small amount of thrombus in the inferolateral aneurysm. Normal right ventricular size with mildly decreased systolic function. Mild left atrial enlargement. Normal right atrial size. Trileaflet aortic valve with no significant regurgitation or stenosis. Mild mitral regurgitation. Delayed enhancement: 1. Inferolateral wall: Full thickness late gadolinium enhancement (LGE) basal to mid inferolateral wall, apical lateral 76-99% wall thickness subendocardial LGE. 2.  Apex:  Full thickness LGE. 3. Anteroseptal wall: 76-99% wall thickness subendocardial LGE in the apical septal wall. Measurements: LVEDV 315 mL LVSV 59 mL LVEF 19% IMPRESSION: 1. Moderately dilated LV with mild LV hypertrophy. 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 2.  LV thrombus noted. 3.  Normal RV  size with mildly decreased systolic function. 4. The delayed enhancement pattern suggests that the inferolateral wall would not be expected to improve with revascularization of the left circumflex. There did appear to be significant viability within the LAD territory with the exception of the apex. Dalton Mclean Electronically Signed   By: Marca Ancona M.D.   On: 09/15/2017 14:52       Subjective: Feeling well, denies dyspnea, chest pain   Discharge Exam: Vitals:   09/18/17 0826 09/18/17 1124  BP: 108/76 96/73  Pulse: 77 78  Resp: (!) 22 (!) 21  Temp:  97.8 F (36.6 C)  SpO2: 96% 98%   Vitals:   09/17/17 2100 09/18/17 0619  09/18/17 0826 09/18/17 1124  BP: 99/68 110/78 108/76 96/73  Pulse: 76 77 77 78  Resp: 14 16 (!) 22 (!) 21  Temp: 97.8 F (36.6 C) 98.6 F (37 C)  97.8 F (36.6 C)  TempSrc: Oral Oral  Oral  SpO2: 95% 95% 96% 98%  Weight:  104.4 kg (230 lb 1.6 oz)    Height:        General: Pt is alert, awake, not in acute distress Cardiovascular: RRR, S1/S2 +, no rubs, no gallops Respiratory: CTA bilaterally, no wheezing, no rhonchi Abdominal: Soft, NT, ND, bowel sounds + Extremities: no edema, no cyanosis    The results of significant diagnostics from this hospitalization (including imaging, microbiology, ancillary and laboratory) are listed below for reference.     Microbiology: No results found for this or any previous visit (from the past 240 hour(s)).   Labs: BNP (last 3 results)  Recent Labs  09/12/17 1951  BNP 893.6*   Basic Metabolic Panel:  Recent Labs Lab 09/13/17 0549 09/14/17 0326 09/15/17 0518 09/16/17 0620 09/17/17 0049 09/18/17 0603  NA  --  136 136 133* 131* 135  K  --  3.9 3.7 3.9 4.2 4.8  CL  --  97* 98* 97* 99* 99*  CO2  --  29 26 23 23 30   GLUCOSE  --  117* 126* 117* 123* 124*  BUN  --  15 16 12 16 15   CREATININE 0.94 0.87 0.88 0.86 0.93 1.02  CALCIUM  --  8.8* 9.0 9.0 8.9 9.3  MG 1.7  --   --   --   --   --    Liver Function Tests: No results for input(s): AST, ALT, ALKPHOS, BILITOT, PROT, ALBUMIN in the last 168 hours. No results for input(s): LIPASE, AMYLASE in the last 168 hours. No results for input(s): AMMONIA in the last 168 hours. CBC:  Recent Labs Lab 09/14/17 0326 09/15/17 0518 09/16/17 0620 09/17/17 0049 09/18/17 0603  WBC 5.7 6.9 7.0 6.0 6.4  HGB 15.4 16.2 16.4 16.5 17.0  HCT 46.4 47.0 47.6 48.2 51.0  MCV 95.5 95.3 95.0 94.3 95.9  PLT 224 211 215 216 231   Cardiac Enzymes:  Recent Labs Lab 09/12/17 1951 09/13/17 0549  TROPONINI <0.03 0.03*   BNP: Invalid input(s): POCBNP CBG: No results for input(s): GLUCAP in the  last 168 hours. D-Dimer No results for input(s): DDIMER in the last 72 hours. Hgb A1c No results for input(s): HGBA1C in the last 72 hours. Lipid Profile  Recent Labs  09/16/17 0620  CHOL 165  HDL 43  LDLCALC 106*  TRIG 79  CHOLHDL 3.8   Thyroid function studies No results for input(s): TSH, T4TOTAL, T3FREE, THYROIDAB in the last 72 hours.  Invalid input(s): FREET3 Anemia work up No results for  input(s): VITAMINB12, FOLATE, FERRITIN, TIBC, IRON, RETICCTPCT in the last 72 hours. Urinalysis No results found for: COLORURINE, APPEARANCEUR, LABSPEC, PHURINE, GLUCOSEU, HGBUR, BILIRUBINUR, KETONESUR, PROTEINUR, UROBILINOGEN, NITRITE, LEUKOCYTESUR Sepsis Labs Invalid input(s): PROCALCITONIN,  WBC,  LACTICIDVEN Microbiology No results found for this or any previous visit (from the past 240 hour(s)).   Time coordinating discharge: Over 30 minutes  SIGNED:   Alba Cory, MD  [facility] 09/18/2017, 11:45 AM Pager   If 7PM-7AM, please contact night-coverage [email or web address] Password [email or web address]

## 2017-09-19 ENCOUNTER — Telehealth (HOSPITAL_COMMUNITY): Payer: Self-pay

## 2017-09-19 NOTE — Telephone Encounter (Signed)
Patients insurance is active with [employer]. This plan is a PPO plan - Rosann Auerbach stated that Cardiac Rehab is not covered under patients insurance. The provider discount will apart of his plan but he will be paying the contracted amount of this program if he does not change his insurance plan.   Will contact patient to go over insurance.

## 2017-09-22 ENCOUNTER — Ambulatory Visit (INDEPENDENT_AMBULATORY_CARE_PROVIDER_SITE_OTHER): Payer: 59 | Admitting: *Deleted

## 2017-09-22 DIAGNOSIS — I513 Intracardiac thrombosis, not elsewhere classified: Secondary | ICD-10-CM | POA: Insufficient documentation

## 2017-09-22 DIAGNOSIS — Z5181 Encounter for therapeutic drug level monitoring: Secondary | ICD-10-CM | POA: Insufficient documentation

## 2017-09-22 DIAGNOSIS — I240 Acute coronary thrombosis not resulting in myocardial infarction: Secondary | ICD-10-CM

## 2017-09-22 LAB — POCT INR: INR: 3.9

## 2017-09-22 NOTE — Patient Instructions (Signed)

## 2017-09-27 ENCOUNTER — Ambulatory Visit (HOSPITAL_COMMUNITY)
Admission: RE | Admit: 2017-09-27 | Discharge: 2017-09-27 | Disposition: A | Discharge status: Home / Self Care | Payer: 59 | Source: Ambulatory Visit | Attending: Internal Medicine | Admitting: Internal Medicine

## 2017-09-27 ENCOUNTER — Encounter (HOSPITAL_COMMUNITY): Payer: 59

## 2017-09-27 VITALS — BP 86/62 | HR 84 | Wt 228.8 lb

## 2017-09-27 DIAGNOSIS — Z87891 Personal history of nicotine dependence: Secondary | ICD-10-CM | POA: Diagnosis not present

## 2017-09-27 DIAGNOSIS — Z7982 Long term (current) use of aspirin: Secondary | ICD-10-CM | POA: Diagnosis not present

## 2017-09-27 DIAGNOSIS — Z7901 Long term (current) use of anticoagulants: Secondary | ICD-10-CM | POA: Diagnosis not present

## 2017-09-27 DIAGNOSIS — I240 Acute coronary thrombosis not resulting in myocardial infarction: Secondary | ICD-10-CM

## 2017-09-27 DIAGNOSIS — F17200 Nicotine dependence, unspecified, uncomplicated: Secondary | ICD-10-CM | POA: Diagnosis not present

## 2017-09-27 DIAGNOSIS — I110 Hypertensive heart disease with heart failure: Secondary | ICD-10-CM | POA: Diagnosis not present

## 2017-09-27 DIAGNOSIS — Z8249 Family history of ischemic heart disease and other diseases of the circulatory system: Secondary | ICD-10-CM | POA: Diagnosis not present

## 2017-09-27 DIAGNOSIS — I513 Intracardiac thrombosis, not elsewhere classified: Secondary | ICD-10-CM | POA: Diagnosis not present

## 2017-09-27 DIAGNOSIS — Z79899 Other long term (current) drug therapy: Secondary | ICD-10-CM | POA: Insufficient documentation

## 2017-09-27 DIAGNOSIS — I2510 Atherosclerotic heart disease of native coronary artery without angina pectoris: Secondary | ICD-10-CM | POA: Insufficient documentation

## 2017-09-27 DIAGNOSIS — F1010 Alcohol abuse, uncomplicated: Secondary | ICD-10-CM

## 2017-09-27 DIAGNOSIS — I5022 Chronic systolic (congestive) heart failure: Secondary | ICD-10-CM | POA: Diagnosis not present

## 2017-09-27 LAB — BASIC METABOLIC PANEL
Anion gap: 7 (ref 5–15)
BUN: 25 mg/dL — ABNORMAL HIGH (ref 6–20)
CO2: 27 mmol/L (ref 22–32)
Calcium: 9.2 mg/dL (ref 8.9–10.3)
Chloride: 100 mmol/L — ABNORMAL LOW (ref 101–111)
Creatinine, Ser: 1.13 mg/dL (ref 0.61–1.24)
GFR calc Af Amer: >60 mL/min (ref >60)
GFR calc non Af Amer: >60 mL/min (ref >60)
Glucose, Bld: 112 mg/dL — ABNORMAL HIGH (ref 65–99)
Potassium: 5 mmol/L (ref 3.5–5.1)
Sodium: 134 mmol/L — ABNORMAL LOW (ref 135–145)

## 2017-09-27 NOTE — Progress Notes (Signed)
PCP: Dr Nehemiah Settle  Primary Cardiologist:Dr Shirlee Latch  HPI: Mr Shawn Webster is a 61 year old with a h/o chronic systolic heart failure (08/2017), LV thrombus on coumadin, CAD 2 vessel disease, and former smoker.   Admitted the end of October with increased dyspnea and fatigue. ECHO showed severely reduced EF ~20%.  LHC showed severe coronary disease. Evaluated by Dr [place] and Dr Laneta Simmers with recommendations for medical management. Started on low dose HF meds.   Today he returns for post HF follow up. Overall feeling fine. Denies SOB/PND/Orthopnea.No Cp. No BRBPR.  Appetite ok. No fever or chills. Weight at home trending down from 230 > 225 pounds. Taking all medications. He is not smoking. Drinks 2 glasses of wine every night. Retired. Lives with his wife. Says he cant afford cardiac rehab.   ECHO 08/2017  EF 20-25%  Diffuse HK, RV mildly reduced.   CMRI 09/15/2017  1. Moderately dilated LV with mild LV hypertrophy. 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 2.  LV thrombus noted. 3.  Normal RV size with mildly decreased systolic function. 4. The delayed enhancement pattern suggests that the inferolateral wall would not be expected to improve with revascularization of the left circumflex. There did appear to be significant viability within the LAD territory with the exception of the apex.  RHC/LHC 09/15/2017  RA mean 8 RV 50/7 PA 54/23, mean 34 PCWP mean 22 LV 121/28 AO 123/87 Oxygen saturations: PA 69% AO 92% Cardiac Output (Fick) 6.01  Cardiac Index (Fick) 2.62 PVR 2 WU LCx has diffuse, severe disease. The proximal LAD is totally occluded with poor collateralization.   ROS: All systems negative except as listed in HPI, PMH and Problem List.  SH:  Social History   Socioeconomic History  . Marital status: Married    Spouse name: Not on file  . Number of children: Not on file  . Years of education: Not on file    . Highest education level: Not on file  Social Needs  . Financial resource strain: Not on file  . Food insecurity - worry: Not on file  . Food insecurity - inability: Not on file  . Transportation needs - medical: Not on file  . Transportation needs - non-medical: Not on file  Occupational History  . Not on file  Tobacco Use  . Smoking status: Current Every Day Smoker    Types: Cigars  . Smokeless tobacco: Never Used  . Tobacco comment: 1 cigar per day  Substance and Sexual Activity  . Alcohol use: Yes    Comment: 2 martinis per day  . Drug use: No  . Sexual activity: Not on file  Other Topics Concern  . Not on file  Social History Narrative  . Not on file    FH:  Family History  Problem Relation Age of Onset  . Dementia Mother   . CAD Father     Past Medical History:  Diagnosis Date  . Bell's palsy   . CHF (congestive heart failure) (HCC)   . Hypertension     Current Outpatient Medications  Medication Sig Dispense Refill  . aspirin 81 MG chewable tablet Chew 1 tablet (81 mg total) by mouth daily. 30 tablet 1  . atorvastatin (LIPITOR) 80 MG tablet Take 1 tablet (80 mg total) by mouth daily at 6 PM. 30 tablet 2  . carvedilol (COREG) 3.125 MG tablet Take 1 tablet (3.125 mg total) by mouth 2 (two) times  daily with a meal. 30 tablet 1  . furosemide (LASIX) 40 MG tablet Take 40 mg by mouth daily.    . sacubitril-valsartan (ENTRESTO) 24-26 MG Take 1 tablet by mouth 2 (two) times daily. 60 tablet 1  . spironolactone (ALDACTONE) 25 MG tablet Take 0.5 tablets (12.5 mg total) by mouth daily. 30 tablet 0  . warfarin (COUMADIN) 10 MG tablet Take 1 tablet (10 mg total) by mouth daily at 6 PM. 30 tablet 0  . enoxaparin (LOVENOX) 100 MG/ML injection Inject 1 mL (100 mg total) into the skin 2 (two) times daily. (Patient not taking: Reported on 09/27/2017) 10 Syringe 0   No current facility-administered medications for this encounter.     Vitals:   09/27/17 1431  BP: (!) 86/62   Pulse: 84  SpO2: 98%  Weight: 228 lb 12.8 oz (103.8 kg)   Filed Weights   09/27/17 1431  Weight: 228 lb 12.8 oz (103.8 kg)   PHYSICAL EXAM:  General:  Well appearing. No resp difficulty. Walked in the clinic with his wife. HEENT: normal Neck: supple. JVP 8-9. Carotids 2+ bilaterally; no bruits. No lymphadenopathy or thryomegaly appreciated. Cor: PMI normal. Regular rate & rhythm. No rubs, gallops or murmurs. Lungs: clear Abdomen: soft, nontender, nondistended. No hepatosplenomegaly. No bruits or masses. Good bowel sounds. Extremities: no cyanosis, clubbing, rash, R and LLE trace edema. edema Neuro: alert & orientedx3, cranial nerves grossly intact. Moves all 4 extremities w/o difficulty. Affect pleasant.   ASSESSMENT & PLAN: 1. Chronic Systolic Heart Failure  ECHO 20-25%. ICM and ETOH may be playing a role. Plan to repeat ECHO in 3 months after HF meds optimized.  NYHA II. Volume status mildly elevated. able. Continue 40 mg po lasix and may take an extra 40 mg of lasix for 3 pound weight gain in 24 hours.   BP soft but he is not symptomatic therefore I will continue current regimen.  Continue low dose coreg, entresto, spiro.  QRS narrow on 10/24 so no role for Biv 2. CAD:  No S/S ischemia Continue statin, bb, aspirin, and coumadin.  He is unable to start cardiac rehab due to cost. I have asked him to walk 30 minutes a day.  3. LV Thrombus On coumadin.  [facility].  4. Smoking  Has not smoked since discharge. Encouraged to stay away from nicotine.  5. ETOH  He is cutting back. Continues to drink wine. Discussed cessation.   Greater than 50% of the 30 minutes visit spent in counseling/coordination of care regarding HF medication, ETOH cessation, the HF plan for return visits. He was also provided with a list cold medications he can take with HF.  Check BMET today. Follow up in 2 weeks.  Sadaf Przybysz NP-C  4:42 PM

## 2017-09-27 NOTE — Patient Instructions (Signed)
Routine lab work today. Will notify you of abnormal results, otherwise no news is good news!  Follow up 2 weeks with Amy Clegg NP-C.  Take all medication as prescribed the day of your appointment. Bring all medications with you to your appointment.  Do the following things EVERYDAY: 1) Weigh yourself in the morning before breakfast. Write it down and keep it in a log. 2) Take your medicines as prescribed 3) Eat low salt foods-Limit salt (sodium) to 2000 mg per day.  4) Stay as active as you can everyday 5) Limit all fluids for the day to less than 2 liters

## 2017-09-27 NOTE — Telephone Encounter (Signed)
Called and spoke with patient in regards to Cardiac Rehab - Patient is interested in the program. I informed him about his insurance with [place]. Patient stated he has a lot going on right now and will figure out insurance. Patient would like a f/u call mid January. Paperwork placed in file cabinet.

## 2017-09-29 ENCOUNTER — Ambulatory Visit (INDEPENDENT_AMBULATORY_CARE_PROVIDER_SITE_OTHER): Payer: 59 | Admitting: Pharmacist

## 2017-09-29 DIAGNOSIS — Z5181 Encounter for therapeutic drug level monitoring: Secondary | ICD-10-CM | POA: Diagnosis not present

## 2017-09-29 DIAGNOSIS — I513 Intracardiac thrombosis, not elsewhere classified: Secondary | ICD-10-CM | POA: Diagnosis not present

## 2017-09-29 DIAGNOSIS — I240 Acute coronary thrombosis not resulting in myocardial infarction: Secondary | ICD-10-CM

## 2017-09-29 LAB — POCT INR: INR: 4.1

## 2017-10-06 ENCOUNTER — Ambulatory Visit (INDEPENDENT_AMBULATORY_CARE_PROVIDER_SITE_OTHER): Payer: 59 | Admitting: *Deleted

## 2017-10-06 DIAGNOSIS — Z5181 Encounter for therapeutic drug level monitoring: Secondary | ICD-10-CM | POA: Diagnosis not present

## 2017-10-06 DIAGNOSIS — I513 Intracardiac thrombosis, not elsewhere classified: Secondary | ICD-10-CM

## 2017-10-06 DIAGNOSIS — I240 Acute coronary thrombosis not resulting in myocardial infarction: Secondary | ICD-10-CM

## 2017-10-06 LAB — POCT INR: INR: 4.1

## 2017-10-06 NOTE — Patient Instructions (Addendum)
Skip today then start taking 1/2 tablet daily except 1 tablet on Wednesdays. Recheck in 10 days. Call with any new medications or concerns, [phone]

## 2017-10-11 ENCOUNTER — Ambulatory Visit (HOSPITAL_COMMUNITY)
Admission: RE | Admit: 2017-10-11 | Discharge: 2017-10-11 | Disposition: A | Discharge status: Home / Self Care | Payer: 59 | Source: Ambulatory Visit | Attending: Internal Medicine | Admitting: Internal Medicine

## 2017-10-11 ENCOUNTER — Encounter (HOSPITAL_COMMUNITY): Payer: Self-pay

## 2017-10-11 VITALS — BP 102/78 | HR 87 | Wt 229.6 lb

## 2017-10-11 DIAGNOSIS — Z79899 Other long term (current) drug therapy: Secondary | ICD-10-CM | POA: Insufficient documentation

## 2017-10-11 DIAGNOSIS — I240 Acute coronary thrombosis not resulting in myocardial infarction: Secondary | ICD-10-CM

## 2017-10-11 DIAGNOSIS — I5022 Chronic systolic (congestive) heart failure: Secondary | ICD-10-CM

## 2017-10-11 DIAGNOSIS — I2510 Atherosclerotic heart disease of native coronary artery without angina pectoris: Secondary | ICD-10-CM

## 2017-10-11 DIAGNOSIS — I513 Intracardiac thrombosis, not elsewhere classified: Secondary | ICD-10-CM | POA: Diagnosis not present

## 2017-10-11 DIAGNOSIS — F1010 Alcohol abuse, uncomplicated: Secondary | ICD-10-CM | POA: Diagnosis not present

## 2017-10-11 DIAGNOSIS — Z7901 Long term (current) use of anticoagulants: Secondary | ICD-10-CM | POA: Insufficient documentation

## 2017-10-11 DIAGNOSIS — Z8249 Family history of ischemic heart disease and other diseases of the circulatory system: Secondary | ICD-10-CM | POA: Diagnosis not present

## 2017-10-11 DIAGNOSIS — Z87891 Personal history of nicotine dependence: Secondary | ICD-10-CM | POA: Diagnosis not present

## 2017-10-11 DIAGNOSIS — I110 Hypertensive heart disease with heart failure: Secondary | ICD-10-CM | POA: Diagnosis not present

## 2017-10-11 DIAGNOSIS — Z7982 Long term (current) use of aspirin: Secondary | ICD-10-CM | POA: Diagnosis not present

## 2017-10-11 MED ORDER — CARVEDILOL 3.125 MG PO TABS: ORAL_TABLET | ORAL | 1 refills | Status: DC

## 2017-10-11 NOTE — Patient Instructions (Signed)
START taking Carvedilol 3.125 mg (1 Tablet) in the AM and 6.25 mg (2 tablets) in the PM.  Follow up with Elizabeth Palau, PharmD in 3 weeks.  Follow up in clinic in 6 weeks.

## 2017-10-11 NOTE — Progress Notes (Signed)
PCP: Dr Nehemiah Settle  Primary Cardiologist:Dr Shirlee Latch  HPI: Shawn Webster is a 61 year old with a h/o chronic systolic heart failure (08/2017), LV thrombus on coumadin, CAD 2 vessel disease, and former smoker.   Admitted the end of October with increased dyspnea and fatigue. ECHO showed severely reduced EF ~20%.  LHC showed severe coronary disease. Evaluated by Dr [place] and Dr Laneta Simmers with recommendations for medical management. Started on low dose HF meds.   Today he returns for HF follow up. Last visit was volume overloaded so lasix was increased for 1 day. Overall feeling great. Denies SOB/PND/Orthopnea. No CP. No bleeding problems. Weight at home 223 pounds. Following a low salt diet. Drinking 2 glasses of wine a day. Taking all medications.    ECHO 08/2017 -EF 20-25%  Diffuse HK, RV mildly reduced.   CMRI 09/15/2017  1. Moderately dilated LV with mild LV hypertrophy. 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 2.  LV thrombus noted. 3.  Normal RV size with mildly decreased systolic function. 4. The delayed enhancement pattern suggests that the inferolateral wall would not be expected to improve with revascularization of the left circumflex. There did appear to be significant viability within the LAD territory with the exception of the apex.  RHC/LHC 09/15/2017  RA mean 8 RV 50/7 PA 54/23, mean 34 PCWP mean 22 LV 121/28 AO 123/87 Oxygen saturations: PA 69% AO 92% Cardiac Output (Fick) 6.01  Cardiac Index (Fick) 2.62 PVR 2 WU LCx has diffuse, severe disease. The proximal LAD is totally occluded with poor collateralization.   ROS: All systems negative except as listed in HPI, PMH and Problem List.  SH:  Social History   Socioeconomic History  . Marital status: Married    Spouse name: Not on file  . Number of children: Not on file  . Years of education: Not on file  . Highest education level: Not on file    Social Needs  . Financial resource strain: Not on file  . Food insecurity - worry: Not on file  . Food insecurity - inability: Not on file  . Transportation needs - medical: Not on file  . Transportation needs - non-medical: Not on file  Occupational History  . Not on file  Tobacco Use  . Smoking status: Former Smoker    Types: Cigars    Last attempt to quit: 08/21/2017    Years since quitting: 0.1  . Smokeless tobacco: Never Used  . Tobacco comment: 1 cigar per day  Substance and Sexual Activity  . Alcohol use: Yes    Comment: 2 martinis per day  . Drug use: No  . Sexual activity: Not on file  Other Topics Concern  . Not on file  Social History Narrative  . Not on file    FH:  Family History  Problem Relation Age of Onset  . Dementia Mother   . CAD Father     Past Medical History:  Diagnosis Date  . Bell's palsy   . CHF (congestive heart failure) (HCC)   . Hypertension     Current Outpatient Medications  Medication Sig Dispense Refill  . aspirin 81 MG chewable tablet Chew 1 tablet (81 mg total) by mouth daily. 30 tablet 1  . atorvastatin (LIPITOR) 80 MG tablet Take 1 tablet (80 mg total) by mouth daily at 6 PM. 30 tablet 2  . carvedilol (COREG) 3.125 MG tablet Take 1 tablet (3.125 mg total) by  mouth 2 (two) times daily with a meal. 30 tablet 1  . furosemide (LASIX) 40 MG tablet Take 40 mg by mouth daily.    . sacubitril-valsartan (ENTRESTO) 24-26 MG Take 1 tablet by mouth 2 (two) times daily. 60 tablet 1  . spironolactone (ALDACTONE) 25 MG tablet Take 0.5 tablets (12.5 mg total) by mouth daily. 30 tablet 0  . warfarin (COUMADIN) 10 MG tablet Take 1 tablet (10 mg total) by mouth daily at 6 PM. 30 tablet 0   No current facility-administered medications for this encounter.     Vitals:   10/11/17 1137  BP: 102/78  Pulse: 87  SpO2: 96%  Weight: 229 lb 9.6 oz (104.1 kg)   Filed Weights   10/11/17 1137  Weight: 229 lb 9.6 oz (104.1 kg)   PHYSICAL  EXAM: General:  Well appearing. No resp difficulty. Walked in the clinic without difficulty.  HEENT: normal Neck: supple. no JVD. Carotids 2+ bilat; no bruits. No lymphadenopathy or thryomegaly appreciated. Cor: PMI nondisplaced. Regular rate & rhythm. No rubs, gallops or murmurs. Lungs: clear Abdomen: soft, nontender, nondistended. No hepatosplenomegaly. No bruits or masses. Good bowel sounds. Extremities: no cyanosis, clubbing, rash, edema Neuro: alert & orientedx3, cranial nerves grossly intact. moves all 4 extremities w/o difficulty. Affect pleasant  ASSESSMENT & PLAN: 1. Chronic Systolic Heart Failure  08/2017 ECHO 20-25%. ICM and ETOH may be playing a role. Plan to repeat ECHO in 3 months after HF meds optimized.  NYHA II. Stable.  Volume status stable. Continue lasix 40 mg dialy with an extra 40 mg as needed for 3 pound weight gain. Continue coreg 3.125 mg in am and increase pm dose to 6.25 mg - Continue low dose entresto 24-26 mg twice a day -Continue 12.5 mg spiro.   QRS narrow on 10/24 so no role for Biv 2. CAD:  No S/S ischemia.  Continue statin, bb, aspirin, and coumadin.  He is unable to start cardiac rehab due to cost. I have asked him to walk 30 minutes a day.  3. LV Thrombus On coumadin.  [facility].  4. Smoking  Has not smoked since discharge. Encouraged to stay away from nicotine.  5. ETOH  . Continues to drink wine. We discussed cessation.    Follow up 2-3 weeks with pharmacy and 6 weeks with APP.     Shawn Winchel NP-C  11:44 AM

## 2017-10-16 ENCOUNTER — Ambulatory Visit (INDEPENDENT_AMBULATORY_CARE_PROVIDER_SITE_OTHER): Payer: 59 | Admitting: *Deleted

## 2017-10-16 DIAGNOSIS — I240 Acute coronary thrombosis not resulting in myocardial infarction: Secondary | ICD-10-CM

## 2017-10-16 DIAGNOSIS — I513 Intracardiac thrombosis, not elsewhere classified: Secondary | ICD-10-CM | POA: Diagnosis not present

## 2017-10-16 DIAGNOSIS — Z5181 Encounter for therapeutic drug level monitoring: Secondary | ICD-10-CM

## 2017-10-16 LAB — POCT INR: INR: 2.3

## 2017-10-23 ENCOUNTER — Other Ambulatory Visit: Payer: Self-pay | Admitting: Internal Medicine

## 2017-10-23 ENCOUNTER — Ambulatory Visit
Admission: RE | Admit: 2017-10-23 | Discharge: 2017-10-23 | Disposition: A | Discharge status: Home / Self Care | Payer: 59 | Source: Ambulatory Visit | Attending: Internal Medicine | Admitting: Internal Medicine

## 2017-10-23 DIAGNOSIS — R198 Other specified symptoms and signs involving the digestive system and abdomen: Secondary | ICD-10-CM

## 2017-10-27 ENCOUNTER — Ambulatory Visit (INDEPENDENT_AMBULATORY_CARE_PROVIDER_SITE_OTHER): Payer: 59 | Admitting: *Deleted

## 2017-10-27 DIAGNOSIS — I513 Intracardiac thrombosis, not elsewhere classified: Secondary | ICD-10-CM

## 2017-10-27 DIAGNOSIS — I240 Acute coronary thrombosis not resulting in myocardial infarction: Secondary | ICD-10-CM

## 2017-10-27 DIAGNOSIS — Z5181 Encounter for therapeutic drug level monitoring: Secondary | ICD-10-CM | POA: Diagnosis not present

## 2017-10-27 LAB — POCT INR: INR: 3.1

## 2017-10-27 NOTE — Patient Instructions (Signed)
Do not take today's dose then continue taking 1/2 tablet daily except 1 tablet on Wednesdays. Recheck in 2 weeks. Call with any new medications or concerns, [phone]

## 2017-10-29 ENCOUNTER — Emergency Department (HOSPITAL_COMMUNITY): Payer: 59

## 2017-10-29 ENCOUNTER — Emergency Department (HOSPITAL_COMMUNITY)
Admission: EM | Admit: 2017-10-29 | Discharge: 2017-10-29 | Disposition: A | Discharge status: Home / Self Care | Payer: 59 | Attending: Emergency Medicine | Admitting: Emergency Medicine

## 2017-10-29 ENCOUNTER — Other Ambulatory Visit: Payer: Self-pay

## 2017-10-29 ENCOUNTER — Encounter (HOSPITAL_COMMUNITY): Payer: Self-pay

## 2017-10-29 DIAGNOSIS — Z7901 Long term (current) use of anticoagulants: Secondary | ICD-10-CM | POA: Diagnosis not present

## 2017-10-29 DIAGNOSIS — R55 Syncope and collapse: Secondary | ICD-10-CM | POA: Insufficient documentation

## 2017-10-29 DIAGNOSIS — F10920 Alcohol use, unspecified with intoxication, uncomplicated: Secondary | ICD-10-CM | POA: Diagnosis present

## 2017-10-29 DIAGNOSIS — Z87891 Personal history of nicotine dependence: Secondary | ICD-10-CM | POA: Diagnosis not present

## 2017-10-29 DIAGNOSIS — I110 Hypertensive heart disease with heart failure: Secondary | ICD-10-CM | POA: Insufficient documentation

## 2017-10-29 DIAGNOSIS — Z79899 Other long term (current) drug therapy: Secondary | ICD-10-CM | POA: Insufficient documentation

## 2017-10-29 DIAGNOSIS — Z7982 Long term (current) use of aspirin: Secondary | ICD-10-CM | POA: Diagnosis not present

## 2017-10-29 DIAGNOSIS — I5043 Acute on chronic combined systolic (congestive) and diastolic (congestive) heart failure: Secondary | ICD-10-CM | POA: Diagnosis not present

## 2017-10-29 LAB — ETHANOL: Alcohol, Ethyl (B): 234 mg/dL — ABNORMAL HIGH (ref <10)

## 2017-10-29 LAB — BASIC METABOLIC PANEL
Anion gap: 13 (ref 5–15)
BUN: 20 mg/dL (ref 6–20)
CO2: 18 mmol/L — ABNORMAL LOW (ref 22–32)
Calcium: 8.4 mg/dL — ABNORMAL LOW (ref 8.9–10.3)
Chloride: 104 mmol/L (ref 101–111)
Creatinine, Ser: 0.96 mg/dL (ref 0.61–1.24)
GFR calc Af Amer: >60 mL/min (ref >60)
GFR calc non Af Amer: >60 mL/min (ref >60)
Glucose, Bld: 131 mg/dL — ABNORMAL HIGH (ref 65–99)
Potassium: 4 mmol/L (ref 3.5–5.1)
Sodium: 135 mmol/L (ref 135–145)

## 2017-10-29 LAB — CBC
HCT: 39.4 % (ref 39.0–52.0)
Hemoglobin: 13 g/dL (ref 13.0–17.0)
MCH: 31 pg (ref 26.0–34.0)
MCHC: 33 g/dL (ref 30.0–36.0)
MCV: 93.8 fL (ref 78.0–100.0)
Platelets: 266 10*3/uL (ref 150–400)
RBC: 4.2 MIL/uL — ABNORMAL LOW (ref 4.22–5.81)
RDW: 13.7 % (ref 11.5–15.5)
WBC: 6.4 10*3/uL (ref 4.0–10.5)

## 2017-10-29 LAB — CBG MONITORING, ED: Glucose-Capillary: 121 mg/dL — ABNORMAL HIGH (ref 65–99)

## 2017-10-29 NOTE — ED Provider Notes (Signed)
[facility] Provider Note   CSN: [id number] Arrival date & time: 10/29/17  0120     History   Chief Complaint Chief Complaint  Patient presents with  . Loss of Consciousness    HPI Shawn Webster is a 61 y.o. male.  HPI  This is a 61 year old male with a history of heart failure, hypertension who presents with fall.  Patient is unable to provide any history surrounding fall.  He states that he woke up on the floor.  He does not have any recollection of what he was doing prior to the fall.  He does report increased alcohol use this evening.  Reports history of daily alcohol use but had stopped after the diagnosis of heart failure.  However, he has progressively begun to drink more daily.  He had several drinks this evening.  He feels this may be related to his inability to provide history.  He woke up on the floor.  He is on blood thinners.  He has no physical complaints at this time.  Denies neck pain, headache, chest pain, shortness of breath, abdominal pain, lower extremity pain.  Past Medical History:  Diagnosis Date  . Bell's palsy   . CHF (congestive heart failure) (HCC)   . Hypertension     Patient Active Problem List   Diagnosis Date Noted  . Encounter for therapeutic drug monitoring 09/22/2017  . LV (left ventricular) mural thrombus without MI 09/22/2017  . Apical mural thrombus 09/22/2017  . Shortness of breath   . Acute on chronic combined systolic and diastolic CHF (congestive heart failure) (HCC)   . CHF (congestive heart failure) (HCC) 09/13/2017    Past Surgical History:  Procedure Laterality Date  . HERNIA REPAIR    . RIGHT/LEFT HEART CATH AND CORONARY ANGIOGRAPHY N/A 09/15/2017   Procedure: RIGHT/LEFT HEART CATH AND CORONARY ANGIOGRAPHY;  Surgeon: Laurey MoraleMcLean, Dalton S, MD;  [facility];  Service: Cardiovascular;  Laterality: N/A;  . ULTRASOUND GUIDANCE FOR VASCULAR ACCESS  09/15/2017   Procedure:  Ultrasound Guidance For Vascular Access;  Surgeon: Laurey MoraleMcLean, Dalton S, MD;  [facility];  Service: Cardiovascular;;       Home Medications    Prior to Admission medications   Medication Sig Start Date End Date Taking? Authorizing Provider  aspirin 81 MG chewable tablet Chew 1 tablet (81 mg total) by mouth daily. 09/19/17  Yes Regalado, Belkys A, MD  atorvastatin (LIPITOR) 80 MG tablet Take 1 tablet (80 mg total) by mouth daily at 6 PM. 09/18/17  Yes Regalado, Belkys A, MD  carvedilol (COREG) 3.125 MG tablet Take 3.125 mg (1 Tablet) in the AM and 6.25 mg (2 Tablets) in the PM. 10/11/17  Yes Clegg, Amy D, NP  furosemide (LASIX) 40 MG tablet Take 40 mg by mouth daily.   Yes [provider]  potassium chloride SA (K-DUR,KLOR-CON) 20 MEQ tablet Take 20 mEq by mouth daily.   Yes [provider]  sacubitril-valsartan (ENTRESTO) 24-26 MG Take 1 tablet by mouth 2 (two) times daily. 09/18/17  Yes Regalado, Belkys A, MD  spironolactone (ALDACTONE) 25 MG tablet Take 0.5 tablets (12.5 mg total) by mouth daily. 09/19/17  Yes Regalado, Belkys A, MD  warfarin (COUMADIN) 10 MG tablet Take 1 tablet (10 mg total) by mouth daily at 6 PM. Patient taking differently: Take 5-10 mg by mouth daily at 6 PM. Take 5 mg everyday except Wednesday take 10 mg 09/19/17  Yes Regalado, Countrywide FinancialBelkys  A, MD    Family History Family History  Problem Relation Age of Onset  . Dementia Mother   . CAD Father     Social History Social History   Tobacco Use  . Smoking status: Former Smoker    Types: Cigars    Last attempt to quit: 08/21/2017    Years since quitting: 0.1  . Smokeless tobacco: Never Used  . Tobacco comment: 1 cigar per day  Substance Use Topics  . Alcohol use: Yes    Comment: 2 martinis per day  . Drug use: No     Allergies   Patient has no known allergies.   Review of Systems Review of Systems  Constitutional: Negative for fever.  Respiratory: Negative for shortness  of breath.   Cardiovascular: Negative for chest pain.  Gastrointestinal: Negative for abdominal pain, nausea and vomiting.  Genitourinary: Negative for dysuria.  Neurological: Positive for syncope. Negative for headaches.  All other systems reviewed and are negative.    Physical Exam Updated Vital Signs BP 109/83   Pulse 81   Temp 97.9 F (36.6 C) (Oral)   Resp 18   Ht 6' (1.829 m)   Wt 107 kg (236 lb)   SpO2 97%   BMI 32.01 kg/m   Physical Exam  Constitutional: He is oriented to person, place, and time. He appears well-developed and well-nourished.  Overweight, no acute distress  HENT:  Head: Normocephalic and atraumatic.  Eyes: Pupils are equal, round, and reactive to light.  Neck: Normal range of motion. Neck supple.  No midline C-spine tenderness to palpation, step-off, or deformity  Cardiovascular: Normal rate, regular rhythm and normal heart sounds.  No murmur heard. Pulmonary/Chest: Effort normal. No respiratory distress.  Abdominal: Soft. Bowel sounds are normal. There is no tenderness. There is no rebound.  Musculoskeletal: He exhibits no edema.  Trace bilateral lower extremity edema, normal range of motion of bilateral hips and knees  Neurological: He is alert and oriented to person, place, and time.  Skin: Skin is warm and dry.  Psychiatric: He has a normal mood and affect.  Nursing note and vitals reviewed.    ED Treatments / Results  Labs (all labs ordered are listed, but only abnormal results are displayed) Labs Reviewed  CBC - Abnormal; Notable for the following components:      Result Value   RBC 4.20 (*)    All other components within normal limits  ETHANOL - Abnormal; Notable for the following components:   Alcohol, Ethyl (B) 234 (*)    All other components within normal limits  BASIC METABOLIC PANEL - Abnormal; Notable for the following components:   CO2 18 (*)    Glucose, Bld 131 (*)    Calcium 8.4 (*)    All other components within normal  limits  CBG MONITORING, ED - Abnormal; Notable for the following components:   Glucose-Capillary 121 (*)    All other components within normal limits  URINALYSIS, ROUTINE W REFLEX MICROSCOPIC    EKG  EKG Interpretation  Date/Time:  Sunday October 29 2017 01:30:51 EST Ventricular Rate:  84 PR Interval:    QRS Duration: 114 QT Interval:  407 QTC Calculation: 482 R Axis:   -45 Text Interpretation:  Sinus rhythm Prolonged PR interval Left atrial enlargement LAD, consider left anterior fascicular block Abnormal R-wave progression, late transition Borderline prolonged QT interval No significant change since last tracing Confirmed by Ross Marcus ([id number]) on 10/29/2017 4:44:37 AM       Radiology  Ct Head Wo Contrast  Result Date: 10/29/2017 CLINICAL DATA:  Unwitnessed fall. EXAM: CT HEAD WITHOUT CONTRAST TECHNIQUE: Contiguous axial images were obtained from the base of the skull through the vertex without intravenous contrast. COMPARISON:  None. FINDINGS: Brain: No subdural, epidural, or subarachnoid hemorrhage. Cerebellum, brainstem, and basal cisterns are normal ventricles and sulci are unremarkable. A lacunar infarct is seen in the left basal ganglia on series 2, image 15 which is remote in appearance. A lacunar infarct in the anterior limb of the right internal capsule is age indeterminate. No acute cortical ischemia or infarct. No mass effect or midline shift. Vascular: Calcified atherosclerosis in the intracranial carotids. Skull: Normal. Negative for fracture or focal lesion. Sinuses/Orbits: No acute finding. Other: None. IMPRESSION: 1. There is an age-indeterminate lacunar infarct in the anterior limb of the right internal capsule. No other acute abnormalities identified. Electronically Signed   By: Gerome Sam III M.D   On: 10/29/2017 02:22    Procedures Procedures (including critical care time)  Medications Ordered in ED Medications - No data to display   Initial  Impression / Assessment and Plan / ED Course  I have reviewed the triage vital signs and the nursing notes.  Pertinent labs & imaging results that were available during my care of the patient were reviewed by me and considered in my medical decision making (see chart for details).  Clinical Course as of Oct 29 552  Wynelle Link Oct 29, 2017  1610 Workup largely reassuring.  Blood alcohol level 234.  Suspect this is why patient has no recollection of events.  No outward signs of trauma.  On repeat evaluation, patient is sleeping and fairly difficult to arouse.  We will allowed to metabolize.  [CH]    Clinical Course User Index [CH] Anayiah Howden, Mayer Masker, MD    Patient presents after waking up on the floor.  Reports fall but cannot provide any history.  Does report increased alcohol use.  EKG is reassuring.  Basic lab work is also reassuring.  Alcohol level is 234.  CT scan shows a prior lacunar infarct.  No acute neurologic deficits on exam.  Feel patient's presentation is likely related to alcohol intoxication.  5:54 AM Patient is now awake and ambulatory.  He is at his baseline.  After history, exam, and medical workup I feel the patient has been appropriately medically screened and is safe for discharge home. Pertinent diagnoses were discussed with the patient. Patient was given return precautions.   Final Clinical Impressions(s) / ED Diagnoses   Final diagnoses:  Alcoholic intoxication without complication [facility])    ED Discharge Orders    None       Wilkie Aye, Mayer Masker, MD 10/29/17 [phone]

## 2017-10-29 NOTE — ED Triage Notes (Signed)
Pt. Via EMS after unwitnessed fall. Pt. Denies LOC. CHF exacerbation recently per Pt.  CBG-168. Pt. On blood thinners. Pt. Denies LOC

## 2017-10-29 NOTE — ED Notes (Signed)
Pt return from MRI via stretcher. 

## 2017-11-01 ENCOUNTER — Ambulatory Visit (HOSPITAL_COMMUNITY)
Admission: RE | Admit: 2017-11-01 | Discharge: 2017-11-01 | Disposition: A | Discharge status: Home / Self Care | Payer: 59 | Source: Ambulatory Visit | Attending: Internal Medicine | Admitting: Internal Medicine

## 2017-11-01 DIAGNOSIS — Z7901 Long term (current) use of anticoagulants: Secondary | ICD-10-CM | POA: Insufficient documentation

## 2017-11-01 DIAGNOSIS — Z87891 Personal history of nicotine dependence: Secondary | ICD-10-CM | POA: Insufficient documentation

## 2017-11-01 DIAGNOSIS — Z79899 Other long term (current) drug therapy: Secondary | ICD-10-CM | POA: Insufficient documentation

## 2017-11-01 DIAGNOSIS — I2510 Atherosclerotic heart disease of native coronary artery without angina pectoris: Secondary | ICD-10-CM | POA: Diagnosis not present

## 2017-11-01 DIAGNOSIS — I82890 Acute embolism and thrombosis of other specified veins: Secondary | ICD-10-CM | POA: Diagnosis not present

## 2017-11-01 DIAGNOSIS — I5022 Chronic systolic (congestive) heart failure: Secondary | ICD-10-CM | POA: Diagnosis present

## 2017-11-01 MED ORDER — CARVEDILOL 6.25 MG PO TABS: 6.2500 mg | ORAL_TABLET | Freq: Two times a day (BID) | ORAL | 5 refills | Status: DC

## 2017-11-01 NOTE — Progress Notes (Signed)
HF MD: [facility]  HPI:  Mr Shawn Webster is a 61 year old Caucasian male with a h/o chronic systolic heart failure (08/2017), LV thrombus on coumadin, CAD 2 vessel disease, and former smoker.   Admitted the end of October with increased dyspnea and fatigue. ECHO showed severely reduced EF ~20%.  LHC showed severe coronary disease. Evaluated by Dr Swazil[place] and Dr Laneta Simmers with recommendations for medical management. Started on low dose HF meds.   Today he returns with his wife for pharmacist-led HF medication titration. At last HF clinic visit on 10/11/17, his carvedilol was increased from 3.125 mg BID to 3.125 mg qam/6.25 mg qpm. Overall feeling great. Denies SOB/PND/Orthopnea. No CP. No bleeding problems. Weight at home stable with 1-2 lb variability.  Following a low salt diet. Taking all medications. Can walk about 1 mile without SOB.     . Shortness of breath/dyspnea on exertion? no  . Orthopnea/PND? no . Edema? Yes - trace on the left leg/bad knee . Lightheadedness/dizziness? no . Daily weights at home? Yes ~226 lb  . Blood pressure/heart rate monitoring at home? No - has BP monitor at home but not measuring regularly . Following low-sodium/fluid-restricted diet? Yes - no salty foods, <2L/day  HF Medications: Carvedilol 3.125 mg QAM/6.25 mg QPM Furosemide 40 mg PO daily KCl 20 mEq PO daily Entresto 24-26 mg PO BID Spironolactone 12.5 mg PO daily   Has the patient been experiencing any side effects to the medications prescribed?  no  Does the patient have any problems obtaining medications due to transportation or finances?   No - [employer] commercial  Understanding of regimen: fair Understanding of indications: fair Potential of compliance: good Patient understands to avoid NSAIDs. Patient understands to avoid decongestants.    Pertinent Lab Values: . 10/29/17: Serum creatinine 0.96, BUN 20, Potassium 4.0, Sodium 135  Vital Signs: . Weight: 227.6 lb (dry weight: 230 lb) . Blood pressure:  98/76 mmHg  . Heart rate: 85 bpm    Assessment: 1. Chronicsystolic CHF (EF 33-43%), due to ICM and ETOH. NYHA class IIsymptoms.  - Volume status stable  - Increase carvedilol to 6.25 mg BID   - Continue furosemide 40 mg daily, KCl 20 meq daily, Entresto 24-26 mg BID and spironolactone 12.5 mg daily  - Further HF med titration limited 2/2 soft BP  - Basic disease state pathophysiology, medication indication, mechanism and side effects reviewed at length with patient and he verbalized understanding 2. CAD:  - No S/S ischemia.  - Continue statin, bb, aspirin, and coumadin.  - He is unable to start cardiac rehab due to cost. I have asked him to walk 30 minutes a day.  3. LV Thrombus - On coumadin.  - INR followed at the [facility] office.  4. Smoking  - Has not smoked since discharge. Encouraged to stay away from nicotine.  5. ETOH  - Continues to drink wine and had recent ED visit for ETOH intoxication - Continue to encourage cessation  Plan: 1) Medication changes: Based on clinical presentation, vital signs and recent labs will increase carvedilol to 6.25 mg BID 2) Labs: PRN (BMET at least q63mo with spiro) 3) Follow-up: PA/NP on 11/27/17   Cicero Duck K. Bonnye Fava, PharmD, BCPS, CPP Clinical Pharmacist Pager: [phone] Phone: [phone] 11/01/2017 2:02 PM

## 2017-11-01 NOTE — Patient Instructions (Signed)
It was great to see you today!  Please INCREASE your carvedilol to 6.25 mg TWICE DAILY. You may take #2 tablets TWICE DAILY of your current 3.125 mg tablets until you fill the higher strength.   Please keep your appointment with Otilio Saber, PA-C on 11/27/17.

## 2017-11-10 ENCOUNTER — Ambulatory Visit (INDEPENDENT_AMBULATORY_CARE_PROVIDER_SITE_OTHER): Payer: 59 | Admitting: *Deleted

## 2017-11-10 DIAGNOSIS — Z5181 Encounter for therapeutic drug level monitoring: Secondary | ICD-10-CM | POA: Diagnosis not present

## 2017-11-10 DIAGNOSIS — I240 Acute coronary thrombosis not resulting in myocardial infarction: Secondary | ICD-10-CM

## 2017-11-10 DIAGNOSIS — I513 Intracardiac thrombosis, not elsewhere classified: Secondary | ICD-10-CM

## 2017-11-10 LAB — POCT INR: INR: 2

## 2017-11-10 MED ORDER — WARFARIN SODIUM 10 MG PO TABS: ORAL_TABLET | ORAL | 1 refills | Status: DC

## 2017-11-10 NOTE — Patient Instructions (Signed)
Description   Today take 1 tablet continue taking 1/2 tablet daily except 1 tablet on Wednesdays. Recheck in 3 weeks. Call with any new medications or concerns, [phone]

## 2017-11-16 ENCOUNTER — Other Ambulatory Visit (HOSPITAL_COMMUNITY): Payer: Self-pay | Admitting: Pharmacist

## 2017-11-16 MED ORDER — SPIRONOLACTONE 25 MG PO TABS: 12.5000 mg | ORAL_TABLET | Freq: Every day | ORAL | 5 refills | Status: DC

## 2017-11-22 ENCOUNTER — Encounter (HOSPITAL_COMMUNITY): Payer: 59

## 2017-11-27 ENCOUNTER — Ambulatory Visit (HOSPITAL_COMMUNITY)
Admission: RE | Admit: 2017-11-27 | Discharge: 2017-11-27 | Disposition: A | Discharge status: Home / Self Care | Payer: 59 | Source: Ambulatory Visit | Attending: Internal Medicine | Admitting: Internal Medicine

## 2017-11-27 ENCOUNTER — Telehealth (HOSPITAL_COMMUNITY): Payer: Self-pay | Admitting: Cardiology

## 2017-11-27 ENCOUNTER — Encounter (HOSPITAL_COMMUNITY): Payer: Self-pay

## 2017-11-27 VITALS — BP 116/84 | HR 74 | Wt 230.6 lb

## 2017-11-27 DIAGNOSIS — I2510 Atherosclerotic heart disease of native coronary artery without angina pectoris: Secondary | ICD-10-CM | POA: Insufficient documentation

## 2017-11-27 DIAGNOSIS — Z79899 Other long term (current) drug therapy: Secondary | ICD-10-CM | POA: Diagnosis not present

## 2017-11-27 DIAGNOSIS — I110 Hypertensive heart disease with heart failure: Secondary | ICD-10-CM | POA: Insufficient documentation

## 2017-11-27 DIAGNOSIS — I5022 Chronic systolic (congestive) heart failure: Secondary | ICD-10-CM | POA: Diagnosis not present

## 2017-11-27 DIAGNOSIS — G510 Bell's palsy: Secondary | ICD-10-CM | POA: Insufficient documentation

## 2017-11-27 DIAGNOSIS — I240 Acute coronary thrombosis not resulting in myocardial infarction: Secondary | ICD-10-CM

## 2017-11-27 DIAGNOSIS — Z7982 Long term (current) use of aspirin: Secondary | ICD-10-CM | POA: Insufficient documentation

## 2017-11-27 DIAGNOSIS — I513 Intracardiac thrombosis, not elsewhere classified: Secondary | ICD-10-CM | POA: Diagnosis not present

## 2017-11-27 DIAGNOSIS — Z8249 Family history of ischemic heart disease and other diseases of the circulatory system: Secondary | ICD-10-CM | POA: Diagnosis not present

## 2017-11-27 DIAGNOSIS — Z7901 Long term (current) use of anticoagulants: Secondary | ICD-10-CM | POA: Diagnosis not present

## 2017-11-27 DIAGNOSIS — Z87891 Personal history of nicotine dependence: Secondary | ICD-10-CM | POA: Insufficient documentation

## 2017-11-27 LAB — BASIC METABOLIC PANEL
Anion gap: 6 (ref 5–15)
BUN: 15 mg/dL (ref 6–20)
CO2: 28 mmol/L (ref 22–32)
Calcium: 9 mg/dL (ref 8.9–10.3)
Chloride: 102 mmol/L (ref 101–111)
Creatinine, Ser: 0.84 mg/dL (ref 0.61–1.24)
GFR calc Af Amer: >60 mL/min (ref >60)
GFR calc non Af Amer: >60 mL/min (ref >60)
Glucose, Bld: 130 mg/dL — ABNORMAL HIGH (ref 65–99)
Potassium: 4.8 mmol/L (ref 3.5–5.1)
Sodium: 136 mmol/L (ref 135–145)

## 2017-11-27 MED ORDER — CARVEDILOL 6.25 MG PO TABS: 6.2500 mg | ORAL_TABLET | Freq: Two times a day (BID) | ORAL | 6 refills | Status: DC

## 2017-11-27 MED ORDER — FUROSEMIDE 40 MG PO TABS: 20.0000 mg | ORAL_TABLET | Freq: Every day | ORAL | 3 refills | Status: DC

## 2017-11-27 MED ORDER — SACUBITRIL-VALSARTAN 49-51 MG PO TABS: 1.0000 | ORAL_TABLET | Freq: Two times a day (BID) | ORAL | 6 refills | Status: DC

## 2017-11-27 MED ORDER — ASPIRIN 81 MG PO CHEW: 81.0000 mg | CHEWABLE_TABLET | Freq: Every day | ORAL | 6 refills | Status: DC

## 2017-11-27 MED ORDER — SPIRONOLACTONE 25 MG PO TABS: 12.5000 mg | ORAL_TABLET | Freq: Every day | ORAL | 6 refills | Status: DC

## 2017-11-27 MED ORDER — ATORVASTATIN CALCIUM 80 MG PO TABS: 80.0000 mg | ORAL_TABLET | Freq: Every day | ORAL | 6 refills | Status: DC

## 2017-11-27 MED ORDER — FUROSEMIDE 40 MG PO TABS: 20.0000 mg | ORAL_TABLET | Freq: Every day | ORAL | 6 refills | Status: DC

## 2017-11-27 NOTE — Progress Notes (Signed)
Advanced Heart Failure Clinic Note  PCP: Dr Nehemiah Settle  Primary Cardiologist:Dr Shirlee Latch   HPI: Shawn Webster is a 62 y.o. male with a h/o chronic systolic heart failure (08/2017), LV thrombus on coumadin, CAD 2 vessel disease, and former smoker.   Admitted the end of October with increased dyspnea and fatigue. ECHO showed severely reduced EF ~20%.  LHC showed severe coronary disease. Evaluated by Dr Swaziland and Dr Laneta Simmers with recommendations for medical management. Started on low dose HF meds.   He presents today for regular follow up. At last visit (pharmacy) coreg increased. Has been feeling fine since.  Denies lightheadedness or dizziness. Denies bleeding on coumadin.  Having trouble getting medication refills at times, states some were left over from inpatient prescriptions and don't have refills. Has been taking all medications as directed.  ECHO 08/2017 -EF 20-25%  Diffuse HK, RV mildly reduced.   CMRI 09/15/2017  1. Moderately dilated LV with mild LV hypertrophy. 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 2.  LV thrombus noted. 3.  Normal RV size with mildly decreased systolic function. 4. The delayed enhancement pattern suggests that the inferolateral wall would not be expected to improve with revascularization of the left circumflex. There did appear to be significant viability within the LAD territory with the exception of the apex.  RHC/LHC 09/15/2017  RA mean 8 RV 50/7 PA 54/23, mean 34 PCWP mean 22 LV 121/28 AO 123/87 Oxygen saturations: PA 69% AO 92% Cardiac Output (Fick) 6.01  Cardiac Index (Fick) 2.62 PVR 2 WU LCx has diffuse, severe disease. The proximal LAD is totally occluded with poor collateralization.   Review of systems complete and found to be negative unless listed in HPI.    SH:  Social History   Socioeconomic History  . Marital status: Married    Spouse name: Not on file  .  Number of children: Not on file  . Years of education: Not on file  . Highest education level: Not on file  Social Needs  . Financial resource strain: Not on file  . Food insecurity - worry: Not on file  . Food insecurity - inability: Not on file  . Transportation needs - medical: Not on file  . Transportation needs - non-medical: Not on file  Occupational History  . Not on file  Tobacco Use  . Smoking status: Former Smoker    Types: Cigars    Last attempt to quit: 08/21/2017    Years since quitting: 0.2  . Smokeless tobacco: Never Used  . Tobacco comment: 1 cigar per day  Substance and Sexual Activity  . Alcohol use: Yes    Comment: 2 martinis per day  . Drug use: No  . Sexual activity: Not on file  Other Topics Concern  . Not on file  Social History Narrative  . Not on file    FH:  Family History  Problem Relation Age of Onset  . Dementia Mother   . CAD Father     Past Medical History:  Diagnosis Date  . Bell's palsy   . CHF (congestive heart failure) (HCC)   . Hypertension     Current Outpatient Medications  Medication Sig Dispense Refill  . aspirin 81 MG chewable tablet Chew 1 tablet (81 mg total) by mouth daily. 30 tablet 1  . atorvastatin (LIPITOR) 80 MG tablet Take 1 tablet (80 mg total) by mouth daily at 6 PM. 30 tablet 2  .  carvedilol (COREG) 6.25 MG tablet Take 1 tablet (6.25 mg total) by mouth 2 (two) times daily with a meal. 60 tablet 5  . furosemide (LASIX) 40 MG tablet Take 40 mg by mouth daily.    . potassium chloride SA (K-DUR,KLOR-CON) 20 MEQ tablet Take 20 mEq by mouth daily.    . sacubitril-valsartan (ENTRESTO) 24-26 MG Take 1 tablet by mouth 2 (two) times daily. 60 tablet 1  . spironolactone (ALDACTONE) 25 MG tablet Take 0.5 tablets (12.5 mg total) by mouth daily. 15 tablet 5  . warfarin (COUMADIN) 10 MG tablet Take 1/2 tab daily except 1 tab on Wednesday or as Directed by [facility] 30 tablet 1   No current facility-administered  medications for this encounter.     Vitals:   11/27/17 1156  BP: 116/84  Pulse: 74  SpO2: 96%  Weight: 230 lb 9.6 oz (104.6 kg)   Wt Readings from Last 3 Encounters:  11/27/17 230 lb 9.6 oz (104.6 kg)  11/01/17 227 lb (103 kg)  10/29/17 236 lb (107 kg)   PHYSICAL EXAM: General: Well appearing. No resp difficulty. HEENT: Normal Neck: Supple. JVP 5-6. Carotids 2+ bilat; no bruits. No thyromegaly or nodule noted. Cor: PMI nondisplaced. RRR, No M/G/R noted Lungs: CTAB, normal effort. Abdomen: Soft, non-tender, non-distended, no HSM. No bruits or masses. +BS  Extremities: No cyanosis, clubbing, or rash. R and LLE no edema.  Neuro: Alert & orientedx3, cranial nerves grossly intact. moves all 4 extremities w/o difficulty. Affect pleasant   ASSESSMENT & PLAN: 1. Chronic Systolic Heart Failure  08/2017 ECHO 20-25%. ICM and ETOH may be playing a role. Plan to repeat ECHO in 3 months after HF meds optimized.  - NYHA II. - Volume status stable on exam.  - Decrease lasix to 20 mg daily. Can take extra 20 mg as needed.  - Continue coreg 6.25 mg BID.  - Increase entresto to 49/51 mg BID. BMET today and 2 weeks. - Continue 12.5 mg spiro.   - QRS narrow on 10/24 so no role for BIV 2. CAD:  - No s/s of ischemia.    - Continue statin, bb, aspirin, and coumadin.  - Have asked to continue exercise. Walking up to 1 mile a day weather permitting. Refuses CR at this time.  3. LV Thrombus - On coumadin.  - INR followed at the [facility] office. No change.  4. Smoking  Has not smoked since discharge. Encouraged to stay away from nicotine. No change.  5. ETOH  - Continues to drink wine. We discussed cessation.    Doing well overall. Continue to titrate meds as above. RTC 2 months with Echo. If EF remains depressed, will need ICD. Discussed at visit today.   Graciella Freer, PA-C  12:02 PM  Greater than 50% of the 30 minute visit was spent in counseling/coordination of care regarding  disease state education, salt/fluid restriction, sliding scale diuretics, and medication compliance.

## 2017-11-27 NOTE — Patient Instructions (Addendum)
INCREASE Entresto to 49/51 mg one tab twice a day DECREASE Lasix to 20 mg one half tab daily  Labs today We will only contact you if something comes back abnormal or we need to make some changes. Otherwise no news is good news!  Labs needed again in 2 weeks  Your physician recommends that you schedule a follow-up appointment in: 6 weeks with Dr Shirlee Latch and echo  Your physician has requested that you have an echocardiogram. Echocardiography is a painless test that uses sound waves to create images of your heart. It provides your doctor with information about the size and shape of your heart and how well your heart's chambers and valves are working. This procedure takes approximately one hour. There are no restrictions for this procedure.  Do the following things EVERYDAY: 1) Weigh yourself in the morning before breakfast. Write it down and keep it in a log. 2) Take your medicines as prescribed 3) Eat low salt foods-Limit salt (sodium) to 2000 mg per day.  4) Stay as active as you can everyday 5) Limit all fluids for the day to less than 2 liters

## 2017-11-27 NOTE — Telephone Encounter (Signed)
-----   Message from Graciella Freer, PA-C sent at 11/27/2017  1:43 PM EST ----- Stop daily potassium.   Repeat BMET 2 weeks as planned.  Avoid high potassium foods.    Casimiro Needle [address]" [place], PA-C 11/27/2017 1:43 PM

## 2017-12-01 ENCOUNTER — Ambulatory Visit (INDEPENDENT_AMBULATORY_CARE_PROVIDER_SITE_OTHER): Payer: 59 | Admitting: *Deleted

## 2017-12-01 DIAGNOSIS — I513 Intracardiac thrombosis, not elsewhere classified: Secondary | ICD-10-CM

## 2017-12-01 DIAGNOSIS — Z5181 Encounter for therapeutic drug level monitoring: Secondary | ICD-10-CM

## 2017-12-01 DIAGNOSIS — I240 Acute coronary thrombosis not resulting in myocardial infarction: Secondary | ICD-10-CM

## 2017-12-01 LAB — POCT INR: INR: 1.8

## 2017-12-01 NOTE — Patient Instructions (Signed)
Description   Today take 1 tablet then start taking 1/2 tablet daily except 1 tablet on Monday and Wednesdays. Continue eating 1 serving of dark green leafy veggies weekly.  Recheck in 2 weeks. Call with any new medications or concerns, [phone]

## 2017-12-11 ENCOUNTER — Ambulatory Visit (HOSPITAL_COMMUNITY)
Admission: RE | Admit: 2017-12-11 | Discharge: 2017-12-11 | Disposition: A | Discharge status: Home / Self Care | Payer: 59 | Source: Ambulatory Visit | Attending: Internal Medicine | Admitting: Internal Medicine

## 2017-12-11 DIAGNOSIS — I5022 Chronic systolic (congestive) heart failure: Secondary | ICD-10-CM | POA: Diagnosis not present

## 2017-12-11 LAB — BASIC METABOLIC PANEL
Anion gap: 8 (ref 5–15)
BUN: 16 mg/dL (ref 6–20)
CO2: 27 mmol/L (ref 22–32)
Calcium: 8.9 mg/dL (ref 8.9–10.3)
Chloride: 103 mmol/L (ref 101–111)
Creatinine, Ser: 0.88 mg/dL (ref 0.61–1.24)
GFR calc Af Amer: >60 mL/min (ref >60)
GFR calc non Af Amer: >60 mL/min (ref >60)
Glucose, Bld: 121 mg/dL — ABNORMAL HIGH (ref 65–99)
Potassium: 5.3 mmol/L — ABNORMAL HIGH (ref 3.5–5.1)
Sodium: 138 mmol/L (ref 135–145)

## 2017-12-12 ENCOUNTER — Telehealth (HOSPITAL_COMMUNITY): Payer: Self-pay | Admitting: Cardiology

## 2017-12-12 DIAGNOSIS — I5022 Chronic systolic (congestive) heart failure: Secondary | ICD-10-CM

## 2017-12-12 NOTE — Telephone Encounter (Signed)
-----   Message from Graciella Freer, PA-C sent at 12/12/2017 10:38 AM EST ----- Please see below.

## 2017-12-12 NOTE — Telephone Encounter (Signed)
Repeat labs 1/31

## 2017-12-13 ENCOUNTER — Telehealth (HOSPITAL_COMMUNITY): Payer: Self-pay

## 2017-12-13 NOTE — Telephone Encounter (Signed)
Called to speak with patient in regards to insurance- patient stated he will call insurance company. If plan doesn't change, I did explain Cardiac Rehab Maintenance program to patient. Will follow up.

## 2017-12-19 ENCOUNTER — Ambulatory Visit (INDEPENDENT_AMBULATORY_CARE_PROVIDER_SITE_OTHER): Payer: 59 | Admitting: *Deleted

## 2017-12-19 DIAGNOSIS — I240 Acute coronary thrombosis not resulting in myocardial infarction: Secondary | ICD-10-CM

## 2017-12-19 DIAGNOSIS — Z5181 Encounter for therapeutic drug level monitoring: Secondary | ICD-10-CM | POA: Diagnosis not present

## 2017-12-19 DIAGNOSIS — I513 Intracardiac thrombosis, not elsewhere classified: Secondary | ICD-10-CM | POA: Diagnosis not present

## 2017-12-19 LAB — POCT INR: INR: 2.3

## 2017-12-19 NOTE — Patient Instructions (Signed)
Description   Continue taking 1/2 tablet daily except 1 tablet on Monday and Wednesdays. Continue eating 1 serving of dark green leafy veggies weekly.  Recheck in 3 weeks. Call with any new medications or concerns, [phone]

## 2017-12-20 ENCOUNTER — Telehealth (HOSPITAL_COMMUNITY): Payer: Self-pay

## 2017-12-20 NOTE — Telephone Encounter (Signed)
Called to follow up with patient. He stated he never got in contact with his insurance company and also stated that this program doesn't seem like a program he would be interested in. Closed referral.

## 2017-12-21 ENCOUNTER — Ambulatory Visit (HOSPITAL_COMMUNITY)
Admission: RE | Admit: 2017-12-21 | Discharge: 2017-12-21 | Disposition: A | Discharge status: Home / Self Care | Payer: 59 | Source: Ambulatory Visit | Attending: Cardiology | Admitting: Cardiology

## 2017-12-21 DIAGNOSIS — I5022 Chronic systolic (congestive) heart failure: Secondary | ICD-10-CM | POA: Diagnosis present

## 2017-12-21 LAB — BASIC METABOLIC PANEL
Anion gap: 7 (ref 5–15)
BUN: 10 mg/dL (ref 6–20)
CO2: 26 mmol/L (ref 22–32)
Calcium: 8.9 mg/dL (ref 8.9–10.3)
Chloride: 106 mmol/L (ref 101–111)
Creatinine, Ser: 0.82 mg/dL (ref 0.61–1.24)
GFR calc Af Amer: >60 mL/min (ref >60)
GFR calc non Af Amer: >60 mL/min (ref >60)
Glucose, Bld: 119 mg/dL — ABNORMAL HIGH (ref 65–99)
Potassium: 4.8 mmol/L (ref 3.5–5.1)
Sodium: 139 mmol/L (ref 135–145)

## 2018-01-09 ENCOUNTER — Ambulatory Visit (INDEPENDENT_AMBULATORY_CARE_PROVIDER_SITE_OTHER): Payer: 59 | Admitting: *Deleted

## 2018-01-09 DIAGNOSIS — Z5181 Encounter for therapeutic drug level monitoring: Secondary | ICD-10-CM

## 2018-01-09 DIAGNOSIS — I513 Intracardiac thrombosis, not elsewhere classified: Secondary | ICD-10-CM

## 2018-01-09 DIAGNOSIS — I240 Acute coronary thrombosis not resulting in myocardial infarction: Secondary | ICD-10-CM

## 2018-01-09 LAB — POCT INR: INR: 2.1

## 2018-01-09 NOTE — Patient Instructions (Signed)
Description   Continue taking 1/2 tablet daily except 1 tablet on Monday and Wednesdays. Continue eating 1 serving of dark green leafy veggies weekly.  Recheck in 4 weeks. Call with any new medications or concerns, [phone]     

## 2018-01-11 ENCOUNTER — Ambulatory Visit (HOSPITAL_COMMUNITY)
Admission: RE | Admit: 2018-01-11 | Discharge: 2018-01-11 | Disposition: A | Discharge status: Home / Self Care | Payer: 59 | Source: Ambulatory Visit | Attending: Cardiology | Admitting: Cardiology

## 2018-01-11 ENCOUNTER — Ambulatory Visit (HOSPITAL_BASED_OUTPATIENT_CLINIC_OR_DEPARTMENT_OTHER)
Admission: RE | Admit: 2018-01-11 | Discharge: 2018-01-11 | Disposition: A | Discharge status: Home / Self Care | Payer: 59 | Source: Ambulatory Visit | Attending: Cardiology | Admitting: Cardiology

## 2018-01-11 VITALS — BP 128/95 | HR 73 | Wt 232.4 lb

## 2018-01-11 DIAGNOSIS — E785 Hyperlipidemia, unspecified: Secondary | ICD-10-CM | POA: Insufficient documentation

## 2018-01-11 DIAGNOSIS — I5022 Chronic systolic (congestive) heart failure: Secondary | ICD-10-CM | POA: Insufficient documentation

## 2018-01-11 DIAGNOSIS — I255 Ischemic cardiomyopathy: Secondary | ICD-10-CM | POA: Insufficient documentation

## 2018-01-11 DIAGNOSIS — I110 Hypertensive heart disease with heart failure: Secondary | ICD-10-CM | POA: Diagnosis not present

## 2018-01-11 DIAGNOSIS — Z8249 Family history of ischemic heart disease and other diseases of the circulatory system: Secondary | ICD-10-CM | POA: Insufficient documentation

## 2018-01-11 DIAGNOSIS — E875 Hyperkalemia: Secondary | ICD-10-CM | POA: Insufficient documentation

## 2018-01-11 DIAGNOSIS — F1010 Alcohol abuse, uncomplicated: Secondary | ICD-10-CM

## 2018-01-11 DIAGNOSIS — Z86718 Personal history of other venous thrombosis and embolism: Secondary | ICD-10-CM | POA: Diagnosis not present

## 2018-01-11 DIAGNOSIS — Z7901 Long term (current) use of anticoagulants: Secondary | ICD-10-CM | POA: Diagnosis not present

## 2018-01-11 DIAGNOSIS — I2510 Atherosclerotic heart disease of native coronary artery without angina pectoris: Secondary | ICD-10-CM | POA: Insufficient documentation

## 2018-01-11 DIAGNOSIS — I513 Intracardiac thrombosis, not elsewhere classified: Secondary | ICD-10-CM

## 2018-01-11 DIAGNOSIS — I2582 Chronic total occlusion of coronary artery: Secondary | ICD-10-CM | POA: Diagnosis not present

## 2018-01-11 DIAGNOSIS — Z87891 Personal history of nicotine dependence: Secondary | ICD-10-CM | POA: Insufficient documentation

## 2018-01-11 DIAGNOSIS — Z79899 Other long term (current) drug therapy: Secondary | ICD-10-CM | POA: Diagnosis not present

## 2018-01-11 DIAGNOSIS — I240 Acute coronary thrombosis not resulting in myocardial infarction: Secondary | ICD-10-CM

## 2018-01-11 LAB — LIPID PANEL
Cholesterol: 269 mg/dL — ABNORMAL HIGH (ref 0–200)
HDL: 48 mg/dL (ref >40)
LDL Cholesterol: 199 mg/dL — ABNORMAL HIGH (ref 0–99)
Total CHOL/HDL Ratio: 5.6 RATIO
Triglycerides: 109 mg/dL (ref <150)
VLDL: 22 mg/dL (ref 0–40)

## 2018-01-11 LAB — BASIC METABOLIC PANEL
Anion gap: 9 (ref 5–15)
BUN: 14 mg/dL (ref 6–20)
CO2: 26 mmol/L (ref 22–32)
Calcium: 9.2 mg/dL (ref 8.9–10.3)
Chloride: 102 mmol/L (ref 101–111)
Creatinine, Ser: 0.85 mg/dL (ref 0.61–1.24)
GFR calc Af Amer: >60 mL/min (ref >60)
GFR calc non Af Amer: >60 mL/min (ref >60)
Glucose, Bld: 135 mg/dL — ABNORMAL HIGH (ref 65–99)
Potassium: 4.8 mmol/L (ref 3.5–5.1)
Sodium: 137 mmol/L (ref 135–145)

## 2018-01-11 MED ORDER — PERFLUTREN LIPID MICROSPHERE
1.0000 mL | INTRAVENOUS | Status: AC | PRN
  Filled 2018-01-11: qty 10

## 2018-01-11 MED ORDER — CARVEDILOL 12.5 MG PO TABS: 12.5000 mg | ORAL_TABLET | Freq: Two times a day (BID) | ORAL | 3 refills | Status: DC

## 2018-01-11 NOTE — Progress Notes (Signed)
CSW referred to assist with overwhelming medical bills. Patient reports he is retired and his wife is self employed. Patient received a medical bill for over $20,000.00 after insurance paid which he paid a few weeks ago. CSW shared little to no options for assistance once medical bills have been paid and encouraged patient to contact insurance to follow up. Wife states patient has a disability/hospitalization plan and hopeful to submit application for some financial reimbursement. CSW offered assistance although wife states may have needed information at this time and will reach out to CSW if needed. CSW available if needed. Lasandra Beech, LCSW, CCSW-MCS [phone]

## 2018-01-11 NOTE — Progress Notes (Signed)
Advanced Heart Failure Clinic Note  PCP: Dr Nehemiah Settle  Primary Cardiologist: Dr Shirlee Latch   HPI: Shawn Webster is a 62 y.o. male with a h/o chronic systolic heart failure (08/2017), LV thrombus on coumadin, severe CAD, and former smoker.   Admitted in 10/18 with increased dyspnea and fatigue. ECHO showed severely reduced EF ~20%.  LHC showed severe coronary disease: occluded LAD without collaterals and diffusely diseased LCx.  The LCx territory did not appear viable. Evaluated by Dr Swazil[place] and Dr Laneta Simmers with recommendations for medical management, for the LAD there was no option for CTO revascularization or CABG. Started on low dose HF meds.   He presents today for followup of CHF.  He has been doing well.  Weight is stable. No chest pain.  He is mildly winded with inclines but does ok on flat ground.  No chest pain.  No palpitations.  No lightheadedness.  No orthopnea/PND.  He is not smoking. He is drinking about 2 drinks/day.   Labs (10/18): LDL 106 Labs (1/19): K 5.3 => 4.8, creatinine 0.82  PMH: 1. Chronic systolic CHF: Ischemic cardiomyopathy.  - Echo (10/18): EF 20-25%, diffuse hypokinesis, mildly decreased RV systolic function.  - Cardiac MRI (10/18): Moderately dilated LV with mild LVH, EF 19% with wall motion abnormalities, LV thrombus, LGE pattern suggesting inferolateral wall would not be expected to improve with revascularization of the left circumflex. There did appear to be significant viability within the LAD territory with the exception of the apex. - RHC (10/18): mean RA 8, PA 54/23 mean 34, mean PCWP 22, CI 2.62, PVR 2 WU.  - Echo (2/19): EF 30%, wall motion abnormalities, mild LV dilation, chronic-appearing LV thrombus.  2. LV apical thrombus.  3. CAD: LHC (10/18) with totally occluded LAD with minimal collateralization, diffuse severe disease in LCx. Films reviewed by Dr. Laneta Simmers and Dr [place], he was not thought to be candidate for CABG or CTO PCI.   4. Hyperlipidemia 5.  HTN 6. H/o Bell's palsy.  7. H/o ETOH abuse   Review of systems complete and found to be negative unless listed in HPI.     Social History   Socioeconomic History  . Marital status: Married    Spouse name: Not on file  . Number of children: Not on file  . Years of education: Not on file  . Highest education level: Not on file  Social Needs  . Financial resource strain: Not on file  . Food insecurity - worry: Not on file  . Food insecurity - inability: Not on file  . Transportation needs - medical: Not on file  . Transportation needs - non-medical: Not on file  Occupational History  . Not on file  Tobacco Use  . Smoking status: Former Smoker    Types: Cigars    Last attempt to quit: 08/21/2017    Years since quitting: 0.3  . Smokeless tobacco: Never Used  . Tobacco comment: 1 cigar per day  Substance and Sexual Activity  . Alcohol use: Yes    Comment: 2 martinis per day  . Drug use: No  . Sexual activity: Not on file  Other Topics Concern  . Not on file  Social History Narrative  . Not on file    Family History  Problem Relation Age of Onset  . Dementia Mother   . CAD Father     Past Medical History:  Diagnosis Date  . Bell's palsy   . CHF (congestive heart failure) (HCC)   .  Hypertension     Current Outpatient Medications  Medication Sig Dispense Refill  . atorvastatin (LIPITOR) 80 MG tablet Take 1 tablet (80 mg total) by mouth daily at 6 PM. 30 tablet 6  . carvedilol (COREG) 12.5 MG tablet Take 1 tablet (12.5 mg total) by mouth 2 (two) times daily with a meal. 60 tablet 3  . furosemide (LASIX) 40 MG tablet Take 0.5 tablets (20 mg total) by mouth daily. 15 tablet 6  . sacubitril-valsartan (ENTRESTO) 49-51 MG Take 1 tablet by mouth 2 (two) times daily. 60 tablet 6  . spironolactone (ALDACTONE) 25 MG tablet Take 0.5 tablets (12.5 mg total) by mouth daily. 15 tablet 6  . warfarin (COUMADIN) 10 MG tablet Take 1/2 tab daily except 1 tab on Wednesday or as  Directed by [facility] 30 tablet 1   No current facility-administered medications for this encounter.     Vitals:   01/11/18 1216  BP: (!) 128/95  Pulse: 73  SpO2: 100%  Weight: 232 lb 6.4 oz (105.4 kg)   Wt Readings from Last 3 Encounters:  01/11/18 232 lb 6.4 oz (105.4 kg)  11/27/17 230 lb 9.6 oz (104.6 kg)  11/01/17 227 lb (103 kg)   PHYSICAL EXAM: General: NAD Neck: No JVD, no thyromegaly or thyroid nodule.  Lungs: Clear to auscultation bilaterally with normal respiratory effort. CV: Nondisplaced PMI.  Heart regular S1/S2, no S3/S4, no murmur.  Trace ankle edema.  No carotid bruit.  Normal pedal pulses.  Abdomen: Soft, nontender, no hepatosplenomegaly, no distention.  Skin: Intact without lesions or rashes.  Neurologic: Alert and oriented x 3.  Psych: Normal affect. Extremities: No clubbing or cyanosis.  HEENT: Normal.   ASSESSMENT & PLAN: 1. Chronic Systolic Heart Failure: Echo 08/2017 ECHO 20-25%. Ischemic cardiomyopathy and ETOH may be both playing a role. Echo was done today and reviewed, EF remains around 30%.  NYHA class II symptoms.  No volume overload.  - He can continue Lasix 20 mg daily.    - Increase Coreg to 12.5 mg bid.  - Continue Entresto 49/51 mg BID.  - Continue spironolactone 12.5 mg daily.  Will not increase today given recent hyperkalemia.  - BMET today.   - QRS narrow on ECG so no role for CRT.  However, with persistently low EF, I will refer to EP for ICD.  Would prefer [employer] for [employer].  2. CAD: Severe 2 vessel disease on 10/18 cath. Diffuse, severely diseased LCx system and chronic total occlusion proximal LAD.  Cardiac MRI showed inferolateral aneurysm. Inferolateral wall does not appear viable so suspect intervention on LCx would not be helpful. There does appear to be significant viability in the anterior and anteroseptal walls, only the true apex appears non-viable by delayed enhancement imaging. I discussed with Dr. Swazil[place]  and Dr. Laneta Simmers during his 10/18 admission: not candidate for CTO of LAD or CABG.  - Continue atorvastatin, will need to check lipids today.  - Given use of warfarin, may stop ASA.  3. LV Thrombus: Continue warfarin.  4. Smoking: He has quit.  5. ETOH: He still drinks though not as heavily.  I recommended that he cut back further.     Followup in 1 month in pharmacy clinic for med titration then see me in 3 months.   Marca Ancona, MD  01/11/2018

## 2018-01-11 NOTE — Patient Instructions (Signed)
Stop Aspirin  Increase Carvedilol 12.5 mg (1 tab), twice a day  You have been referred to EP for ICD placement  Your physician recommends that you schedule a follow-up appointment in: 1 month with Fara Chute D  Your physician recommends that you schedule a follow-up appointment in: 3 months with Dr. Shirlee Latch

## 2018-01-11 NOTE — Progress Notes (Signed)
  Echocardiogram 2D Echocardiogram has been performed.  Shawn Webster 01/11/2018, 12:25 PM

## 2018-01-12 ENCOUNTER — Encounter (HOSPITAL_COMMUNITY): Payer: Self-pay

## 2018-01-12 ENCOUNTER — Telehealth (HOSPITAL_COMMUNITY): Payer: Self-pay

## 2018-01-12 ENCOUNTER — Other Ambulatory Visit (HOSPITAL_COMMUNITY): Payer: Self-pay

## 2018-01-12 DIAGNOSIS — I5022 Chronic systolic (congestive) heart failure: Secondary | ICD-10-CM

## 2018-01-12 DIAGNOSIS — E785 Hyperlipidemia, unspecified: Secondary | ICD-10-CM

## 2018-01-12 NOTE — Telephone Encounter (Signed)
Notes recorded by Teresa Coombs, RN on 01/12/2018 at 11:32 AM EST Pt was not taking Lipitor, pt did have medication at hand however. Pt agreed to start medication, Lab appointment made (orders placed), and Lipid Letter mailed to patient  ------  Notes recorded by Laurey Morale, MD on 01/12/2018 at 9:49 AM EST Need to call today. LDL 199, I strongly suspect he is not taking Lipitor 80 mg daily. He will have another heart attack with LDL this high. It is very important for him to restart atorvastatin 80 mg daily with lipids/LFTs in 2 months. Would confirm with him that he is taking his other cardiac meds as well, this makes me suspect that there may be other meds he is not taking. Please review his meds with him.

## 2018-01-13 IMAGING — CT CT HEAD W/O CM
4 series · 16 of 47 positions shown, 18 images · non-contrast
Comparison: None.

CLINICAL DATA: Unwitnessed fall.

EXAM:
CT HEAD WITHOUT CONTRAST
TECHNIQUE: Contiguous axial images were obtained from the base of the skull
through the vertex without intravenous contrast.

[Series 2: head wo · axial · 0.47mm/px · z∈[+159,+279]mm · 7 of 33 slices shown, 9 images]
[im 5/33  brain]
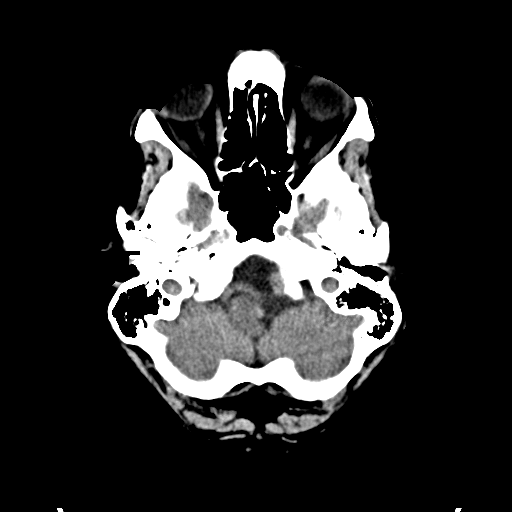
[im 5/33  bone]
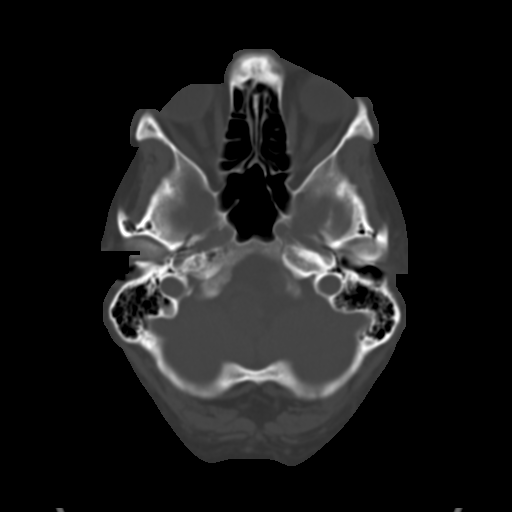
[im 9/33  brain]
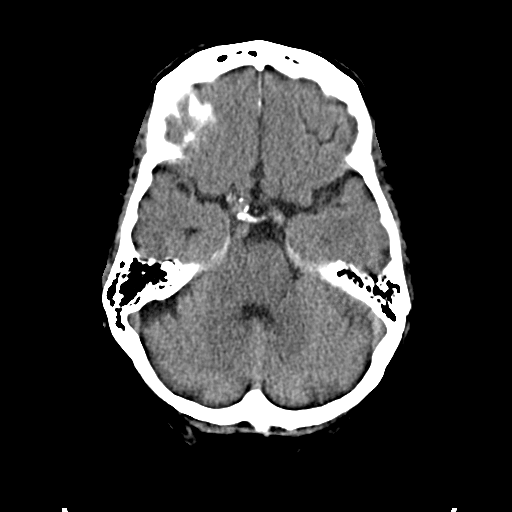
[im 13/33  brain]
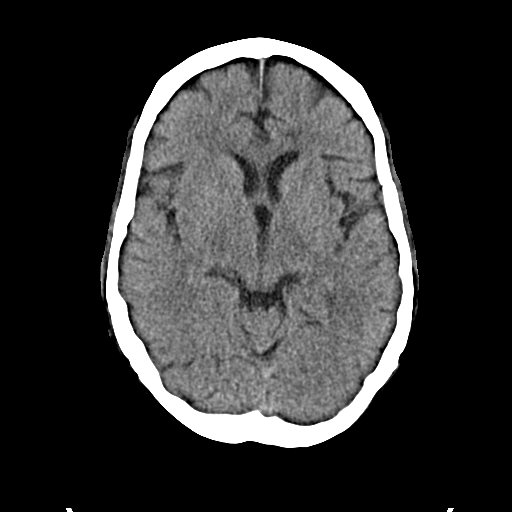
[im 17/33  brain]
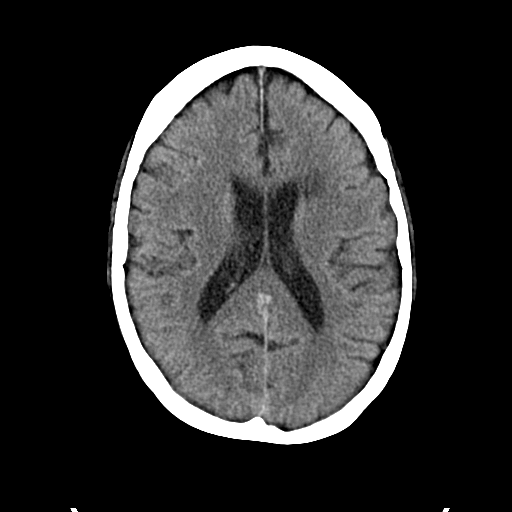
[im 21/33  brain]
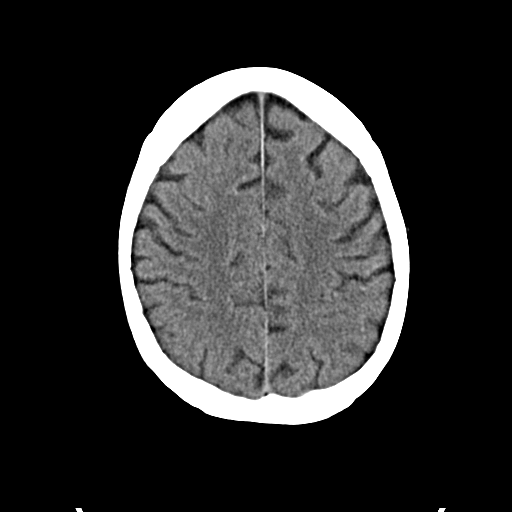
[im 21/33  bone]
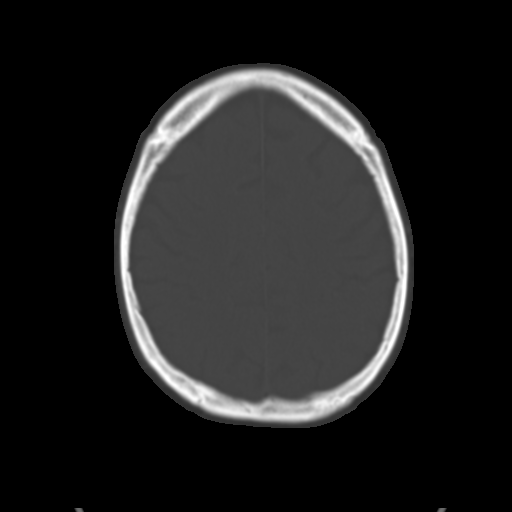
[im 25/33  brain]
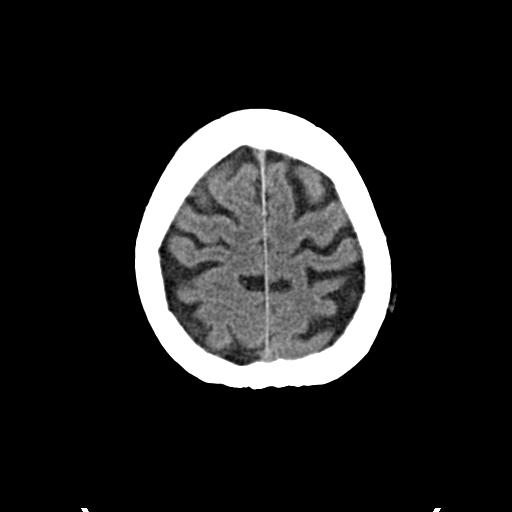
[im 29/33  brain]
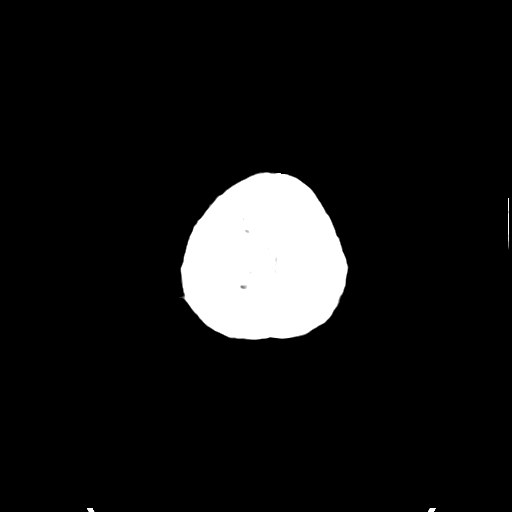

[Series 3: head bone · axial · 0.47mm/px · z∈[+155,+187]mm · 3 of 82 slices shown]
[im 9/82  bone]
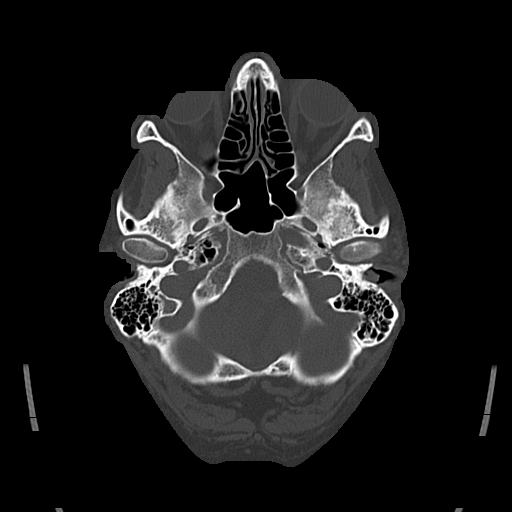
[im 17/82  bone]
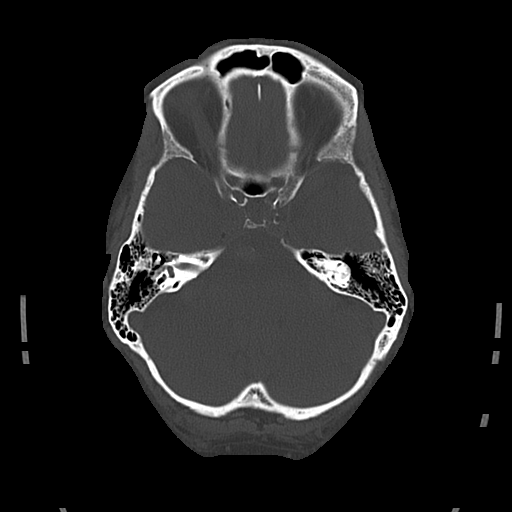
[im 25/82  bone]
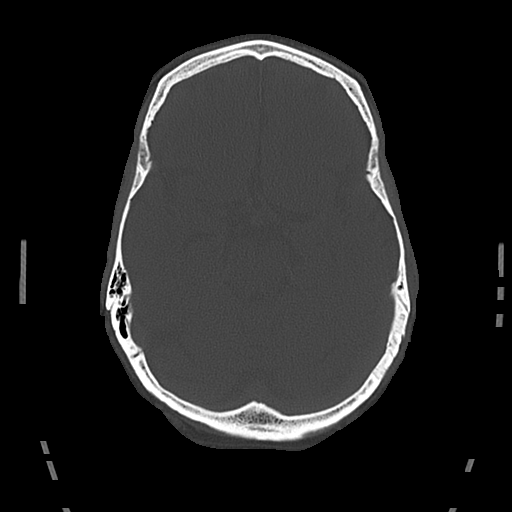

[Series 4: cor soft · coronal · 0.33mm/px · 3 of 75 slices shown]
[im 25/75  brain]
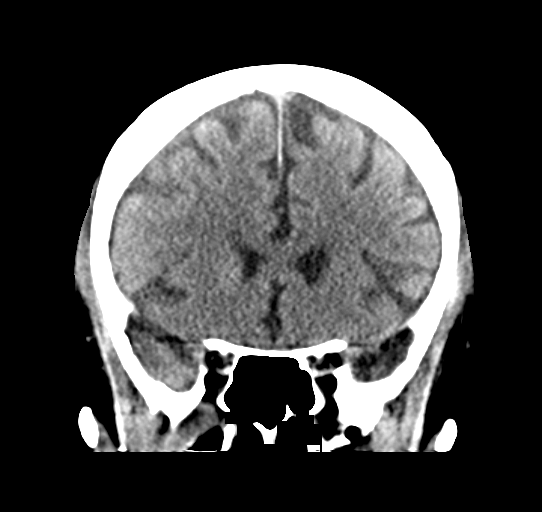
[im 33/75  brain]
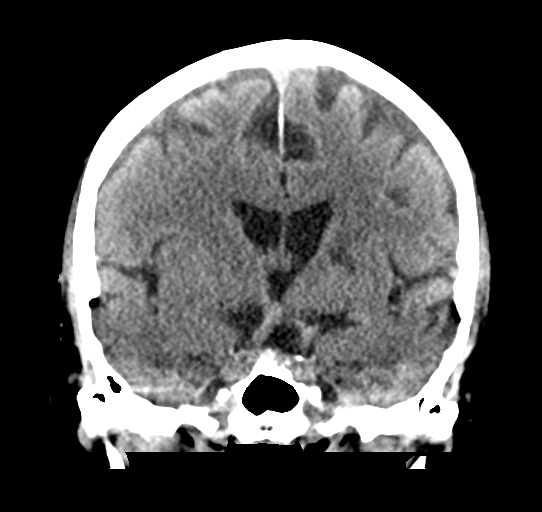
[im 42/75  brain]
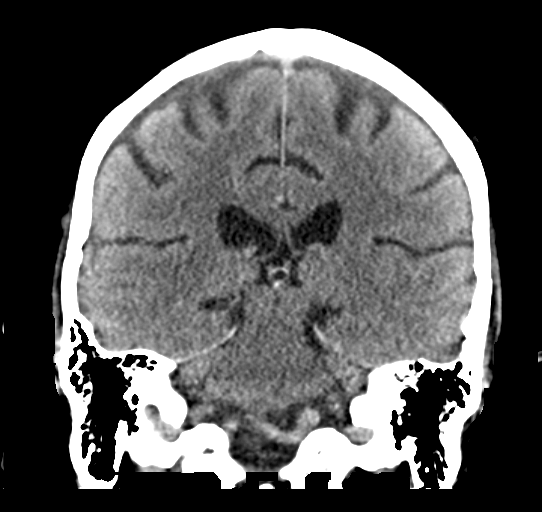

[Series 5: sag soft · sagittal · 0.35mm/px · 3 of 67 slices shown]
[im 23/67  brain]
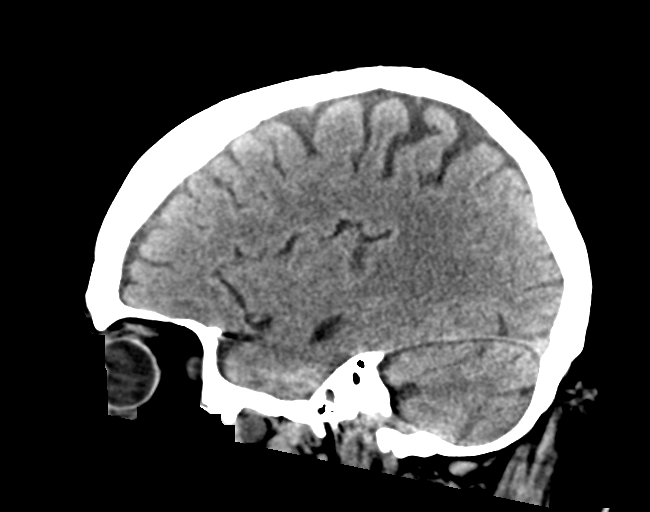
[im 34/67  brain]
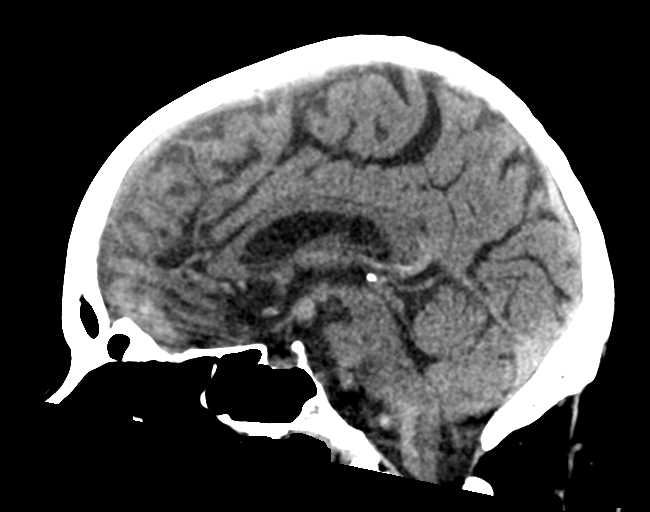
[im 45/67  brain]
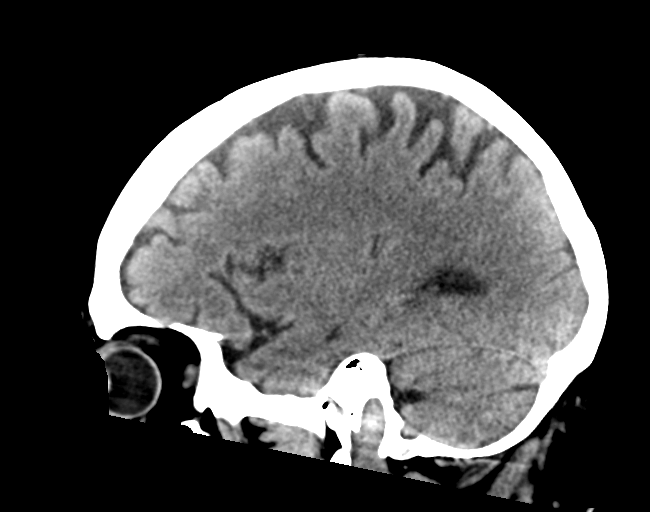

[16 of 47 positions shown; findings below may reference images not displayed]

FINDINGS: Brain: No subdural, epidural, or subarachnoid hemorrhage.
Cerebellum, brainstem, and basal cisterns are normal ventricles and
sulci are unremarkable. A lacunar infarct is seen in the left basal
ganglia on series 2, image 15 which is remote in appearance. A
lacunar infarct in the anterior limb of the right internal capsule
is age indeterminate. No acute cortical ischemia or infarct. No mass
effect or midline shift.

Vascular: Calcified atherosclerosis in the intracranial carotids.

Skull: Normal. Negative for fracture or focal lesion.

Sinuses/Orbits: No acute finding.

Other: None.
IMPRESSION: 1. There is an age-indeterminate lacunar infarct in the anterior
limb of the right internal capsule. No other acute abnormalities
identified.

## 2018-01-19 ENCOUNTER — Encounter: Payer: Self-pay | Admitting: Internal Medicine

## 2018-01-19 ENCOUNTER — Ambulatory Visit (INDEPENDENT_AMBULATORY_CARE_PROVIDER_SITE_OTHER): Payer: 59 | Admitting: Internal Medicine

## 2018-01-19 VITALS — BP 102/82 | HR 70 | Ht 72.0 in | Wt 232.0 lb

## 2018-01-19 DIAGNOSIS — I5022 Chronic systolic (congestive) heart failure: Secondary | ICD-10-CM | POA: Diagnosis not present

## 2018-01-19 NOTE — Patient Instructions (Addendum)
Medication Instructions:  Your physician recommends that you continue on your current medications as directed. Please refer to the Current Medication list given to you today.  Labwork: None ordered.  Testing/Procedures: Your physician has recommended that you have a defibrillator inserted. An implantable cardioverter defibrillator (ICD) is a small device that is placed in your chest or, in rare cases, your abdomen. This device uses electrical pulses or shocks to help control life-threatening, irregular heartbeats that could lead the heart to suddenly stop beating (sudden cardiac arrest). Leads are attached to the ICD that goes into your heart. This is done in the hospital and usually requires an overnight stay. Please see the instruction sheet given to you today for more information.  Follow-Up:  The following days are available for procedures: March , 8, 18, 20, 22, 25 and 28 April 1, 4, 8, 11, 15, 18, 22, 23, 24, and 30 If you decide on a day please give me a call:  Dierdre Highman [phone]   Any Other Special Instructions Will Be Listed Below (If Applicable).  If you need a refill on your cardiac medications before your next appointment, please call your pharmacy.   Cardioverter Defibrillator Implantation An implantable cardioverter defibrillator (ICD) is a small device that is placed under the skin in the chest or abdomen. An ICD consists of a battery, a small computer (pulse generator), and wires (leads) that go into the heart. An ICD is used to detect and correct two types of dangerous irregular heartbeats (arrhythmias):  A rapid heart rhythm (tachycardia).  An arrhythmia in which the lower chambers of the heart (ventricles) contract in an uncoordinated way (fibrillation).  When an ICD detects tachycardia, it sends a low-energy shock to the heart to restore the heartbeat to normal (cardioversion). This signal is usually painless. If cardioversion does not work or if the ICD detects  fibrillation, it delivers a high-energy shock to the heart (defibrillation) to restart the heart. This shock may feel like a strong jolt in the chest. Your health care provider may prescribe an ICD if:  You have had an arrhythmia that originated in the ventricles.  Your heart has been damaged by a disease or heart condition.  Sometimes, ICDs are programmed to act as a device called a pacemaker. Pacemakers can be used to treat a slow heartbeat (bradycardia) or tachycardia by taking over the heart rate with electrical impulses. Tell a health care provider about:  Any allergies you have.  All medicines you are taking, including vitamins, herbs, eye drops, creams, and over-the-counter medicines.  Any problems you or family members have had with anesthetic medicines.  Any blood disorders you have.  Any surgeries you have had.  Any medical conditions you have.  Whether you are pregnant or may be pregnant. What are the risks? Generally, this is a safe procedure. However, problems may occur, including:  Swelling, bleeding, or bruising.  Infection.  Blood clots.  Damage to other structures or organs, such as nerves, blood vessels, or the heart.  Allergic reactions to medicines used during the procedure.  What happens before the procedure? Staying hydrated Follow instructions from your health care provider about hydration, which may include:  Up to 2 hours before the procedure - you may continue to drink clear liquids, such as water, clear fruit juice, black coffee, and plain tea.  Eating and drinking restrictions Follow instructions from your health care provider about eating and drinking, which may include:  8 hours before the procedure - stop eating heavy  meals or foods such as meat, fried foods, or fatty foods.  6 hours before the procedure - stop eating light meals or foods, such as toast or cereal.  6 hours before the procedure - stop drinking milk or drinks that contain  milk.  2 hours before the procedure - stop drinking clear liquids.  Medicine Ask your health care provider about:  Changing or stopping your normal medicines. This is important if you take diabetes medicines or blood thinners.  Taking medicines such as aspirin and ibuprofen. These medicines can thin your blood. Do not take these medicines before your procedure if your doctor tells you not to.  Tests  You may have blood tests.  You may have a test to check the electrical signals in your heart (electrocardiogram, ECG).  You may have imaging tests, such as a chest X-ray. General instructions  For 24 hours before the procedure, stop using products that contain nicotine or tobacco, such as cigarettes and e-cigarettes. If you need help quitting, ask your health care provider.  Plan to have someone take you home from the hospital or clinic.  You may be asked to shower with a germ-killing soap. What happens during the procedure?  To reduce your risk of infection: ? Your health care team will wash or sanitize their hands. ? Your skin will be washed with soap. ? Hair may be removed from the surgical area.  Small monitors will be put on your body. They will be used to check your heart, blood pressure, and oxygen level.  An IV tube will be inserted into one of your veins.  You will be given one or more of the following: ? A medicine to help you relax (sedative). ? A medicine to numb the area (local anesthetic). ? A medicine to make you fall asleep (general anesthetic).  Leads will be guided through a blood vessel into your heart and attached to your heart muscles. Depending on the ICD, the leads may go into one ventricle or they may go into both ventricles and into an upper chamber of the heart. An X-ray machine (fluoroscope) will be usedto help guide the leads.  A small incision will be made to create a deep pocket under your skin.  The pulse generator will be placed into the  pocket.  The ICD will be tested.  The incision will be closed with stitches (sutures), skin glue, or staples.  A bandage (dressing) will be placed over the incision. This procedure may vary among health care providers and hospitals. What happens after the procedure?  Your blood pressure, heart rate, breathing rate, and blood oxygen level will be monitored often until the medicines you were given have worn off.  A chest X-ray will be taken to check that the ICD is in the right place.  You will need to stay in the hospital for 1-2 days so your health care provider can make sure your ICD is working.  Do not drive for 24 hours if you received a sedative. Ask your health care provider when it is safe for you to drive.  You may be given an identification card explaining that you have an ICD. Summary  An implantable cardioverter defibrillator (ICD) is a small device that is placed under the skin in the chest or abdomen. It is used to detect and correct dangerous irregular heartbeats (arrhythmias).  An ICD consists of a battery, a small computer (pulse generator), and wires (leads) that go into the heart.  When an  ICD detects rapid heart rhythm (tachycardia), it sends a low-energy shock to the heart to restore the heartbeat to normal (cardioversion). If cardioversion does not work or if the ICD detects uncoordinated heart contractions (fibrillation), it delivers a high-energy shock to the heart (defibrillation) to restart the heart.  You will need to stay in the hospital for 1-2 days to make sure your ICD is working. This information is not intended to replace advice given to you by your health care provider. Make sure you discuss any questions you have with your health care provider. Document Released: 07/30/2002 Document Revised: 11/16/2016 Document Reviewed: 11/16/2016 Elsevier Interactive Patient Education  2017 [employer].

## 2018-01-19 NOTE — Progress Notes (Signed)
HPI Shawn Webster is referred today for evaluation and consideration for ICD insertion. 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 He has severe LV dysfunction with an EF of 20% and an inferolateral wall aneurysm. He has severe 2 vessel CAD, but was not thought to be a candidate for revascularization. He has not had syncope. He has class 2 CHF symptoms. His QRS is narrow No Known Allergies   Current Outpatient Medications  Medication Sig Dispense Refill  . atorvastatin (LIPITOR) 80 MG tablet Take 1 tablet (80 mg total) by mouth daily at 6 PM. 30 tablet 6  . carvedilol (COREG) 12.5 MG tablet Take 1 tablet (12.5 mg total) by mouth 2 (two) times daily with a meal. 60 tablet 3  . furosemide (LASIX) 40 MG tablet Take 0.5 tablets (20 mg total) by mouth daily. 15 tablet 6  . sacubitril-valsartan (ENTRESTO) 49-51 MG Take 1 tablet by mouth 2 (two) times daily. 60 tablet 6  . spironolactone (ALDACTONE) 25 MG tablet Take 0.5 tablets (12.5 mg total) by mouth daily. 15 tablet 6  . warfarin (COUMADIN) 10 MG tablet Take 1/2 tab daily except 1 tab on Wednesday or as Directed by [facility] 30 tablet 1   No current facility-administered medications for this visit.      Past Medical History:  Diagnosis Date  . Bell's palsy   . CHF (congestive heart failure) (HCC)   . Hypertension     ROS:   All systems reviewed and negative except as noted in the HPI.   Past Surgical History:  Procedure Laterality Date  . HERNIA REPAIR    . RIGHT/LEFT HEART CATH AND CORONARY ANGIOGRAPHY N/A 09/15/2017   Procedure: RIGHT/LEFT HEART CATH AND CORONARY ANGIOGRAPHY;  Surgeon: Laurey Morale, MD;  Location[facility] INVASIVE CV LAB;  Service: Cardiovascular;  Laterality: N/A;  . ULTRASOUND GUIDANCE FOR VASCULAR ACCESS  09/15/2017   Procedure: Ultrasound Guidance For Vascular Access;  Surgeon: Laurey Morale, MD;  Location: [facility] INVASIVE CV LAB;  Service:  Cardiovascular;;     Family History  Problem Relation Age of Onset  . Dementia Mother   . CAD Father      Social History   Socioeconomic History  . Marital status: Married    Spouse name: Not on file  . Number of children: Not on file  . Years of education: Not on file  . Highest education level: Not on file  Social Needs  . Financial resource strain: Not on file  . Food insecurity - worry: Not on file  . Food insecurity - inability: Not on file  . Transportation needs - medical: Not on file  . Transportation needs - non-medical: Not on file  Occupational History  . Not on file  Tobacco Use  . Smoking status: Former Smoker    Types: Cigars    Last attempt to quit: 08/21/2017    Years since quitting: 0.4  . Smokeless tobacco: Never Used  . Tobacco comment: 1 cigar per day  Substance and Sexual Activity  . Alcohol use: Yes    Comment: 2 martinis per day  . Drug use: No  . Sexual activity: Not on file  Other Topics Concern  . Not on file  Social History Narrative  . Not on file     BP 102/82 (BP Location: Right Arm, Patient Position: Sitting, Cuff Size: Normal)   Pulse 70   Ht  6' (1.829 m)   Wt 232 lb (105.2 kg)   SpO2 99%   BMI 31.46 kg/m   Physical Exam:  Well appearing 62 yo man, NAD HEENT: Unremarkable Neck:  6 cm JVD, no thyromegally Lymphatics:  No adenopathy Back:  No CVA tenderness Lungs:  Clear with no wheezes HEART:  Regular rate rhythm, no murmurs, no rubs, no clicks Abd:  soft, positive bowel sounds, no organomegally, no rebound, no guarding Ext:  2 plus pulses, no edema, no cyanosis, no clubbing Skin:  No rashes no nodules Neuro:  CN II through XII intact, motor grossly intact  EKG - reviewed.   Assess/Plan: 1. Chronic systolic heart failure - his symptoms are class 2. He is on maximal medical therapy. I have discussed the treatment options with the patient. The risks/benefits/goals/expectations of the procedure with the patient and he  will call us if he wishes to proceed.  2. ICM - he denies anginal symptoms. He will continue his medical therapy.   Leonia Reeves.D.

## 2018-01-23 ENCOUNTER — Telehealth: Payer: Self-pay | Admitting: Internal Medicine

## 2018-01-23 NOTE — Telephone Encounter (Signed)
New Message:   Pt calling to speak to [place] per paper telling him to follow up with Dr. Lubertha Basque nurse.

## 2018-01-23 NOTE — Telephone Encounter (Signed)
Call returned to Pt.  Pt wanting to know if procedure is inpatient vs. Outpatient.  Notified Pt procedure was considered outpatient.  Gave Pt CPT code to give to his insurance.  No further needs at this time.

## 2018-02-06 ENCOUNTER — Ambulatory Visit (INDEPENDENT_AMBULATORY_CARE_PROVIDER_SITE_OTHER): Payer: 59 | Admitting: *Deleted

## 2018-02-06 DIAGNOSIS — I513 Intracardiac thrombosis, not elsewhere classified: Secondary | ICD-10-CM | POA: Diagnosis not present

## 2018-02-06 DIAGNOSIS — Z5181 Encounter for therapeutic drug level monitoring: Secondary | ICD-10-CM

## 2018-02-06 DIAGNOSIS — I240 Acute coronary thrombosis not resulting in myocardial infarction: Secondary | ICD-10-CM

## 2018-02-06 LAB — POCT INR: INR: 1.8

## 2018-02-06 NOTE — Patient Instructions (Addendum)
  Description   Today take 1 tablet, then Continue taking 1/2 tablet daily except 1 tablet on Monday and Wednesdays. Continue eating 1 serving of dark green leafy veggies weekly.  Recheck in 3 weeks. Call with any new medications or concerns, [phone]

## 2018-02-07 ENCOUNTER — Ambulatory Visit (HOSPITAL_COMMUNITY)
Admission: RE | Admit: 2018-02-07 | Discharge: 2018-02-07 | Disposition: A | Discharge status: Home / Self Care | Payer: 59 | Source: Ambulatory Visit | Attending: Cardiology | Admitting: Cardiology

## 2018-02-07 DIAGNOSIS — I5022 Chronic systolic (congestive) heart failure: Secondary | ICD-10-CM | POA: Insufficient documentation

## 2018-02-07 DIAGNOSIS — Z87891 Personal history of nicotine dependence: Secondary | ICD-10-CM | POA: Insufficient documentation

## 2018-02-07 DIAGNOSIS — I513 Intracardiac thrombosis, not elsewhere classified: Secondary | ICD-10-CM | POA: Insufficient documentation

## 2018-02-07 DIAGNOSIS — I2510 Atherosclerotic heart disease of native coronary artery without angina pectoris: Secondary | ICD-10-CM | POA: Insufficient documentation

## 2018-02-07 DIAGNOSIS — I255 Ischemic cardiomyopathy: Secondary | ICD-10-CM | POA: Diagnosis not present

## 2018-02-07 MED ORDER — SPIRONOLACTONE 25 MG PO TABS: 25.0000 mg | ORAL_TABLET | Freq: Every day | ORAL | 6 refills | Status: DC

## 2018-02-07 NOTE — Progress Notes (Signed)
HF MD: [facility]   HPI:  Shawn Huneke Godfreyis a 62 yo Caucasianmalewith a h/o chronic systolic heart failure (08/2017), LV thrombus on coumadin, severe CAD, and former smoker.   Admittedin 10/18with increased dyspnea and fatigue. ECHO showed severely reduced EF ~20%.LHC showed severe coronary disease. Evaluated by Dr Swaziland and Dr Laneta Simmers with recommendations for medical management, for the LAD there was no option for CTO revascularization or CABG. Started on low dose HF meds.  On 01/11/18, ECHO revealed EF 30%,Carvedilol was increased to 12.5mg  BID and aspirin was stopped. At visit on 01/19/18, Dr. Ladona Ridgel recommended patient for ICD implantation, patient stated that he will have to upgrade his insurance coverage prior to the procedure, but plans to schedule in April.  Today he returns to pharmacy clinic for further medication titration. He is not smoking. He is drinking about 2 drinks/day. Walking about a mile 3-4 days a week. Patient reports that he has been taking his Lipitor and other heart failure medications. Tolerated the increase in carvedilol at his last visit well.   Shortness of breath/dyspnea on exertion? no   Orthopnea/PND? no  Edema? no  Lightheadedness/dizziness? Yes, when he bends over and stands up too fast  Daily weights at home? Yes, stable at 227 lbs  Blood pressure/heart rate monitoring at home? No, has a cuff at home but does not check BP  Following low-sodium/fluid-restricted diet? yes  HF Medications: Carvedilol 12.5mg  BID Furosemide 20mg  QD Entresto 49-51mg  BID Spironolactone 12.5mg  QD  Has the patient been experiencing any side effects to the medications prescribed?  no  Does the patient have any problems obtaining medications due to transportation or finances?   No, [employer] commercial  Understanding of regimen: fair Understanding of indications: fair Potential of compliance: good Patient understands to avoid NSAIDs. Patient understands to avoid  decongestants.   Pertinent Lab Values:  01/11/18: Serum creatinine 0.85, Potassium 4.8, TC 269, TG 109, HDL 48, LDL 199.  Vital Signs:  Weight: 231.6 lb (dry weight: 230 lb)  Blood pressure: 132/92 mmHg  Heart rate: 76 bpm  Assessment: 1. Chronic systolic CHF EF 30%, due to Ischemic cardiomyopathy. NYHA class II symptoms. - Volume status stable  - Increase spironolactone to goal at 25 mg daily - BMET in 1 week given recent history of elevated potassium, consider increasing carvedilol at that time as long as BP and HR permit -Continue carvedilol 12.5 mg BID, Entresto49/51 mg BID, and furosemide 20 mg daily - Basic disease state pathophysiology, medication indication, mechanism and side effects reviewed at length with patient and he verbalized understanding  2. CAD - No S/S ischemia - Continue atorvastatin, carvedilol, and warfarin - Last lipid panel 01/11/18 TC 269, TG 109, HDL 48, LDL 199 - Patient states that someone had called and told him to stop taking Lipitor, but he has restarted since last lipid panel   3. LV Thrombus  - Continue warfarin - INR followed at the [facility] office  4. Smoking - He has quit smoking  5. ETOH - Continues to drink wine ~2 glasses/night  - Continue to encourage cessation  Plan: 1) Medication changes: Based on clinical presentation, vital signs and recent labs will increase spironolactone to 25mg  daily. 2) Labs: BMET in 1 week 3) Follow-up: Pharmacy clinic on 3/27, Dr. Shirlee Latch 04/10/18   [name], PharmD, BCPS, CPP Clinical Pharmacist Pager: [phone] Phone: [phone] 02/06/2018 11:59 AM

## 2018-02-07 NOTE — Patient Instructions (Signed)
It was great to see you today!  Please INCREASE your spironolactone to 1 tablet (25 mg) DAILY.   You are scheduled to see the pharmacist, Cicero Duck, again on 3/27 at 12:45 PM.

## 2018-02-10 ENCOUNTER — Other Ambulatory Visit: Payer: Self-pay | Admitting: Cardiology

## 2018-02-14 ENCOUNTER — Ambulatory Visit (HOSPITAL_COMMUNITY)
Admission: RE | Admit: 2018-02-14 | Discharge: 2018-02-14 | Disposition: A | Discharge status: Home / Self Care | Payer: 59 | Source: Ambulatory Visit | Attending: Cardiology | Admitting: Cardiology

## 2018-02-14 VITALS — BP 110/84 | HR 67 | Wt 232.4 lb

## 2018-02-14 DIAGNOSIS — I2510 Atherosclerotic heart disease of native coronary artery without angina pectoris: Secondary | ICD-10-CM | POA: Insufficient documentation

## 2018-02-14 DIAGNOSIS — I255 Ischemic cardiomyopathy: Secondary | ICD-10-CM | POA: Diagnosis not present

## 2018-02-14 DIAGNOSIS — I82890 Acute embolism and thrombosis of other specified veins: Secondary | ICD-10-CM | POA: Diagnosis not present

## 2018-02-14 DIAGNOSIS — I5022 Chronic systolic (congestive) heart failure: Secondary | ICD-10-CM | POA: Diagnosis not present

## 2018-02-14 LAB — BASIC METABOLIC PANEL
Anion gap: 8 (ref 5–15)
BUN: 14 mg/dL (ref 6–20)
CO2: 24 mmol/L (ref 22–32)
Calcium: 9 mg/dL (ref 8.9–10.3)
Chloride: 104 mmol/L (ref 101–111)
Creatinine, Ser: 0.83 mg/dL (ref 0.61–1.24)
GFR calc Af Amer: >60 mL/min (ref >60)
GFR calc non Af Amer: >60 mL/min (ref >60)
Glucose, Bld: 127 mg/dL — ABNORMAL HIGH (ref 65–99)
Potassium: 4.7 mmol/L (ref 3.5–5.1)
Sodium: 136 mmol/L (ref 135–145)

## 2018-02-14 MED ORDER — CARVEDILOL 12.5 MG PO TABS: 18.7500 mg | ORAL_TABLET | Freq: Two times a day (BID) | ORAL | 5 refills | Status: DC

## 2018-02-14 NOTE — Patient Instructions (Signed)
It was great to see you again today!  INCREASE carvedilol to 1 and 1/2 tablets (18.75mg ) twice daily   We will call with any changes based on the results of your blood work today if needed.  Follow up with the pharmacist, Cicero Duck, in 2 weeks.

## 2018-02-15 NOTE — Progress Notes (Signed)
HF MD: [facility]  HPI: Shawn Herford Godfreyis a 62 yo Caucasianmalewith a h/o chronic systolic heart failure (08/2017), LV thrombus on coumadin, severe CAD, and former smoker.   Admittedin 10/18with increased dyspnea and fatigue. ECHO showed severely reduced EF ~20%.LHC showed severe coronary disease.Evaluated by Dr Swaziland and Dr Laneta Simmers with recommendations for medical management, for the LAD there was no option for CTO revascularization or CABG. Started on low dose HF meds.  On 01/11/18, ECHO revealed EF 30%,Carvedilolwasincreased to 12.5mg  BID and aspirin was stopped.At visit on 01/19/18, Dr. Ladona Ridgel recommended patient for ICD implantation, patient stated that he will have to upgrade his insurance coverage prior to the procedure, but plans to schedule in April. He was not smoking. He was drinking about 2 drinks/day. Walkingabout a mile 3-4 days a week.Tolerated the increase in carvedilol well.  Today he returnsto pharmacy clinic for furthermedication titration and follow-up labs since spironolactone was increased to 25mg  at last visit. Patient tolerated this increase well. Patient took his morning medications. Has been walking more and had to walk 0.5 mi to and from jury duty to parking deck yesterday.    Shortness of breath/dyspnea on exertion?no  Orthopnea/PND?no  Edema?no  Lightheadedness/dizziness?Yes, when he bends over and stands up too fast  Daily weights at home?Yes, stableat227 lbs  Blood pressure/heart rate monitoring at home?No, has a cuff at homebut does not check BP  Following low-sodium/fluid-restricteddiet? yes  HF Medications: Carvedilol 12.5mg  BID Furosemide 20mg  QD Entresto 49-51mg  BID Spironolactone 25mg  QD  Has the patient been experiencing any side effects to the medications prescribed?no  Does the patient have any problems obtaining medications due to transportation or finances?No, [employer] commercial  Understanding of  regimen:fair Understanding of indications:fair Potential of compliance:good Patient understands to avoid NSAIDs. Patient understands to avoid decongestants.   Pertinent Lab Values:  02/14/18: Serum creatinine 0.83, Potassium 4.7, BUN 14, Sodium 136   01/11/18: Serum creatinine 0.85, Potassium 4.8,TC 269, TG 109, HDL 48, LDL 199.  Vital Signs:  Weight: 232.4lb(dry weight:230 lb)  Blood pressure:110/80mmHg  Heart rate:67bpm  3/20: BP 132/92, HR 76  Assessment: 1. Chronic systolic CHFEF 30%, due toIschemic cardiomyopathy. NYHA class II symptoms. -Volumestatus stable - Increase carvedilol to 18.75 mg BID - BMET today given recent history of elevated potassium -ContinueEntresto49/51 mg BID,spironolactone 25mg  daily, andfurosemide20 mg daily - Consider increasing Entresto at next visit if BP and K+ permit - Basic disease state pathophysiology, medication indication, mechanism and side effects reviewed at length with patient and he verbalized understanding  2. CAD - No S/S ischemia - Continue atorvastatin, carvedilol, and warfarin -Last lipid panel 01/11/18 TC 269, TG 109, HDL 48, LDL 199 - Patient states that someone had called and told him to stop takingLipitor, buthe has restartedsince last lipid panel  3. LV Thrombus -Continue warfarin - INR followed at the [facility] office  4. Smoking -He has quitsmoking  5. ETOH - Continues to drink wine~2 glasses/night - Continue to encourage cessation  Plan: 1) Medication changes: Based on clinical presentation, vital signs and recent labs will increase carvedilol to 18.75 mg BID. 2) Labs:BMET today 3) Follow-up:Pharmacy clinic on 4/10,Dr. Mclean 04/10/18   [name], PharmD, BCPS, CPP Clinical Pharmacist Phone: [phone] 02/15/2018 8:46 AM

## 2018-02-27 ENCOUNTER — Ambulatory Visit (INDEPENDENT_AMBULATORY_CARE_PROVIDER_SITE_OTHER): Payer: 59 | Admitting: *Deleted

## 2018-02-27 DIAGNOSIS — I513 Intracardiac thrombosis, not elsewhere classified: Secondary | ICD-10-CM | POA: Diagnosis not present

## 2018-02-27 DIAGNOSIS — I240 Acute coronary thrombosis not resulting in myocardial infarction: Secondary | ICD-10-CM

## 2018-02-27 DIAGNOSIS — Z5181 Encounter for therapeutic drug level monitoring: Secondary | ICD-10-CM

## 2018-02-27 LAB — POCT INR: INR: 3.5

## 2018-02-27 NOTE — Patient Instructions (Signed)
Description   Skip today's dose, then Continue taking 1/2 tablet daily except 1 tablet on Monday and Wednesdays. Continue eating 1 serving of dark green leafy veggies weekly.  Recheck in 3 weeks. Call with any new medications or concerns, [phone]

## 2018-02-28 ENCOUNTER — Ambulatory Visit (HOSPITAL_COMMUNITY)
Admission: RE | Admit: 2018-02-28 | Discharge: 2018-02-28 | Disposition: A | Discharge status: Home / Self Care | Payer: 59 | Source: Ambulatory Visit | Attending: Internal Medicine | Admitting: Internal Medicine

## 2018-02-28 DIAGNOSIS — I8291 Chronic embolism and thrombosis of unspecified vein: Secondary | ICD-10-CM | POA: Insufficient documentation

## 2018-02-28 DIAGNOSIS — I2510 Atherosclerotic heart disease of native coronary artery without angina pectoris: Secondary | ICD-10-CM | POA: Insufficient documentation

## 2018-02-28 DIAGNOSIS — I255 Ischemic cardiomyopathy: Secondary | ICD-10-CM | POA: Diagnosis not present

## 2018-02-28 DIAGNOSIS — I5022 Chronic systolic (congestive) heart failure: Secondary | ICD-10-CM | POA: Diagnosis not present

## 2018-02-28 DIAGNOSIS — Z87891 Personal history of nicotine dependence: Secondary | ICD-10-CM | POA: Insufficient documentation

## 2018-02-28 NOTE — Patient Instructions (Signed)
It was great seeing you today!  Please continue your current medications.  Please keep your appointment with Dr. Shirlee Latch on 04/10/18.

## 2018-02-28 NOTE — Progress Notes (Signed)
HF MD: [facility]  HPI:  Shawn Emard Godfreyis a 62 yo Caucasianmalewith a h/o chronic systolic heart failure (08/2017), LV thrombus on coumadin, severe CAD, and former smoker.   Admittedin 10/18with increased dyspnea and fatigue. ECHO showed severely reduced EF ~20%.LHC showed severe coronary disease.Evaluated by Dr Swaziland and Dr Laneta Simmers with recommendations for medical management, for the LAD there was no option for CTO revascularization or CABG. Started on low dose HF meds.  On 01/11/18, ECHO revealed EF 30%,Carvedilolwasincreased to 12.5mg  BID and aspirin was stopped.At visit on 01/19/18, Dr. Ladona Ridgel recommended patient for ICD implantation, patient stated that he will have to upgrade his insurance coverage prior to the procedure, but plans to schedule in April. Hewasnot smoking. He wasdrinking about 2 drinks/day. Walkingabout a mile 3-4 days a week.Tolerated the increase in carvedilol well.  Today he returns to pharmacy clinic for further medication titration since carvedilol was increased to 18.75mg  at last visit on 02/14/18. Patient tolerated this increase well. Patient took his morning medications. Denies SOB walking or climbing stairs.   Shortness of breath/dyspnea on exertion? no   Orthopnea/PND? no  Edema? no  Lightheadedness/dizziness? no  Daily weights at home? Yes - stable 228-230 lbs.  Blood pressure/heart rate monitoring at home? No, patient has but does not monitor at home  Following low-sodium/fluid-restricted diet? yes  HF Medications: Carvedilol 18.75mg  BID Furosemide 20mg  QD Entresto 49-51mg  BID Spironolactone 25mg  QD  Has the patient been experiencing any side effects to the medications prescribed?  no  Does the patient have any problems obtaining medications due to transportation or finances?   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 Patient understands to avoid decongestants.  Pertinent Lab Values:  02/14/18:Serum creatinine 0.83, Potassium 4.7,BUN 14, Sodium 136  01/11/18: Serum creatinine 0.85, Potassium 4.8,TC 269, TG 109, HDL 48, LDL 199.  Vital Signs:  Weight: 229.8 lb (dry weight: 230 lb)  Blood pressure: 100/70 mmHg  Heart rate: 66 bpm  3/27: BP 110/84, HR 67, Weight 232.4lb   ReDS Vest 23%   Assessment: 1. Chronic systolic CHFEF 30%, due toIschemic cardiomyopathy. NYHA class I symptoms. -Volumestatus stable - With soft BP and HR, will continue carvedilol 18.75mg  BID, Entresto49/51 mg BID,spironolactone 25mg  daily,andfurosemide20 mg daily - Consider increasing Entresto at next visit if BP andK+permit - Basic disease state pathophysiology, medication indication, mechanism and side effects reviewed at length with patient and he verbalized understanding 2. CAD - No S/S ischemia - Continue atorvastatin, carvedilol, and warfarin -Last lipid panel 01/11/18 TC 269, TG 109, HDL 48, LDL 199 - Patient states that someone had called and told him to stop takingLipitor, buthe has restartedsince last lipid panel 3. LV Thrombus -Continue warfarin - INR followed at the [facility] office 4. Smoking -He has quitsmoking 5. ETOH - Continues to drink wine~2 glasses/night - Continue to encourage cessation  Plan: 1) Medication changes: Based on clinical presentation, vital signs and recent labs will continue current regimen 2) Labs: PRN 3) Follow-up: 04/10/18 with Dr. Virgilio Belling K. Bonnye Fava, PharmD, BCPS, CPP Clinical Pharmacist Pager: [phone] Phone: [phone] 02/28/2018 12:52 PM

## 2018-03-12 ENCOUNTER — Ambulatory Visit (HOSPITAL_COMMUNITY)
Admission: RE | Admit: 2018-03-12 | Discharge: 2018-03-12 | Disposition: A | Discharge status: Home / Self Care | Payer: 59 | Source: Ambulatory Visit | Attending: Internal Medicine | Admitting: Internal Medicine

## 2018-03-12 DIAGNOSIS — E785 Hyperlipidemia, unspecified: Secondary | ICD-10-CM | POA: Diagnosis not present

## 2018-03-12 DIAGNOSIS — R0600 Dyspnea, unspecified: Secondary | ICD-10-CM

## 2018-03-12 LAB — HEPATIC FUNCTION PANEL
ALT: 30 U/L (ref 17–63)
AST: 25 U/L (ref 15–41)
Albumin: 3.9 g/dL (ref 3.5–5.0)
Alkaline Phosphatase: 67 U/L (ref 38–126)
Bilirubin, Direct: 0.1 mg/dL (ref 0.1–0.5)
Indirect Bilirubin: 0.6 mg/dL (ref 0.3–0.9)
Total Bilirubin: 0.7 mg/dL (ref 0.3–1.2)
Total Protein: 7 g/dL (ref 6.5–8.1)

## 2018-03-12 LAB — LIPID PANEL
Cholesterol: 145 mg/dL (ref 0–200)
HDL: 41 mg/dL (ref >40)
LDL Cholesterol: 89 mg/dL (ref 0–99)
Total CHOL/HDL Ratio: 3.5 RATIO
Triglycerides: 74 mg/dL (ref <150)
VLDL: 15 mg/dL (ref 0–40)

## 2018-03-16 ENCOUNTER — Telehealth (HOSPITAL_COMMUNITY): Payer: Self-pay

## 2018-03-16 ENCOUNTER — Encounter (HOSPITAL_COMMUNITY): Payer: Self-pay

## 2018-03-16 DIAGNOSIS — E785 Hyperlipidemia, unspecified: Secondary | ICD-10-CM

## 2018-03-16 MED ORDER — ROSUVASTATIN CALCIUM 40 MG PO TABS: 40.0000 mg | ORAL_TABLET | Freq: Every day | ORAL | 3 refills | Status: DC

## 2018-03-16 NOTE — Telephone Encounter (Signed)
Notes recorded by Teresa Coombs, RN on 03/16/2018 at 9:21 AM EDT Lipid Letter mailed to patient. agreeable to med changes (changes made in [facility]) (switch to Crestor) and Lab appointment made (orders placed)   ------  Notes recorded by Laurey Morale, MD on 03/12/2018 at 10:55 PM EDT LDL better but still above goal. He can either add Zetia 10 mg daily or switch from atorvastatin 80 mg daily to Crestor 40 mg daily, whichever would be most economical for him. Goal LDL < 70. Needs lipids/LFTs in 2 months.

## 2018-03-20 ENCOUNTER — Ambulatory Visit (INDEPENDENT_AMBULATORY_CARE_PROVIDER_SITE_OTHER): Payer: 59 | Admitting: *Deleted

## 2018-03-20 DIAGNOSIS — Z5181 Encounter for therapeutic drug level monitoring: Secondary | ICD-10-CM

## 2018-03-20 DIAGNOSIS — I513 Intracardiac thrombosis, not elsewhere classified: Secondary | ICD-10-CM | POA: Diagnosis not present

## 2018-03-20 DIAGNOSIS — I240 Acute coronary thrombosis not resulting in myocardial infarction: Secondary | ICD-10-CM

## 2018-03-20 LAB — POCT INR: INR: 2.3

## 2018-03-20 NOTE — Patient Instructions (Signed)
Description   Continue taking 1/2 tablet daily except 1 tablet on Monday and Wednesdays. Continue eating 1 serving of dark green leafy veggies weekly.  Recheck in 4 weeks. Call with any new medications or concerns, [phone]

## 2018-04-10 ENCOUNTER — Ambulatory Visit (HOSPITAL_COMMUNITY)
Admission: RE | Admit: 2018-04-10 | Discharge: 2018-04-10 | Disposition: A | Discharge status: Home / Self Care | Payer: 59 | Source: Ambulatory Visit | Attending: Cardiology | Admitting: Cardiology

## 2018-04-10 ENCOUNTER — Encounter (HOSPITAL_COMMUNITY): Payer: Self-pay | Admitting: Cardiology

## 2018-04-10 VITALS — BP 104/72 | HR 65 | Wt 231.8 lb

## 2018-04-10 DIAGNOSIS — I513 Intracardiac thrombosis, not elsewhere classified: Secondary | ICD-10-CM | POA: Diagnosis not present

## 2018-04-10 DIAGNOSIS — I2582 Chronic total occlusion of coronary artery: Secondary | ICD-10-CM | POA: Insufficient documentation

## 2018-04-10 DIAGNOSIS — I255 Ischemic cardiomyopathy: Secondary | ICD-10-CM | POA: Insufficient documentation

## 2018-04-10 DIAGNOSIS — Z8249 Family history of ischemic heart disease and other diseases of the circulatory system: Secondary | ICD-10-CM | POA: Insufficient documentation

## 2018-04-10 DIAGNOSIS — Z7901 Long term (current) use of anticoagulants: Secondary | ICD-10-CM | POA: Insufficient documentation

## 2018-04-10 DIAGNOSIS — I5022 Chronic systolic (congestive) heart failure: Secondary | ICD-10-CM

## 2018-04-10 DIAGNOSIS — E785 Hyperlipidemia, unspecified: Secondary | ICD-10-CM

## 2018-04-10 DIAGNOSIS — Z87891 Personal history of nicotine dependence: Secondary | ICD-10-CM | POA: Insufficient documentation

## 2018-04-10 DIAGNOSIS — I110 Hypertensive heart disease with heart failure: Secondary | ICD-10-CM | POA: Diagnosis not present

## 2018-04-10 DIAGNOSIS — I2510 Atherosclerotic heart disease of native coronary artery without angina pectoris: Secondary | ICD-10-CM

## 2018-04-10 DIAGNOSIS — Z79899 Other long term (current) drug therapy: Secondary | ICD-10-CM | POA: Insufficient documentation

## 2018-04-10 LAB — CBC
HCT: 43.7 % (ref 39.0–52.0)
Hemoglobin: 14.6 g/dL (ref 13.0–17.0)
MCH: 30.4 pg (ref 26.0–34.0)
MCHC: 33.4 g/dL (ref 30.0–36.0)
MCV: 91 fL (ref 78.0–100.0)
Platelets: 278 10*3/uL (ref 150–400)
RBC: 4.8 MIL/uL (ref 4.22–5.81)
RDW: 12.5 % (ref 11.5–15.5)
WBC: 6.7 10*3/uL (ref 4.0–10.5)

## 2018-04-10 LAB — BASIC METABOLIC PANEL
Anion gap: 8 (ref 5–15)
BUN: 20 mg/dL (ref 6–20)
CO2: 27 mmol/L (ref 22–32)
Calcium: 9.1 mg/dL (ref 8.9–10.3)
Chloride: 101 mmol/L (ref 101–111)
Creatinine, Ser: 1.01 mg/dL (ref 0.61–1.24)
GFR calc Af Amer: >60 mL/min (ref >60)
GFR calc non Af Amer: >60 mL/min (ref >60)
Glucose, Bld: 144 mg/dL — ABNORMAL HIGH (ref 65–99)
Potassium: 5.4 mmol/L — ABNORMAL HIGH (ref 3.5–5.1)
Sodium: 136 mmol/L (ref 135–145)

## 2018-04-10 MED ORDER — SACUBITRIL-VALSARTAN 97-103 MG PO TABS: 1.0000 | ORAL_TABLET | Freq: Two times a day (BID) | ORAL | 3 refills | Status: DC

## 2018-04-10 NOTE — Patient Instructions (Signed)
Stop Furosemide  Increase Entresto 97/103 (1 tab), twice a day  Labs drawn today (if we do not call you, then your lab work was stable)   Your physician recommends that you return for lab work in: 10 days  Your physician recommends that you return for lab work in: 2 months   Your physician recommends that you schedule a follow-up appointment in: 3 months with Dr. Shirlee Latch    Call Dr. Ladona Ridgel about ICD placement

## 2018-04-11 NOTE — Progress Notes (Signed)
Advanced Heart Failure Clinic Note  PCP: Dr Nehemiah Settle  Primary Cardiologist: Dr Shirlee Latch   HPI: Shawn Webster is a 62 y.o. male with a h/o chronic systolic heart failure (08/2017), LV thrombus on coumadin, severe CAD, and former smoker.   Admitted in 10/18 with increased dyspnea and fatigue. ECHO showed severely reduced EF ~20%.  LHC showed severe coronary disease: occluded LAD without collaterals and diffusely diseased LCx.  The LCx territory did not appear viable. Evaluated by Dr Swaziland and Dr Laneta Simmers with recommendations for medical management, for the LAD there was no option for CTO revascularization or CABG. Started on low dose HF meds.   He presents today for followup of CHF and CAD.  Weight is down 2 lbs.  No significant exertional dyspnea. No chest pain. He can climb a flight of stairs without problems. No orthopnea/PND.  No lightheadedness.  He is still drinking 2-3 glasses of wine/night.  He recently transitioned onto Crestor with LDL still above goal.   Labs (10/18): LDL 106 Labs (1/19): K 5.3 => 4.8, creatinine 0.82 Labs (3/19): K 4.7, creatinine 0.83 Labs (4/19): LDL 89  PMH: 1. Chronic systolic CHF: Ischemic cardiomyopathy.  - Echo (10/18): EF 20-25%, diffuse hypokinesis, mildly decreased RV systolic function.  - Cardiac MRI (10/18): Moderately dilated LV with mild LVH, EF 19% with wall motion abnormalities, LV thrombus, LGE pattern suggesting inferolateral wall would not be expected to improve with revascularization of the left circumflex. There did appear to be significant viability within the LAD territory with the exception of the apex. - RHC (10/18): mean RA 8, PA 54/23 mean 34, mean PCWP 22, CI 2.62, PVR 2 WU.  - Echo (2/19): EF 30%, wall motion abnormalities, mild LV dilation, chronic-appearing LV thrombus.  2. LV apical thrombus.  3. CAD: LHC (10/18) with totally occluded LAD with minimal collateralization, diffuse severe disease in LCx. Films reviewed by Dr. Laneta Simmers  and Dr Swazil[place], he was not thought to be candidate for CABG or CTO PCI.   4. Hyperlipidemia 5. HTN 6. H/o Bell's palsy.  7. H/o ETOH abuse   Review of systems complete and found to be negative unless listed in HPI.     Social History   Socioeconomic History  . Marital status: Married    Spouse name: Not on file  . Number of children: Not on file  . Years of education: Not on file  . Highest education level: Not on file  Occupational History  . Not on file  Social Needs  . Financial resource strain: Not on file  . Food insecurity:    Worry: Not on file    Inability: Not on file  . Transportation needs:    Medical: Not on file    Non-medical: Not on file  Tobacco Use  . Smoking status: Former Smoker    Types: Cigars    Last attempt to quit: 08/21/2017    Years since quitting: 0.6  . Smokeless tobacco: Never Used  . Tobacco comment: 1 cigar per day  Substance and Sexual Activity  . Alcohol use: Yes    Comment: 2 martinis per day  . Drug use: No  . Sexual activity: Not on file  Lifestyle  . Physical activity:    Days per week: Not on file    Minutes per session: Not on file  . Stress: Not on file  Relationships  . Social connections:    Talks on phone: Not on file    Gets together: Not  on file    Attends religious service: Not on file    Active member of club or organization: Not on file    Attends meetings of clubs or organizations: Not on file    Relationship status: Not on file  . Intimate partner violence:    Fear of current or ex partner: Not on file    Emotionally abused: Not on file    Physically abused: Not on file    Forced sexual activity: Not on file  Other Topics Concern  . Not on file  Social History Narrative  . Not on file    Family History  Problem Relation Age of Onset  . Dementia Mother   . CAD Father     Past Medical History:  Diagnosis Date  . Bell's palsy   . CHF (congestive heart failure) (HCC)   . Hypertension     Current  Outpatient Medications  Medication Sig Dispense Refill  . carvedilol (COREG) 12.5 MG tablet Take 1.5 tablets (18.75 mg total) by mouth 2 (two) times daily with a meal. 90 tablet 5  . rosuvastatin (CRESTOR) 40 MG tablet Take 1 tablet (40 mg total) by mouth daily. 30 tablet 3  . spironolactone (ALDACTONE) 25 MG tablet Take 1 tablet (25 mg total) by mouth daily. 30 tablet 6  . warfarin (COUMADIN) 10 MG tablet TAKE 1/2 TABLET BY MOUTH DAILY EXCEPT 1 TABLET ON WEDNESDAY OR AS DIRECTED BY COUMADIN CLINIC 30 tablet 2  . sacubitril-valsartan (ENTRESTO) 97-103 MG Take 1 tablet by mouth 2 (two) times daily. 60 tablet 3   No current facility-administered medications for this encounter.     Vitals:   04/10/18 1132  BP: 104/72  Pulse: 65  SpO2: 99%  Weight: 231 lb 12 oz (105.1 kg)   Wt Readings from Last 3 Encounters:  04/10/18 231 lb 12 oz (105.1 kg)  02/28/18 229 lb 12.8 oz (104.2 kg)  02/14/18 232 lb 6.4 oz (105.4 kg)   PHYSICAL EXAM: General: NAD Neck: No JVD, no thyromegaly or thyroid nodule.  Lungs: Clear to auscultation bilaterally with normal respiratory effort. CV: Nondisplaced PMI.  Heart regular S1/S2, no S3/S4, no murmur.  No peripheral edema.  No carotid bruit.  Normal pedal pulses.  Abdomen: Soft, nontender, no hepatosplenomegaly, no distention.  Skin: Intact without lesions or rashes.  Neurologic: Alert and oriented x 3.  Psych: Normal affect. Extremities: No clubbing or cyanosis.  HEENT: Normal.   ASSESSMENT & PLAN: 1. Chronic Systolic Heart Failure: Echo 08/2017 ECHO 20-25%. Ischemic cardiomyopathy and ETOH may be both playing a role. Echo in 2/19 showed that EF remains around 30%.  NYHA class II symptoms.  No volume overload.  - I think that he can stop Lasix and use prn.  - Continue Coreg 18.75 mg bid.   - Increase Entresto to 97/103 bid. BMET today and again in 10 days.  - Continue spironolactone 25 mg daily.  - QRS narrow on ECG so no role for CRT.  However, with  persistently low EF, he has seen EP. He is willing to get an ICD but there are apparently some issues with his insurance that he is trying to work out now.   2. CAD: Severe 2 vessel disease on 10/18 cath. Diffuse, severely diseased LCx system and chronic total occlusion proximal LAD.  Cardiac MRI showed inferolateral aneurysm. Inferolateral wall did not appear viable so suspect intervention on LCx would not be helpful. There did appear to be viability in the anterior and  anteroseptal walls, the true apex was nonviable.  I discussed with Dr. Swaziland and Dr. Laneta Simmers during his 10/18 admission: not candidate for CTO of LAD or CABG.  - Continue Crestor 40 mg daily (started a week ago), check lipids/LFTs again in 2 months.  - Given use of warfarin, not taking ASA.  3. LV Thrombus: Continue warfarin.  4. Smoking: He has quit.  5. ETOH: 2-3 drinks/night, more heavy in the past.   Asked to try to continue to cut back, would like to see at least down to 1-2/day. .     Followup with me in 3 months.   Marca Ancona, MD  04/11/2018

## 2018-04-17 ENCOUNTER — Ambulatory Visit (INDEPENDENT_AMBULATORY_CARE_PROVIDER_SITE_OTHER): Payer: 59 | Admitting: *Deleted

## 2018-04-17 DIAGNOSIS — I513 Intracardiac thrombosis, not elsewhere classified: Secondary | ICD-10-CM

## 2018-04-17 DIAGNOSIS — Z5181 Encounter for therapeutic drug level monitoring: Secondary | ICD-10-CM

## 2018-04-17 DIAGNOSIS — I240 Acute coronary thrombosis not resulting in myocardial infarction: Secondary | ICD-10-CM

## 2018-04-17 LAB — POCT INR: INR: 2.5 (ref 2.0–3.0)

## 2018-04-17 NOTE — Patient Instructions (Signed)
Description   Continue taking 1/2 tablet daily except 1 tablet on Monday and Wednesdays. Continue eating 1 serving of dark green leafy veggies weekly.  Recheck in 4 weeks. Call with any new medications or concerns, [phone]     

## 2018-04-24 ENCOUNTER — Ambulatory Visit (HOSPITAL_COMMUNITY)
Admission: RE | Admit: 2018-04-24 | Discharge: 2018-04-24 | Disposition: A | Discharge status: Home / Self Care | Payer: 59 | Source: Ambulatory Visit | Attending: Cardiology | Admitting: Cardiology

## 2018-04-24 DIAGNOSIS — E785 Hyperlipidemia, unspecified: Secondary | ICD-10-CM | POA: Insufficient documentation

## 2018-04-24 DIAGNOSIS — I5022 Chronic systolic (congestive) heart failure: Secondary | ICD-10-CM | POA: Insufficient documentation

## 2018-04-24 LAB — HEPATIC FUNCTION PANEL
ALT: 22 U/L (ref 17–63)
AST: 20 U/L (ref 15–41)
Albumin: 3.6 g/dL (ref 3.5–5.0)
Alkaline Phosphatase: 55 U/L (ref 38–126)
Bilirubin, Direct: 0.1 mg/dL (ref 0.1–0.5)
Indirect Bilirubin: 0.5 mg/dL (ref 0.3–0.9)
Total Bilirubin: 0.6 mg/dL (ref 0.3–1.2)
Total Protein: 6.7 g/dL (ref 6.5–8.1)

## 2018-04-24 LAB — BASIC METABOLIC PANEL
Anion gap: 4 — ABNORMAL LOW (ref 5–15)
BUN: 18 mg/dL (ref 6–20)
CO2: 28 mmol/L (ref 22–32)
Calcium: 8.6 mg/dL — ABNORMAL LOW (ref 8.9–10.3)
Chloride: 110 mmol/L (ref 101–111)
Creatinine, Ser: 1.05 mg/dL (ref 0.61–1.24)
GFR calc Af Amer: >60 mL/min (ref >60)
GFR calc non Af Amer: >60 mL/min (ref >60)
Glucose, Bld: 133 mg/dL — ABNORMAL HIGH (ref 65–99)
Potassium: 5.1 mmol/L (ref 3.5–5.1)
Sodium: 142 mmol/L (ref 135–145)

## 2018-04-24 LAB — LIPID PANEL
Cholesterol: 123 mg/dL (ref 0–200)
HDL: 46 mg/dL (ref >40)
LDL Cholesterol: 66 mg/dL (ref 0–99)
Total CHOL/HDL Ratio: 2.7 RATIO
Triglycerides: 54 mg/dL (ref <150)
VLDL: 11 mg/dL (ref 0–40)

## 2018-04-25 ENCOUNTER — Encounter (HOSPITAL_COMMUNITY): Payer: Self-pay

## 2018-05-15 ENCOUNTER — Ambulatory Visit (INDEPENDENT_AMBULATORY_CARE_PROVIDER_SITE_OTHER): Payer: 59

## 2018-05-15 DIAGNOSIS — I513 Intracardiac thrombosis, not elsewhere classified: Secondary | ICD-10-CM

## 2018-05-15 DIAGNOSIS — I240 Acute coronary thrombosis not resulting in myocardial infarction: Secondary | ICD-10-CM

## 2018-05-15 DIAGNOSIS — Z5181 Encounter for therapeutic drug level monitoring: Secondary | ICD-10-CM | POA: Diagnosis not present

## 2018-05-15 LAB — POCT INR: INR: 2.7 (ref 2.0–3.0)

## 2018-05-15 NOTE — Patient Instructions (Signed)
Description   Continue taking 1/2 tablet daily except 1 tablet on Mondays and Wednesdays. Continue eating 1 serving of dark green leafy veggies weekly.  Recheck in 6 weeks. Call with any new medications or concerns, [phone]

## 2018-05-16 ENCOUNTER — Other Ambulatory Visit (HOSPITAL_COMMUNITY): Payer: 59

## 2018-06-11 ENCOUNTER — Other Ambulatory Visit (HOSPITAL_COMMUNITY): Payer: 59

## 2018-06-12 ENCOUNTER — Ambulatory Visit (HOSPITAL_COMMUNITY)
Admission: RE | Admit: 2018-06-12 | Discharge: 2018-06-12 | Disposition: A | Discharge status: Home / Self Care | Payer: 59 | Source: Ambulatory Visit | Attending: Cardiology | Admitting: Cardiology

## 2018-06-12 DIAGNOSIS — E785 Hyperlipidemia, unspecified: Secondary | ICD-10-CM | POA: Diagnosis not present

## 2018-06-12 LAB — LIPID PANEL
Cholesterol: 113 mg/dL (ref 0–200)
HDL: 51 mg/dL (ref >40)
LDL Cholesterol: 50 mg/dL (ref 0–99)
Total CHOL/HDL Ratio: 2.2 RATIO
Triglycerides: 60 mg/dL (ref <150)
VLDL: 12 mg/dL (ref 0–40)

## 2018-06-12 LAB — HEPATIC FUNCTION PANEL
ALT: 23 U/L (ref 0–44)
AST: 20 U/L (ref 15–41)
Albumin: 3.7 g/dL (ref 3.5–5.0)
Alkaline Phosphatase: 50 U/L (ref 38–126)
Bilirubin, Direct: 0.2 mg/dL (ref 0.0–0.2)
Indirect Bilirubin: 0.7 mg/dL (ref 0.3–0.9)
Total Bilirubin: 0.9 mg/dL (ref 0.3–1.2)
Total Protein: 6.7 g/dL (ref 6.5–8.1)

## 2018-06-19 ENCOUNTER — Telehealth: Payer: Self-pay | Admitting: Internal Medicine

## 2018-06-19 NOTE — Telephone Encounter (Signed)
Replied via MyChart.  Closing this as duplicate.

## 2018-06-19 NOTE — Telephone Encounter (Signed)
New Message   Pt states he needs help scheduling a defib install and wants to know Dr. Lubertha Basque availability for august and september

## 2018-06-27 ENCOUNTER — Other Ambulatory Visit: Payer: Self-pay

## 2018-06-27 DIAGNOSIS — I5022 Chronic systolic (congestive) heart failure: Secondary | ICD-10-CM

## 2018-06-27 NOTE — Progress Notes (Signed)
Pre-procedure labs placed.

## 2018-06-28 ENCOUNTER — Ambulatory Visit (INDEPENDENT_AMBULATORY_CARE_PROVIDER_SITE_OTHER): Payer: 59 | Admitting: *Deleted

## 2018-06-28 DIAGNOSIS — Z5181 Encounter for therapeutic drug level monitoring: Secondary | ICD-10-CM | POA: Diagnosis not present

## 2018-06-28 DIAGNOSIS — I513 Intracardiac thrombosis, not elsewhere classified: Secondary | ICD-10-CM | POA: Diagnosis not present

## 2018-06-28 DIAGNOSIS — I240 Acute coronary thrombosis not resulting in myocardial infarction: Secondary | ICD-10-CM

## 2018-06-28 LAB — POCT INR: INR: 3.2 — AB (ref 2.0–3.0)

## 2018-06-28 NOTE — Patient Instructions (Signed)
Description   Skip today's dose, then Continue taking 1/2 tablet daily except 1 tablet on Mondays and Wednesdays. Continue eating 1 serving of dark green leafy veggies weekly.  Recheck in 4 weeks. Call with any new medications or concerns, [phone]

## 2018-07-03 ENCOUNTER — Other Ambulatory Visit: Payer: Self-pay | Admitting: Cardiology

## 2018-07-16 ENCOUNTER — Encounter (HOSPITAL_COMMUNITY): Payer: Self-pay | Admitting: Cardiology

## 2018-07-16 ENCOUNTER — Other Ambulatory Visit: Payer: Self-pay

## 2018-07-16 ENCOUNTER — Ambulatory Visit (HOSPITAL_COMMUNITY)
Admission: RE | Admit: 2018-07-16 | Discharge: 2018-07-16 | Disposition: A | Discharge status: Home / Self Care | Payer: 59 | Source: Ambulatory Visit | Attending: Cardiology | Admitting: Cardiology

## 2018-07-16 VITALS — BP 101/66 | HR 69 | Wt 236.0 lb

## 2018-07-16 DIAGNOSIS — Z79899 Other long term (current) drug therapy: Secondary | ICD-10-CM | POA: Diagnosis not present

## 2018-07-16 DIAGNOSIS — Z7901 Long term (current) use of anticoagulants: Secondary | ICD-10-CM | POA: Diagnosis not present

## 2018-07-16 DIAGNOSIS — I5022 Chronic systolic (congestive) heart failure: Secondary | ICD-10-CM | POA: Insufficient documentation

## 2018-07-16 DIAGNOSIS — Z95810 Presence of automatic (implantable) cardiac defibrillator: Secondary | ICD-10-CM | POA: Insufficient documentation

## 2018-07-16 DIAGNOSIS — I2582 Chronic total occlusion of coronary artery: Secondary | ICD-10-CM | POA: Diagnosis not present

## 2018-07-16 DIAGNOSIS — Z86718 Personal history of other venous thrombosis and embolism: Secondary | ICD-10-CM | POA: Diagnosis not present

## 2018-07-16 DIAGNOSIS — I2510 Atherosclerotic heart disease of native coronary artery without angina pectoris: Secondary | ICD-10-CM | POA: Insufficient documentation

## 2018-07-16 DIAGNOSIS — I110 Hypertensive heart disease with heart failure: Secondary | ICD-10-CM | POA: Insufficient documentation

## 2018-07-16 DIAGNOSIS — I255 Ischemic cardiomyopathy: Secondary | ICD-10-CM | POA: Insufficient documentation

## 2018-07-16 DIAGNOSIS — E785 Hyperlipidemia, unspecified: Secondary | ICD-10-CM | POA: Insufficient documentation

## 2018-07-16 DIAGNOSIS — Z87891 Personal history of nicotine dependence: Secondary | ICD-10-CM | POA: Insufficient documentation

## 2018-07-16 LAB — BASIC METABOLIC PANEL
Anion gap: 10 (ref 5–15)
BUN: 15 mg/dL (ref 8–23)
CO2: 24 mmol/L (ref 22–32)
Calcium: 9.1 mg/dL (ref 8.9–10.3)
Chloride: 104 mmol/L (ref 98–111)
Creatinine, Ser: 0.94 mg/dL (ref 0.61–1.24)
GFR calc Af Amer: >60 mL/min (ref >60)
GFR calc non Af Amer: >60 mL/min (ref >60)
Glucose, Bld: 105 mg/dL — ABNORMAL HIGH (ref 70–99)
Potassium: 5 mmol/L (ref 3.5–5.1)
Sodium: 138 mmol/L (ref 135–145)

## 2018-07-16 LAB — CBC
HCT: 43.3 % (ref 39.0–52.0)
Hemoglobin: 13.8 g/dL (ref 13.0–17.0)
MCH: 30.1 pg (ref 26.0–34.0)
MCHC: 31.9 g/dL (ref 30.0–36.0)
MCV: 94.3 fL (ref 78.0–100.0)
Platelets: 254 10*3/uL (ref 150–400)
RBC: 4.59 MIL/uL (ref 4.22–5.81)
RDW: 13.9 % (ref 11.5–15.5)
WBC: 5.3 10*3/uL (ref 4.0–10.5)

## 2018-07-16 NOTE — Patient Instructions (Signed)
Labs today (will call for abnormal results, otherwise no news is good news)  Follow in 4 months.

## 2018-07-17 NOTE — Progress Notes (Signed)
Advanced Heart Failure Clinic Note  PCP: Dr Nehemiah Settle  Primary Cardiologist: Dr Shirlee Latch   HPI: Shawn Webster is a 62 y.o. male with a h/o chronic systolic heart failure (08/2017), LV thrombus on coumadin, severe CAD, and former smoker.   Admitted in 10/18 with increased dyspnea and fatigue. ECHO showed severely reduced EF ~20%.  LHC showed severe coronary disease: occluded LAD without collaterals and diffusely diseased LCx.  The LCx territory did not appear viable. Evaluated by Dr Swaziland and Dr Laneta Simmers with recommendations for medical management, for the LAD there was no option for CTO revascularization or CABG. Started on HF meds.   He presents today for followup of CHF and CAD.  He has been doing well recently.  Playing golf.  No chest pain.  No exertional dyspnea.  No orthopnea/PND.  Occasionally notes lightheadedness if he bends over then stands up fast. No BRBPR/melena.  Weight is up a bit today but he thinks this is due to his diet.   Labs (10/18): LDL 106 Labs (1/19): K 5.3 => 4.8, creatinine 0.82 Labs (3/19): K 4.7, creatinine 0.83 Labs (4/19): LDL 89 Labs (6/19): K 5.1, creatinine 1.05 Labs (7/19): LDL 50, HDL 51, LFTs normal  PMH: 1. Chronic systolic CHF: Ischemic cardiomyopathy.  - Echo (10/18): EF 20-25%, diffuse hypokinesis, mildly decreased RV systolic function.  - Cardiac MRI (10/18): Moderately dilated LV with mild LVH, EF 19% with wall motion abnormalities, LV thrombus, LGE pattern suggesting inferolateral wall would not be expected to improve with revascularization of the left circumflex. There did appear to be significant viability within the LAD territory with the exception of the apex. - RHC (10/18): mean RA 8, PA 54/23 mean 34, mean PCWP 22, CI 2.62, PVR 2 WU.  - Echo (2/19): EF 30%, wall motion abnormalities, mild LV dilation, chronic-appearing LV thrombus.  2. LV apical thrombus.  3. CAD: LHC (10/18) with totally occluded LAD with minimal collateralization,  diffuse severe disease in LCx. Films reviewed by Dr. Laneta Simmers and Dr Swaziland, he was not thought to be candidate for CABG or CTO PCI.   4. Hyperlipidemia 5. HTN 6. H/o Bell's palsy.  7. H/o ETOH abuse   Review of systems complete and found to be negative unless listed in HPI.     Social History   Socioeconomic History  . Marital status: Married    Spouse name: Not on file  . Number of children: Not on file  . Years of education: Not on file  . Highest education level: Not on file  Occupational History  . Not on file  Social Needs  . Financial resource strain: Not on file  . Food insecurity:    Worry: Not on file    Inability: Not on file  . Transportation needs:    Medical: Not on file    Non-medical: Not on file  Tobacco Use  . Smoking status: Former Smoker    Types: Cigars    Last attempt to quit: 08/21/2017    Years since quitting: 0.9  . Smokeless tobacco: Never Used  . Tobacco comment: 1 cigar per day  Substance and Sexual Activity  . Alcohol use: Yes    Comment: 2 martinis per day  . Drug use: No  . Sexual activity: Not on file  Lifestyle  . Physical activity:    Days per week: Not on file    Minutes per session: Not on file  . Stress: Not on file  Relationships  . Social connections:  Talks on phone: Not on file    Gets together: Not on file    Attends religious service: Not on file    Active member of club or organization: Not on file    Attends meetings of clubs or organizations: Not on file    Relationship status: Not on file  . Intimate partner violence:    Fear of current or ex partner: Not on file    Emotionally abused: Not on file    Physically abused: Not on file    Forced sexual activity: Not on file  Other Topics Concern  . Not on file  Social History Narrative  . Not on file    Family History  Problem Relation Age of Onset  . Dementia Mother   . CAD Father     Past Medical History:  Diagnosis Date  . Bell's palsy   . CHF  (congestive heart failure) (HCC)   . Hypertension     Current Outpatient Medications  Medication Sig Dispense Refill  . carvedilol (COREG) 12.5 MG tablet Take 1.5 tablets (18.75 mg total) by mouth 2 (two) times daily with a meal. 90 tablet 5  . rosuvastatin (CRESTOR) 40 MG tablet Take 1 tablet (40 mg total) by mouth daily. 30 tablet 3  . sacubitril-valsartan (ENTRESTO) 97-103 MG Take 1 tablet by mouth 2 (two) times daily. 60 tablet 3  . spironolactone (ALDACTONE) 25 MG tablet Take 1 tablet (25 mg total) by mouth daily. 30 tablet 6  . warfarin (COUMADIN) 10 MG tablet TAKE 1/2 TABLET BY MOUTH DAILY EXCEPT 1 TABLET ON WEDNESDAY OR AS DIRECTED BY COUMADIN CLINIC 30 tablet 1   No current facility-administered medications for this encounter.     Vitals:   07/16/18 1110  BP: 101/66  Pulse: 69  SpO2: 98%  Weight: 107 kg (236 lb)   Wt Readings from Last 3 Encounters:  07/16/18 107 kg (236 lb)  04/10/18 105.1 kg (231 lb 12 oz)  02/28/18 104.2 kg (229 lb 12.8 oz)   PHYSICAL EXAM: General: NAD Neck: No JVD, no thyromegaly or thyroid nodule.  Lungs: Clear to auscultation bilaterally with normal respiratory effort. CV: Nondisplaced PMI.  Heart regular S1/S2, no S3/S4, no murmur.  Trace ankle edema.  No carotid bruit.  Normal pedal pulses.  Abdomen: Soft, nontender, no hepatosplenomegaly, no distention.  Skin: Intact without lesions or rashes.  Neurologic: Alert and oriented x 3.  Psych: Normal affect. Extremities: No clubbing or cyanosis.  HEENT: Normal.   ASSESSMENT & PLAN: 1. Chronic Systolic Heart Failure: Echo 08/2017 ECHO 20-25%. Ischemic cardiomyopathy and ETOH may be both playing a role. Echo in 2/19 showed that EF remains around 30%.  NYHA class II symptoms.  No volume overload on exam though weight is up some (think this is caloric).  - Continue to use Lasix prn.  - Continue Coreg 18.75 mg bid.  No BP room to increase.   - Increase Entresto to 97/103 bid. BMET today.  -  Continue spironolactone 25 mg daily.  - QRS narrow on ECG so no role for CRT.  However, with persistently low EF, he has seen EP. He will have ICD placed in 9/19.    2. CAD: Severe 2 vessel disease on 10/18 cath. Diffuse, severely diseased LCx system and chronic total occlusion proximal LAD.  Cardiac MRI showed inferolateral aneurysm. Inferolateral wall did not appear viable so suspect intervention on LCx would not be helpful. There did appear to be viability in the anterior and anteroseptal  walls, the true apex was nonviable.  I discussed with Dr. Swaziland and Dr. Laneta Simmers during his 10/18 admission: not candidate for CTO of LAD or CABG.  - Continue Crestor 40 mg daily, good lipids in 7/19.  - Given use of warfarin, not taking ASA.  3. LV Thrombus: Continue warfarin.  4. Smoking: He has quit.  5. ETOH: 2-3 drinks/night, more heavy in the past.   Asked to try to continue to cut back, would like to see at least down to 1-2/day.     Followup with me in 4 months.   Marca Ancona, MD  07/17/2018

## 2018-07-25 ENCOUNTER — Other Ambulatory Visit: Payer: 59 | Admitting: *Deleted

## 2018-07-25 ENCOUNTER — Ambulatory Visit (INDEPENDENT_AMBULATORY_CARE_PROVIDER_SITE_OTHER): Payer: 59 | Admitting: *Deleted

## 2018-07-25 DIAGNOSIS — I5022 Chronic systolic (congestive) heart failure: Secondary | ICD-10-CM

## 2018-07-25 DIAGNOSIS — I240 Acute coronary thrombosis not resulting in myocardial infarction: Secondary | ICD-10-CM

## 2018-07-25 DIAGNOSIS — I513 Intracardiac thrombosis, not elsewhere classified: Secondary | ICD-10-CM | POA: Diagnosis not present

## 2018-07-25 DIAGNOSIS — Z5181 Encounter for therapeutic drug level monitoring: Secondary | ICD-10-CM

## 2018-07-25 LAB — POCT INR: INR: 2 (ref 2.0–3.0)

## 2018-07-25 MED ORDER — WARFARIN SODIUM 10 MG PO TABS: ORAL_TABLET | ORAL | 3 refills | Status: DC

## 2018-07-25 NOTE — Patient Instructions (Signed)
Description   Continue taking 1/2 tablet daily except 1 tablet on Mondays and Wednesdays. Take last dose on 07/28/18 for procedure on 07/31/18. Resume after procedure per Dr Lubertha Basque instructions, usually the same night at procedure unless specified different. Continue eating 1 serving of dark green leafy veggies weekly.  Recheck in 2 weeks. Call with any new medications or concerns, [phone]

## 2018-07-26 LAB — CBC WITH DIFFERENTIAL/PLATELET
Basophils Absolute: 0 10*3/uL (ref 0.0–0.2)
Basos: 0 %
EOS (ABSOLUTE): 0.1 10*3/uL (ref 0.0–0.4)
Eos: 3 %
Hematocrit: 38.4 % (ref 37.5–51.0)
Hemoglobin: 13.1 g/dL (ref 13.0–17.7)
Immature Grans (Abs): 0 10*3/uL (ref 0.0–0.1)
Immature Granulocytes: 0 %
Lymphocytes Absolute: 1.7 10*3/uL (ref 0.7–3.1)
Lymphs: 33 %
MCH: 30.5 pg (ref 26.6–33.0)
MCHC: 34.1 g/dL (ref 31.5–35.7)
MCV: 90 fL (ref 79–97)
Monocytes Absolute: 0.4 10*3/uL (ref 0.1–0.9)
Monocytes: 7 %
Neutrophils Absolute: 2.9 10*3/uL (ref 1.4–7.0)
Neutrophils: 57 %
Platelets: 249 10*3/uL (ref 150–450)
RBC: 4.29 x10E6/uL (ref 4.14–5.80)
RDW: 14.5 % (ref 12.3–15.4)
WBC: 5.1 10*3/uL (ref 3.4–10.8)

## 2018-07-26 LAB — BASIC METABOLIC PANEL
BUN/Creatinine Ratio: 16 (ref 10–24)
BUN: 16 mg/dL (ref 8–27)
CO2: 21 mmol/L (ref 20–29)
Calcium: 9.2 mg/dL (ref 8.6–10.2)
Chloride: 102 mmol/L (ref 96–106)
Creatinine, Ser: 0.97 mg/dL (ref 0.76–1.27)
GFR calc Af Amer: 97 mL/min/{1.73_m2} (ref >59)
GFR calc non Af Amer: 84 mL/min/{1.73_m2} (ref >59)
Glucose: 118 mg/dL — ABNORMAL HIGH (ref 65–99)
Potassium: 4.6 mmol/L (ref 3.5–5.2)
Sodium: 140 mmol/L (ref 134–144)

## 2018-07-26 LAB — PROTIME-INR
INR: 1.9 — ABNORMAL HIGH (ref 0.8–1.2)
Prothrombin Time: 19.2 s — ABNORMAL HIGH (ref 9.1–12.0)

## 2018-07-31 ENCOUNTER — Other Ambulatory Visit: Payer: Self-pay

## 2018-07-31 ENCOUNTER — Ambulatory Visit (HOSPITAL_COMMUNITY)
Admission: RE | Disposition: A | Discharge status: Home / Self Care | Payer: Self-pay | Source: Ambulatory Visit | Attending: Internal Medicine

## 2018-07-31 ENCOUNTER — Encounter (HOSPITAL_COMMUNITY): Payer: Self-pay | Admitting: Internal Medicine

## 2018-07-31 ENCOUNTER — Ambulatory Visit (HOSPITAL_COMMUNITY)
Admission: RE | Admit: 2018-07-31 | Discharge: 2018-08-01 | Disposition: A | Discharge status: Home / Self Care | Payer: 59 | Source: Ambulatory Visit | Attending: Internal Medicine | Admitting: Internal Medicine

## 2018-07-31 DIAGNOSIS — Z23 Encounter for immunization: Secondary | ICD-10-CM | POA: Insufficient documentation

## 2018-07-31 DIAGNOSIS — Z006 Encounter for examination for normal comparison and control in clinical research program: Secondary | ICD-10-CM | POA: Diagnosis not present

## 2018-07-31 DIAGNOSIS — Z79899 Other long term (current) drug therapy: Secondary | ICD-10-CM | POA: Diagnosis not present

## 2018-07-31 DIAGNOSIS — Z8249 Family history of ischemic heart disease and other diseases of the circulatory system: Secondary | ICD-10-CM | POA: Diagnosis not present

## 2018-07-31 DIAGNOSIS — G510 Bell's palsy: Secondary | ICD-10-CM | POA: Insufficient documentation

## 2018-07-31 DIAGNOSIS — I5022 Chronic systolic (congestive) heart failure: Secondary | ICD-10-CM | POA: Diagnosis present

## 2018-07-31 DIAGNOSIS — Z9889 Other specified postprocedural states: Secondary | ICD-10-CM | POA: Insufficient documentation

## 2018-07-31 DIAGNOSIS — I255 Ischemic cardiomyopathy: Secondary | ICD-10-CM

## 2018-07-31 DIAGNOSIS — Z95810 Presence of automatic (implantable) cardiac defibrillator: Secondary | ICD-10-CM

## 2018-07-31 DIAGNOSIS — Z7901 Long term (current) use of anticoagulants: Secondary | ICD-10-CM | POA: Diagnosis not present

## 2018-07-31 DIAGNOSIS — Z955 Presence of coronary angioplasty implant and graft: Secondary | ICD-10-CM | POA: Insufficient documentation

## 2018-07-31 DIAGNOSIS — I110 Hypertensive heart disease with heart failure: Secondary | ICD-10-CM | POA: Insufficient documentation

## 2018-07-31 DIAGNOSIS — I2510 Atherosclerotic heart disease of native coronary artery without angina pectoris: Secondary | ICD-10-CM | POA: Insufficient documentation

## 2018-07-31 DIAGNOSIS — Z87891 Personal history of nicotine dependence: Secondary | ICD-10-CM | POA: Insufficient documentation

## 2018-07-31 HISTORY — PX: ICD IMPLANT: EP1208

## 2018-07-31 LAB — GLUCOSE, CAPILLARY
Glucose-Capillary: 86 mg/dL (ref 70–99)
Glucose-Capillary: 135 mg/dL — ABNORMAL HIGH (ref 70–99)
Glucose-Capillary: 102 mg/dL — ABNORMAL HIGH (ref 70–99)

## 2018-07-31 LAB — PROTIME-INR
INR: 1.36
Prothrombin Time: 16.6 seconds — ABNORMAL HIGH (ref 11.4–15.2)

## 2018-07-31 SURGERY — ICD IMPLANT
Anesthesia: LOCAL

## 2018-07-31 MED ORDER — MIDAZOLAM HCL 5 MG/5ML IJ SOLN
INTRAMUSCULAR | Status: AC
  Filled 2018-07-31: qty 5

## 2018-07-31 MED ORDER — LIDOCAINE HCL (PF) 1 % IJ SOLN
INTRAMUSCULAR | Status: AC
  Filled 2018-07-31: qty 30

## 2018-07-31 MED ORDER — WARFARIN SODIUM 5 MG PO TABS: 5.0000 mg | ORAL_TABLET | ORAL | Status: DC

## 2018-07-31 MED ORDER — GLIMEPIRIDE 4 MG PO TABS
2.0000 mg | ORAL_TABLET | Freq: Every day | ORAL | Status: DC
  Administered 2018-08-01: 2 mg via ORAL
  Filled 2018-07-31: qty 1

## 2018-07-31 MED ORDER — SODIUM CHLORIDE 0.9 % IV SOLN
80.0000 mg | INTRAVENOUS | Status: DC
  Filled 2018-07-31: qty 2

## 2018-07-31 MED ORDER — SODIUM CHLORIDE 0.9 % IV SOLN
INTRAVENOUS | Status: AC
  Filled 2018-07-31: qty 2

## 2018-07-31 MED ORDER — MUPIROCIN 2 % EX OINT
TOPICAL_OINTMENT | CUTANEOUS | Status: AC
  Filled 2018-07-31: qty 22

## 2018-07-31 MED ORDER — SACUBITRIL-VALSARTAN 97-103 MG PO TABS
1.0000 | ORAL_TABLET | Freq: Two times a day (BID) | ORAL | Status: DC
  Administered 2018-07-31 – 2018-08-01 (×2): 1 via ORAL
  Filled 2018-07-31 (×2): qty 1

## 2018-07-31 MED ORDER — CEFAZOLIN SODIUM-DEXTROSE 2-4 GM/100ML-% IV SOLN
2.0000 g | INTRAVENOUS | Status: DC
  Filled 2018-07-31: qty 100

## 2018-07-31 MED ORDER — WARFARIN - PHYSICIAN DOSING INPATIENT: Freq: Every day | Status: DC

## 2018-07-31 MED ORDER — INFLUENZA VAC SPLIT QUAD 0.5 ML IM SUSY
0.5000 mL | PREFILLED_SYRINGE | INTRAMUSCULAR | Status: AC
  Administered 2018-08-01: 0.5 mL via INTRAMUSCULAR
  Filled 2018-07-31: qty 0.5

## 2018-07-31 MED ORDER — CEFAZOLIN SODIUM-DEXTROSE 1-4 GM/50ML-% IV SOLN
1.0000 g | Freq: Four times a day (QID) | INTRAVENOUS | Status: AC
  Administered 2018-07-31 – 2018-08-01 (×3): 1 g via INTRAVENOUS
  Filled 2018-07-31 (×3): qty 50

## 2018-07-31 MED ORDER — LIDOCAINE HCL (PF) 1 % IJ SOLN
INTRAMUSCULAR | Status: DC | PRN
  Administered 2018-07-31: 30 mL

## 2018-07-31 MED ORDER — HEPARIN (PORCINE) IN NACL 1000-0.9 UT/500ML-% IV SOLN
INTRAVENOUS | Status: DC | PRN
  Administered 2018-07-31: 500 mL

## 2018-07-31 MED ORDER — FENTANYL CITRATE (PF) 100 MCG/2ML IJ SOLN
INTRAMUSCULAR | Status: DC | PRN
  Administered 2018-07-31: 12.5 ug via INTRAVENOUS
  Administered 2018-07-31: 25 ug via INTRAVENOUS
  Administered 2018-07-31: 12.5 ug via INTRAVENOUS

## 2018-07-31 MED ORDER — MIDAZOLAM HCL 5 MG/5ML IJ SOLN
INTRAMUSCULAR | Status: DC | PRN
  Administered 2018-07-31 (×2): 1 mg via INTRAVENOUS
  Administered 2018-07-31: 2 mg via INTRAVENOUS
  Administered 2018-07-31: 1 mg via INTRAVENOUS

## 2018-07-31 MED ORDER — CHLORHEXIDINE GLUCONATE 4 % EX LIQD
60.0000 mL | Freq: Once | CUTANEOUS | Status: DC
  Filled 2018-07-31: qty 60

## 2018-07-31 MED ORDER — CEFAZOLIN SODIUM-DEXTROSE 2-3 GM-%(50ML) IV SOLR
INTRAVENOUS | Status: DC | PRN
  Administered 2018-07-31: 2 g via INTRAVENOUS

## 2018-07-31 MED ORDER — ROSUVASTATIN CALCIUM 40 MG PO TABS
40.0000 mg | ORAL_TABLET | Freq: Every day | ORAL | Status: DC
  Administered 2018-07-31: 40 mg via ORAL
  Filled 2018-07-31: qty 1

## 2018-07-31 MED ORDER — FENTANYL CITRATE (PF) 100 MCG/2ML IJ SOLN
INTRAMUSCULAR | Status: AC
  Filled 2018-07-31: qty 2

## 2018-07-31 MED ORDER — SPIRONOLACTONE 25 MG PO TABS
25.0000 mg | ORAL_TABLET | Freq: Every day | ORAL | Status: DC
  Administered 2018-07-31 – 2018-08-01 (×2): 25 mg via ORAL
  Filled 2018-07-31 (×2): qty 1

## 2018-07-31 MED ORDER — HEPARIN (PORCINE) IN NACL 1000-0.9 UT/500ML-% IV SOLN
INTRAVENOUS | Status: AC
  Filled 2018-07-31: qty 500

## 2018-07-31 MED ORDER — OXYCODONE HCL 5 MG PO TABS
5.0000 mg | ORAL_TABLET | Freq: Four times a day (QID) | ORAL | Status: DC | PRN
  Administered 2018-07-31: 5 mg via ORAL
  Filled 2018-07-31: qty 1

## 2018-07-31 MED ORDER — SODIUM CHLORIDE 0.9 % IV SOLN
INTRAVENOUS | Status: DC
  Administered 2018-07-31: 08:00:00 via INTRAVENOUS

## 2018-07-31 MED ORDER — WARFARIN SODIUM 10 MG PO TABS
10.0000 mg | ORAL_TABLET | ORAL | Status: DC
  Administered 2018-07-31: 10 mg via ORAL
  Filled 2018-07-31: qty 1

## 2018-07-31 MED ORDER — ACETAMINOPHEN 325 MG PO TABS
325.0000 mg | ORAL_TABLET | ORAL | Status: DC | PRN
  Administered 2018-07-31: 650 mg via ORAL
  Filled 2018-07-31: qty 2

## 2018-07-31 MED ORDER — CARVEDILOL 6.25 MG PO TABS
18.7500 mg | ORAL_TABLET | Freq: Two times a day (BID) | ORAL | Status: DC
  Administered 2018-07-31 – 2018-08-01 (×2): 18.75 mg via ORAL
  Filled 2018-07-31 (×2): qty 1

## 2018-07-31 MED ORDER — MUPIROCIN 2 % EX OINT
TOPICAL_OINTMENT | Freq: Once | CUTANEOUS | Status: AC
  Administered 2018-07-31: 1 via NASAL
  Filled 2018-07-31: qty 22

## 2018-07-31 MED ORDER — ONDANSETRON HCL 4 MG/2ML IJ SOLN: 4.0000 mg | Freq: Four times a day (QID) | INTRAMUSCULAR | Status: DC | PRN

## 2018-07-31 MED ORDER — CEFAZOLIN SODIUM-DEXTROSE 2-4 GM/100ML-% IV SOLN
INTRAVENOUS | Status: AC
  Filled 2018-07-31: qty 100

## 2018-07-31 SURGICAL SUPPLY — 9 items
CABLE SURGICAL S-101-97-12 (CABLE) ×2 IMPLANT
ICD VIGILANT DR D233 (Pacemaker) ×1 IMPLANT
INGEVITY MRI 7741-52CM (Lead) ×2 IMPLANT
LEAD PACING INGEVITY MRI 52CM (Lead) IMPLANT
LEAD RELIANCE G DF4 0293 (Lead) ×1 IMPLANT
PAD PRO RADIOLUCENT 2001M-C (PAD) ×2 IMPLANT
SHEATH CLASSIC 7F (SHEATH) ×1 IMPLANT
SHEATH CLASSIC 9F (SHEATH) ×1 IMPLANT
TRAY PACEMAKER INSERTION (PACKS) ×2 IMPLANT

## 2018-07-31 NOTE — H&P (Signed)
HPI Shawn Webster is referred today for evaluation and consideration for ICD insertion. 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 He has severe LV dysfunction with an EF of 20% and an inferolateral wall aneurysm. He has severe 2 vessel CAD, but was not thought to be a candidate for revascularization. He has not had syncope. He has class 2 CHF symptoms. His QRS is narrow No Known Allergies         Current Outpatient Medications  Medication Sig Dispense Refill  . atorvastatin (LIPITOR) 80 MG tablet Take 1 tablet (80 mg total) by mouth daily at 6 PM. 30 tablet 6  . carvedilol (COREG) 12.5 MG tablet Take 1 tablet (12.5 mg total) by mouth 2 (two) times daily with a meal. 60 tablet 3  . furosemide (LASIX) 40 MG tablet Take 0.5 tablets (20 mg total) by mouth daily. 15 tablet 6  . sacubitril-valsartan (ENTRESTO) 49-51 MG Take 1 tablet by mouth 2 (two) times daily. 60 tablet 6  . spironolactone (ALDACTONE) 25 MG tablet Take 0.5 tablets (12.5 mg total) by mouth daily. 15 tablet 6  . warfarin (COUMADIN) 10 MG tablet Take 1/2 tab daily except 1 tab on Wednesday or as Directed by Coumadin Clinic 30 tablet 1   No current facility-administered medications for this visit.          Past Medical History:  Diagnosis Date  . Bell's palsy   . CHF (congestive heart failure) (HCC)   . Hypertension     ROS:   All systems reviewed and negative except as noted in the HPI.        Past Surgical History:  Procedure Laterality Date  . HERNIA REPAIR    . RIGHT/LEFT HEART CATH AND CORONARY ANGIOGRAPHY N/A 09/15/2017   Procedure: RIGHT/LEFT HEART CATH AND CORONARY ANGIOGRAPHY;  Surgeon: Laurey Morale, MD;  Location: [facility] INVASIVE CV LAB;  Service: Cardiovascular;  Laterality: N/A;  . ULTRASOUND GUIDANCE FOR VASCULAR ACCESS  09/15/2017   Procedure: Ultrasound Guidance For Vascular Access;  Surgeon: Laurey Morale, MD;  Location: [facility]  INVASIVE CV LAB;  Service: Cardiovascular;;          Family History  Problem Relation Age of Onset  . Dementia Mother   . CAD Father      Social History        Socioeconomic History  . Marital status: Married    Spouse name: Not on file  . Number of children: Not on file  . Years of education: Not on file  . Highest education level: Not on file  Social Needs  . Financial resource strain: Not on file  . Food insecurity - worry: Not on file  . Food insecurity - inability: Not on file  . Transportation needs - medical: Not on file  . Transportation needs - non-medical: Not on file  Occupational History  . Not on file  Tobacco Use  . Smoking status: Former Smoker    Types: Cigars    Last attempt to quit: 08/21/2017    Years since quitting: 0.4  . Smokeless tobacco: Never Used  . Tobacco comment: 1 cigar per day  Substance and Sexual Activity  . Alcohol use: Yes    Comment: 2 martinis per day  . Drug use: No  . Sexual activity: Not on file  Other Topics Concern  . Not on file  Social History Narrative  . Not on file  BP 102/82 (BP Location: Right Arm, Patient Position: Sitting, Cuff Size: Normal)   Pulse 70   Ht 6' (1.829 m)   Wt 232 lb (105.2 kg)   SpO2 99%   BMI 31.46 kg/m   Physical Exam:  Well appearing 62 yo man, NAD HEENT: Unremarkable Neck:  6 cm JVD, no thyromegally Lymphatics:  No adenopathy Back:  No CVA tenderness Lungs:  Clear with no wheezes HEART:  Regular rate rhythm, no murmurs, no rubs, no clicks Abd:  soft, positive bowel sounds, no organomegally, no rebound, no guarding Ext:  2 plus pulses, no edema, no cyanosis, no clubbing Skin:  No rashes no nodules Neuro:  CN II through XII intact, motor grossly intact  EKG - reviewed.   Assess/Plan: 1. Chronic systolic heart failure - his symptoms are class 2. He is on maximal medical therapy. I have discussed the treatment options with the patient. The  risks/benefits/goals/expectations of the procedure with the patient and he will call us if he wishes to proceed.  2. ICM - he denies anginal symptoms. He will continue his medical therapy.   Gerhard Munch.  EP Attending  Patient seen and examined. Agree with above. Since his prior clinic visit, no change in the history, exam, assessment and plan.  Leonia Reeves.D.

## 2018-07-31 NOTE — Discharge Instructions (Signed)
° ° °  Supplemental Discharge Instructions for  Pacemaker/Defibrillator Patients  Activity No heavy lifting or vigorous activity with your left/right arm for 6 to 8 weeks.  Do not raise your left/right arm above your head for one week.  Gradually raise your affected arm as drawn below.             08/04/18                     08/05/18                    08/06/18                   08/07/18 __  NO DRIVING for  1 week   ; you may begin driving on  8/41/32 .  WOUND CARE - Keep the wound area clean and dry.  Do not get this area wet for one week. No showers for one week; you may shower on  08/07/18   . - The tape/steri-strips on your wound will fall off; do not pull them off.  No bandage is needed on the site.  DO  NOT apply any creams, oils, or ointments to the wound area. - If you notice any drainage or discharge from the wound, any swelling or bruising at the site, or you develop a fever > 101? F after you are discharged home, call the office at once.  Special Instructions - You are still able to use cellular telephones; use the ear opposite the side where you have your pacemaker/defibrillator.  Avoid carrying your cellular phone near your device. - When traveling through airports, show security personnel your identification card to avoid being screened in the metal detectors.  Ask the security personnel to use the hand wand. - Avoid arc welding equipment, MRI testing (magnetic resonance imaging), TENS units (transcutaneous nerve stimulators).  Call the office for questions about other devices. - Avoid electrical appliances that are in poor condition or are not properly grounded. - Microwave ovens are safe to be near or to operate.  Additional information for defibrillator patients should your device go off: - If your device goes off ONCE and you feel fine afterward, notify the device clinic nurses. - If your device goes off ONCE and you do not feel well afterward, call 911. - If your device goes  off TWICE, call 911. - If your device goes off THREE times in one day, call 911.  DO NOT DRIVE YOURSELF OR A FAMILY MEMBER WITH A DEFIBRILLATOR TO THE HOSPITAL--CALL 911.

## 2018-07-31 NOTE — Discharge Summary (Addendum)
ELECTROPHYSIOLOGY PROCEDURE DISCHARGE SUMMARY    Patient ID: Shawn Webster,  MRN: [id number], DOB/AGE: Jan 21, 1956 62 y.o.  Admit date: 07/31/2018 Discharge date: 08/01/18  Primary Care Physician: Renford Dills, MD  Primary Cardiologist: Dr. Shirlee Latch Electrophysiologist: Dr. Ladona Ridgel  Primary Discharge Diagnosis:  1. ICM  Secondary Discharge Diagnosis:  1. CAD 2. Chronic CHF (systolic) 3. LV apical thrombus hx, on warfarin (monitored at coumadin clinic) 4. HTN 5. Hx of bell's Palsey  No Known Allergies   Procedures This Admission:  1.  Implantation of a BSCi dual chamber ICD on 07/31/18 by Dr Ladona Ridgel.  The patient received a [employer] (serial Number [id number]) ICD, Seward Carol (serial # [id number] ) right atrial lead and a [employer] (serial number [id number]) right ventricular defibrillator lead  DFT's were deferred at time of implant.  There were no immediate post procedure complications. 2.  CXR on 08/01/18 demonstrated no pneumothorax status post device implantation.   Brief HPI: Shawn Webster is a 62 y.o. male was referred to electrophysiology in the outpatient setting for consideration of ICD implantation.  Past medical history includes above.  The patient has persistent LV dysfunction despite guideline directed therapy.  Risks, benefits, and alternatives to ICD implantation were reviewed with the patient who wished to proceed.   Hospital Course:  The patient was admitted and underwent implantation of a ICD with details as outlined above.He was monitored on telemetry overnight which demonstrated SR.  Left chest was without hematoma or ecchymosis.  The device was interrogated and found to be functioning normally.  CXR was obtained and demonstrated no pneumothorax status post device implantation.  Wound care, arm mobility, and restrictions were reviewed with the patient.  The patient feels well this morning, no CP or SOB, he was examined by Dr.Naomie Crow and considered stable for  discharge to home.   The patient's discharge medications include an ARB (Entresto) and beta blocker (carvedilol).   Physical Exam: Vitals:   07/31/18 1948 07/31/18 2344 08/01/18 0356 08/01/18 0811  BP: 100/80 108/85 131/83 120/87  Pulse: 62 62 (!) 59 60  Resp: 20 20 20    Temp: 98.4 F (36.9 C) 98.4 F (36.9 C) 98.4 F (36.9 C) 98 F (36.7 C)  TempSrc: Oral Oral Oral Oral  SpO2: 98% 99% 99% 99%  Weight:   103.9 kg   Height:        GEN- The patient is well appearing, alert and oriented x 3 today.   HEENT: normocephalic, atraumatic; sclera clear, conjunctiva pink; hearing intact; oropharynx clear Lungs- CTA b/l, normal work of breathing.  No wheezes, rales, rhonchi Heart- RRR, no murmurs, rubs or gallops, PMI not laterally displaced GI- soft, non-tender, non-distended Extremities- no clubbing, cyanosis, or edema MS- no significant deformity or atrophy Skin- warm and dry, no rash or lesion, left chest without hematoma/ecchymosis Psych- euthymic mood, full affect Neuro- no gross defecits  Labs:   Lab Results  Component Value Date   WBC 5.1 07/25/2018   HGB 13.1 07/25/2018   HCT 38.4 07/25/2018   MCV 90 07/25/2018   PLT 249 07/25/2018    Recent Labs  Lab 07/25/18 1121  NA 140  K 4.6  CL 102  CO2 21  BUN 16  CREATININE 0.97  CALCIUM 9.2  GLUCOSE 118*    Discharge Medications:  Allergies as of 08/01/2018   No Known Allergies     Medication List    TAKE these medications   carvedilol 12.5 MG tablet Commonly known as:  COREG Take 1.5 tablets (18.75 mg total) by mouth 2 (two) times daily with a meal.   glimepiride 2 MG tablet Commonly known as:  AMARYL Take 2 mg by mouth daily with breakfast.   rosuvastatin 40 MG tablet Commonly known as:  CRESTOR Take 1 tablet (40 mg total) by mouth daily.   sacubitril-valsartan 97-103 MG Commonly known as:  ENTRESTO Take 1 tablet by mouth 2 (two) times daily.   spironolactone 25 MG tablet Commonly known as:   ALDACTONE Take 1 tablet (25 mg total) by mouth daily.   warfarin 10 MG tablet Commonly known as:  COUMADIN Take as directed. If you are unsure how to take this medication, talk to your nurse or doctor. Original instructions:  Take As Directed What changed:    how much to take  how to take this  when to take this  additional instructions Notes to patient:  Continue your current home regime unchanged as Dr. Ladona Ridgel discussed with you until your next coumadin clinic visit       Disposition:  Home  Discharge Instructions    Diet - low sodium heart healthy   Complete by:  As directed    Increase activity slowly   Complete by:  As directed      Follow-up Information    [facility] Office Follow up on 08/08/2018.   Specialty:  Cardiology Why:  2:45PM, coumadin clinic Contact information: [address] [place] [address] [phone]       [facility] [address] Follow up on 08/13/2018.   Specialty:  Cardiology Why:  11:00AM, wound check visit Contact information: [address], [address] [place] [address] [phone]       Laurey Morale, MD Follow up on 11/06/2018.   Specialty:  Cardiology Why:  9:20AM Contact information: [address] [place] [address] [phone]        Marinus Maw, MD Follow up on 11/06/2018.   Specialty:  Cardiology Why:  11:00AM Contact information: [address] [place] [address] [phone]           Duration of Discharge Encounter: Greater than 30 minutes including physician time.  Norma Fredrickson, PA-C 08/01/2018 9:09 AM   EP Attending  Patient seen and examined. Agree with the findings as noted above. The patient is s/p DDD ICD and is doing well. ICD interogation under my direct supervision demonstrates normal device function. CXR looks good. He will be discharged home with the usual followup.   Leonia Reeves.D.

## 2018-07-31 NOTE — Progress Notes (Signed)
Pt leaves cath lab holding is stable condition. Pt is alert and oriented. Lt chest dressing is clean, dry and intact. Sling is on Lt arm and pt instructed.

## 2018-07-31 NOTE — H&P (Signed)
ICD Criteria  Current LVEF:30%. Within 12 months prior to implant: Yes   Heart failure history: Yes, Class II  Cardiomyopathy history: Yes, Ischemic Cardiomyopathy - Prior MI.  Atrial Fibrillation/Atrial Flutter: No.  Ventricular tachycardia history: No.  Cardiac arrest history: No.  History of syndromes with risk of sudden death: No.  Previous ICD: No.  Current ICD indication: Primary  PPM indication: Yes. Pacing type: Atrial. Greater than 40% RV pacing requirement anticipated. Indication: Sick Sinus Syndrome   Class I or II Bradycardia indication present: Yes  Beta Blocker therapy for 3 or more months: Yes, prescribed.   Ace Inhibitor/ARB therapy for 3 or more months: Yes, prescribed.

## 2018-08-01 ENCOUNTER — Ambulatory Visit (HOSPITAL_COMMUNITY): Payer: 59

## 2018-08-01 DIAGNOSIS — I255 Ischemic cardiomyopathy: Secondary | ICD-10-CM | POA: Diagnosis not present

## 2018-08-01 LAB — GLUCOSE, CAPILLARY: Glucose-Capillary: 106 mg/dL — ABNORMAL HIGH (ref 70–99)

## 2018-08-01 MED FILL — Gentamicin Sulfate Inj 40 MG/ML: INTRAMUSCULAR | Qty: 80 | Status: AC

## 2018-08-01 MED FILL — Cefazolin Sodium-Dextrose IV Solution 2 GM/100ML-4%: INTRAVENOUS | Qty: 100 | Status: AC

## 2018-08-01 MED FILL — Lidocaine HCl Local Preservative Free (PF) Inj 1%: INTRAMUSCULAR | Qty: 30 | Status: AC

## 2018-08-01 NOTE — Progress Notes (Signed)
Discussed discharge instructions with patient including limitation of affected limb, follow up appointments and medications .  Sent home care instruction of ICD and discharge instructions with pt.  Also went of S&S of infections and when to call MD/911.  Pt did not have any further questions. Removed IV

## 2018-08-06 ENCOUNTER — Encounter (HOSPITAL_COMMUNITY): Payer: Self-pay

## 2018-08-06 NOTE — Progress Notes (Signed)
[employer] Forms completed and singed by Dr. Shirlee Latch. Patient made aware and requested to have forms at front desk for pick up. Placed at front desk of CHF clinic at [facility] in sealed envelope. Copy of forms scanned into patient's electronic medical record under media tab for reference.  Ave Filter, RN

## 2018-08-08 ENCOUNTER — Other Ambulatory Visit (HOSPITAL_COMMUNITY): Payer: Self-pay

## 2018-08-08 MED ORDER — ROSUVASTATIN CALCIUM 40 MG PO TABS: 40.0000 mg | ORAL_TABLET | Freq: Every day | ORAL | 5 refills | Status: DC

## 2018-08-13 ENCOUNTER — Ambulatory Visit (INDEPENDENT_AMBULATORY_CARE_PROVIDER_SITE_OTHER): Payer: 59 | Admitting: *Deleted

## 2018-08-13 ENCOUNTER — Ambulatory Visit (INDEPENDENT_AMBULATORY_CARE_PROVIDER_SITE_OTHER): Payer: 59

## 2018-08-13 DIAGNOSIS — Z5181 Encounter for therapeutic drug level monitoring: Secondary | ICD-10-CM

## 2018-08-13 DIAGNOSIS — I5022 Chronic systolic (congestive) heart failure: Secondary | ICD-10-CM | POA: Diagnosis not present

## 2018-08-13 DIAGNOSIS — I513 Intracardiac thrombosis, not elsewhere classified: Secondary | ICD-10-CM | POA: Diagnosis not present

## 2018-08-13 DIAGNOSIS — I240 Acute coronary thrombosis not resulting in myocardial infarction: Secondary | ICD-10-CM

## 2018-08-13 LAB — CUP PACEART INCLINIC DEVICE CHECK
Brady Statistic RA Percent Paced: 5 %
Brady Statistic RV Percent Paced: 1 % — CL
Date Time Interrogation Session: 20190923040000
HighPow Impedance: 72 Ohm
Implantable Lead Implant Date: 20190910
Implantable Lead Implant Date: 20190910
Implantable Lead Location: 753859
Implantable Lead Location: 753860
Implantable Lead Model: 293
Implantable Lead Model: 7741
Implantable Lead Serial Number: 1054233
Implantable Lead Serial Number: 440947
Implantable Pulse Generator Implant Date: 20190910
Lead Channel Impedance Value: 546 Ohm
Lead Channel Impedance Value: 637 Ohm
Lead Channel Pacing Threshold Amplitude: 0.4 V
Lead Channel Pacing Threshold Amplitude: 1 V
Lead Channel Pacing Threshold Pulse Width: 0.4 ms
Lead Channel Pacing Threshold Pulse Width: 0.4 ms
Lead Channel Sensing Intrinsic Amplitude: 15 mV
Lead Channel Sensing Intrinsic Amplitude: 7.4 mV
Lead Channel Setting Pacing Amplitude: 3.5 V
Lead Channel Setting Pacing Amplitude: 3.5 V
Lead Channel Setting Pacing Pulse Width: 0.4 ms
Lead Channel Setting Sensing Sensitivity: 0.5 mV
Pulse Gen Serial Number: 550897

## 2018-08-13 LAB — POCT INR: INR: 2.8 (ref 2.0–3.0)

## 2018-08-13 NOTE — Patient Instructions (Signed)
Description   Continue taking 1/2 tablet daily except 1 tablet on Mondays and Wednesdays. Continue eating 1 serving of dark green leafy veggies weekly.  Recheck in 4 weeks. Call with any new medications or concerns, [phone]     

## 2018-08-13 NOTE — Progress Notes (Signed)
Wound check appointment. Steri-strips removed. Wound without redness or edema. Incision edges approximated, wound well healed. Normal device function. Thresholds, sensing, and impedances consistent with implant measurements. Device programmed at 3.5V for extra safety margin until 3 month visit. Histogram distribution appropriate for patient and level of activity. No mode switches or ventricular arrhythmias noted. Patient educated about wound care, arm mobility, lifting restrictions, shock plan and Latitude monitor. ROV with Dr. Ladona Ridgel 11/06/18.

## 2018-08-21 ENCOUNTER — Telehealth (HOSPITAL_COMMUNITY): Payer: Self-pay | Admitting: *Deleted

## 2018-08-21 ENCOUNTER — Encounter (HOSPITAL_COMMUNITY): Payer: Self-pay | Admitting: *Deleted

## 2018-08-21 NOTE — Telephone Encounter (Signed)
Patient came in with letter from [facility], we had completed a claim form for patient recently and the claim was denied b/c MI was not listed as diagnosis.  Pt's wife is requesting we write a letter stating pt had an MI that led to his heart failure.  This is ok per Dr Shirlee Latch, letter written and signed by Dr Shirlee Latch, pt's wife is aware and will p/u letter and cath report

## 2018-09-04 ENCOUNTER — Other Ambulatory Visit (HOSPITAL_COMMUNITY): Payer: Self-pay

## 2018-09-04 MED ORDER — CARVEDILOL 12.5 MG PO TABS: 18.7500 mg | ORAL_TABLET | Freq: Two times a day (BID) | ORAL | 5 refills | Status: DC

## 2018-09-10 ENCOUNTER — Other Ambulatory Visit (HOSPITAL_COMMUNITY): Payer: Self-pay

## 2018-09-10 ENCOUNTER — Ambulatory Visit (INDEPENDENT_AMBULATORY_CARE_PROVIDER_SITE_OTHER): Payer: 59 | Admitting: *Deleted

## 2018-09-10 DIAGNOSIS — I513 Intracardiac thrombosis, not elsewhere classified: Secondary | ICD-10-CM

## 2018-09-10 DIAGNOSIS — Z5181 Encounter for therapeutic drug level monitoring: Secondary | ICD-10-CM

## 2018-09-10 DIAGNOSIS — I240 Acute coronary thrombosis not resulting in myocardial infarction: Secondary | ICD-10-CM

## 2018-09-10 LAB — POCT INR: INR: 3 (ref 2.0–3.0)

## 2018-09-10 MED ORDER — SACUBITRIL-VALSARTAN 97-103 MG PO TABS: 1.0000 | ORAL_TABLET | Freq: Two times a day (BID) | ORAL | 3 refills | Status: DC

## 2018-09-10 NOTE — Patient Instructions (Signed)
Description   Continue taking 1/2 tablet daily except 1 tablet on Mondays and Wednesdays. Continue eating 1 serving of dark green leafy veggies weekly.  Recheck in 4 weeks. Call with any new medications or concerns, [phone]     

## 2018-09-18 ENCOUNTER — Other Ambulatory Visit (HOSPITAL_COMMUNITY): Payer: Self-pay

## 2018-09-18 MED ORDER — SPIRONOLACTONE 25 MG PO TABS: 25.0000 mg | ORAL_TABLET | Freq: Every day | ORAL | 3 refills | Status: DC

## 2018-10-08 ENCOUNTER — Ambulatory Visit (INDEPENDENT_AMBULATORY_CARE_PROVIDER_SITE_OTHER): Payer: 59 | Admitting: *Deleted

## 2018-10-08 DIAGNOSIS — I513 Intracardiac thrombosis, not elsewhere classified: Secondary | ICD-10-CM | POA: Diagnosis not present

## 2018-10-08 DIAGNOSIS — Z5181 Encounter for therapeutic drug level monitoring: Secondary | ICD-10-CM

## 2018-10-08 DIAGNOSIS — I240 Acute coronary thrombosis not resulting in myocardial infarction: Secondary | ICD-10-CM

## 2018-10-08 LAB — POCT INR: INR: 4 — AB (ref 2.0–3.0)

## 2018-10-08 NOTE — Patient Instructions (Signed)
Description   Hold today's dose then continue taking 1/2 tablet daily except 1 tablet on Mondays and Wednesdays. Continue eating 1 serving of dark green leafy veggies weekly.  Recheck in 3 weeks. Call with any new medications or concerns, [phone]

## 2018-10-29 ENCOUNTER — Ambulatory Visit (INDEPENDENT_AMBULATORY_CARE_PROVIDER_SITE_OTHER): Payer: 59 | Admitting: *Deleted

## 2018-10-29 DIAGNOSIS — Z5181 Encounter for therapeutic drug level monitoring: Secondary | ICD-10-CM | POA: Diagnosis not present

## 2018-10-29 DIAGNOSIS — I513 Intracardiac thrombosis, not elsewhere classified: Secondary | ICD-10-CM | POA: Diagnosis not present

## 2018-10-29 DIAGNOSIS — I240 Acute coronary thrombosis not resulting in myocardial infarction: Secondary | ICD-10-CM

## 2018-10-29 LAB — POCT INR: INR: 2.6 (ref 2.0–3.0)

## 2018-10-29 NOTE — Patient Instructions (Signed)
Description   Continue taking 1/2 tablet daily except 1 tablet on Mondays and Wednesdays. Continue eating 1 serving of dark green leafy veggies weekly.  Recheck in 4 weeks. Call with any new medications or concerns, [phone]

## 2018-11-06 ENCOUNTER — Encounter (HOSPITAL_COMMUNITY): Payer: Self-pay | Admitting: Cardiology

## 2018-11-06 ENCOUNTER — Encounter: Payer: Self-pay | Admitting: Internal Medicine

## 2018-11-06 ENCOUNTER — Ambulatory Visit (INDEPENDENT_AMBULATORY_CARE_PROVIDER_SITE_OTHER): Payer: 59 | Admitting: Internal Medicine

## 2018-11-06 ENCOUNTER — Ambulatory Visit (HOSPITAL_COMMUNITY)
Admission: RE | Admit: 2018-11-06 | Discharge: 2018-11-06 | Disposition: A | Discharge status: Home / Self Care | Payer: 59 | Source: Ambulatory Visit | Attending: Cardiology | Admitting: Cardiology

## 2018-11-06 VITALS — BP 102/68 | HR 79 | Wt 233.8 lb

## 2018-11-06 VITALS — BP 102/64 | HR 64 | Ht 73.0 in | Wt 232.0 lb

## 2018-11-06 DIAGNOSIS — E785 Hyperlipidemia, unspecified: Secondary | ICD-10-CM | POA: Diagnosis not present

## 2018-11-06 DIAGNOSIS — Z95810 Presence of automatic (implantable) cardiac defibrillator: Secondary | ICD-10-CM

## 2018-11-06 DIAGNOSIS — I110 Hypertensive heart disease with heart failure: Secondary | ICD-10-CM | POA: Diagnosis not present

## 2018-11-06 DIAGNOSIS — E119 Type 2 diabetes mellitus without complications: Secondary | ICD-10-CM | POA: Diagnosis not present

## 2018-11-06 DIAGNOSIS — Z7901 Long term (current) use of anticoagulants: Secondary | ICD-10-CM | POA: Diagnosis not present

## 2018-11-06 DIAGNOSIS — Z79899 Other long term (current) drug therapy: Secondary | ICD-10-CM | POA: Diagnosis not present

## 2018-11-06 DIAGNOSIS — I2582 Chronic total occlusion of coronary artery: Secondary | ICD-10-CM | POA: Diagnosis not present

## 2018-11-06 DIAGNOSIS — M109 Gout, unspecified: Secondary | ICD-10-CM | POA: Diagnosis not present

## 2018-11-06 DIAGNOSIS — I255 Ischemic cardiomyopathy: Secondary | ICD-10-CM | POA: Diagnosis not present

## 2018-11-06 DIAGNOSIS — Z87891 Personal history of nicotine dependence: Secondary | ICD-10-CM | POA: Insufficient documentation

## 2018-11-06 DIAGNOSIS — I2510 Atherosclerotic heart disease of native coronary artery without angina pectoris: Secondary | ICD-10-CM | POA: Insufficient documentation

## 2018-11-06 DIAGNOSIS — I5022 Chronic systolic (congestive) heart failure: Secondary | ICD-10-CM | POA: Insufficient documentation

## 2018-11-06 DIAGNOSIS — Z7984 Long term (current) use of oral hypoglycemic drugs: Secondary | ICD-10-CM | POA: Insufficient documentation

## 2018-11-06 LAB — CUP PACEART INCLINIC DEVICE CHECK
Brady Statistic RA Percent Paced: 9 %
Brady Statistic RV Percent Paced: 0 %
Date Time Interrogation Session: 20191217050000
HighPow Impedance: 75 Ohm
Implantable Lead Implant Date: 20190910
Implantable Lead Implant Date: 20190910
Implantable Lead Location: 753859
Implantable Lead Location: 753860
Implantable Lead Model: 293
Implantable Lead Model: 7741
Implantable Lead Serial Number: 1054233
Implantable Lead Serial Number: 440947
Implantable Pulse Generator Implant Date: 20190910
Lead Channel Impedance Value: 652 Ohm
Lead Channel Impedance Value: 712 Ohm
Lead Channel Pacing Threshold Amplitude: 0.5 V
Lead Channel Pacing Threshold Amplitude: 0.9 V
Lead Channel Pacing Threshold Pulse Width: 0.4 ms
Lead Channel Pacing Threshold Pulse Width: 0.4 ms
Lead Channel Sensing Intrinsic Amplitude: 19 mV
Lead Channel Sensing Intrinsic Amplitude: 5.2 mV
Lead Channel Setting Pacing Amplitude: 2.5 V
Lead Channel Setting Pacing Amplitude: 2.5 V
Lead Channel Setting Pacing Pulse Width: 0.4 ms
Lead Channel Setting Sensing Sensitivity: 0.5 mV
Pulse Gen Serial Number: 550897

## 2018-11-06 LAB — BASIC METABOLIC PANEL
Anion gap: 8 (ref 5–15)
BUN: 20 mg/dL (ref 8–23)
CO2: 24 mmol/L (ref 22–32)
Calcium: 8.9 mg/dL (ref 8.9–10.3)
Chloride: 106 mmol/L (ref 98–111)
Creatinine, Ser: 1.16 mg/dL (ref 0.61–1.24)
GFR calc Af Amer: >60 mL/min (ref >60)
GFR calc non Af Amer: >60 mL/min (ref >60)
Glucose, Bld: 127 mg/dL — ABNORMAL HIGH (ref 70–99)
Potassium: 5.2 mmol/L — ABNORMAL HIGH (ref 3.5–5.1)
Sodium: 138 mmol/L (ref 135–145)

## 2018-11-06 MED ORDER — CARVEDILOL 25 MG PO TABS: 25.0000 mg | ORAL_TABLET | Freq: Two times a day (BID) | ORAL | 6 refills | Status: DC

## 2018-11-06 NOTE — Progress Notes (Signed)
Advanced Heart Failure Clinic Note  PCP: Dr Nehemiah Settle  Primary Cardiologist: Dr Shirlee Latch   HPI: Shawn Webster is a 62 y.o. male with a h/o chronic systolic heart failure (08/2017), LV thrombus on coumadin, severe CAD, and former smoker.   Admitted in 10/18 with increased dyspnea and fatigue. ECHO showed severely reduced EF ~20%.  LHC showed severe coronary disease: occluded LAD without collaterals and diffusely diseased LCx.  The LCx territory did not appear viable. Evaluated by Dr Sw[place]il[place] and Dr Laneta Simmers with recommendations for medical management, for the LAD there was no option for CTO revascularization or CABG. Started on HF meds.  [employer] ICD placed.    He presents today for followup of CHF and CAD.  He is doing well overall.  Weight down 3 lbs. Mild lightheadedness on occasion when he bends over and stands back up.  No significant exertional dyspnea.  No chest pain.  No BRBPR/melena.   [employer] device interrogation: HeartLogic score = 0  Labs (10/18): LDL 106 Labs (1/19): K 5.3 => 4.8, creatinine 0.82 Labs (3/19): K 4.7, creatinine 0.83 Labs (4/19): LDL 89 Labs (6/19): K 5.1, creatinine 1.05 Labs (7/19): LDL 50, HDL 51, LFTs normal Labs (4/09): K 4.6, creatinine 0.97, hgb 13.1  PMH: 1. Chronic systolic CHF: Ischemic cardiomyopathy.  - Echo (10/18): EF 20-25%, diffuse hypokinesis, mildly decreased RV systolic function.  - Cardiac MRI (10/18): Moderately dilated LV with mild LVH, EF 19% with wall motion abnormalities, LV thrombus, LGE pattern suggesting inferolateral wall would not be expected to improve with revascularization of the left circumflex. There did appear to be significant viability within the LAD territory with the exception of the apex. - RHC (10/18): mean RA 8, PA 54/23 mean 34, mean PCWP 22, CI 2.62, PVR 2 WU.  - Echo (2/19): EF 30%, wall motion abnormalities, mild LV dilation, chronic-appearing LV thrombus.  [employer] ICD 2. LV apical  thrombus.  3. CAD: LHC (10/18) with totally occluded LAD with minimal collateralization, diffuse severe disease in LCx. Films reviewed by Dr. Laneta Simmers and Dr [place], he was not thought to be candidate for CABG or CTO PCI.   4. Hyperlipidemia 5. HTN 6. H/o Bell's palsy.  7. H/o ETOH abuse 8. Gout   Review of systems complete and found to be negative unless listed in HPI.     Social History   Socioeconomic History  . Marital status: Married    Spouse name: Not on file  . Number of children: Not on file  . Years of education: Not on file  . Highest education level: Not on file  Occupational History  . Not on file  Social Needs  . Financial resource strain: Not on file  . Food insecurity:    Worry: Not on file    Inability: Not on file  . Transportation needs:    Medical: Not on file    Non-medical: Not on file  Tobacco Use  . Smoking status: Former Smoker    Types: Cigars    Last attempt to quit: 08/21/2017    Years since quitting: 1.2  . Smokeless tobacco: Never Used  . Tobacco comment: 1 cigar per day  Substance and Sexual Activity  . Alcohol use: Yes    Comment: 2 martinis per day  . Drug use: No  . Sexual activity: Not on file  Lifestyle  . Physical activity:    Days per week: Not on file    Minutes per session: Not on  file  . Stress: Not on file  Relationships  . Social connections:    Talks on phone: Not on file    Gets together: Not on file    Attends religious service: Not on file    Active member of club or organization: Not on file    Attends meetings of clubs or organizations: Not on file    Relationship status: Not on file  . Intimate partner violence:    Fear of current or ex partner: Not on file    Emotionally abused: Not on file    Physically abused: Not on file    Forced sexual activity: Not on file  Other Topics Concern  . Not on file  Social History Narrative  . Not on file    Family History  Problem Relation Age of Onset  . Dementia  Mother   . CAD Father     Past Medical History:  Diagnosis Date  . Bell's palsy   . CHF (congestive heart failure) (HCC)   . Hypertension     Current Outpatient Medications  Medication Sig Dispense Refill  . carvedilol (COREG) 25 MG tablet Take 1 tablet (25 mg total) by mouth 2 (two) times daily with a meal. 60 tablet 6  . glimepiride (AMARYL) 2 MG tablet Take 2 mg by mouth daily with breakfast.    . rosuvastatin (CRESTOR) 40 MG tablet Take 1 tablet (40 mg total) by mouth daily. 30 tablet 5  . sacubitril-valsartan (ENTRESTO) 97-103 MG Take 1 tablet by mouth 2 (two) times daily. 60 tablet 3  . spironolactone (ALDACTONE) 25 MG tablet Take 1 tablet (25 mg total) by mouth daily. 30 tablet 3  . warfarin (COUMADIN) 10 MG tablet Take As Directed (Patient taking differently: Take 5-10 mg by mouth See admin instructions. Take 5mg  by mouth on Monday and Wednesday. Take 10mg  on Tuesday, Thursday, Friday, Saturday and Sunday) 30 tablet 3   No current facility-administered medications for this encounter.     Vitals:   11/06/18 0924  BP: 102/68  Pulse: 79  SpO2: 98%  Weight: 106.1 kg (233 lb 12.8 oz)   Wt Readings from Last 3 Encounters:  11/06/18 105.2 kg (232 lb)  11/06/18 106.1 kg (233 lb 12.8 oz)  08/01/18 103.9 kg (229 lb)   PHYSICAL EXAM: General: NAD Neck: No JVD, no thyromegaly or thyroid nodule.  Lungs: Clear to auscultation bilaterally with normal respiratory effort. CV: Nondisplaced PMI.  Heart regular S1/S2, no S3/S4, no murmur.  No peripheral edema.  No carotid bruit.  Normal pedal pulses.  Abdomen: Soft, nontender, no hepatosplenomegaly, no distention.  Skin: Intact without lesions or rashes.  Neurologic: Alert and oriented x 3.  Psych: Normal affect. Extremities: No clubbing or cyanosis.  HEENT: Normal.   ASSESSMENT & PLAN: 1. Chronic Systolic Heart Failure: Echo 08/2017 ECHO 20-25%. Ischemic cardiomyopathy and ETOH may be both have played a role. Echo in 2/19 showed  that EF remained around 30%.  He has a Environmental manager ICD.  NYHA class II symptoms.  He is not volume overloaded on exam.  - Continue to use Lasix prn.  - Increase Coreg to 25 mg bid.    - Continue Entresto 97/103 bid. BMET today.  - Continue spironolactone 25 mg daily.  - QRS narrow on ECG so no role for CRT.   - Repeat echo with followup in 3 months.  2. CAD: Severe 2 vessel disease on 10/18 cath. Diffuse, severely diseased LCx system and chronic total occlusion  proximal LAD.  Cardiac MRI showed inferolateral aneurysm. Inferolateral wall did not appear viable so suspect intervention on LCx would not be helpful. There did appear to be viability in the anterior and anteroseptal walls, the true apex was nonviable.  I discussed with Dr. [place] and Dr. Laneta Simmers during his 10/18 admission: not candidate for CTO of LAD or CABG. No chest pain.  - Continue Crestor 40 mg daily, good lipids in 7/19.  - Given use of warfarin, not taking ASA.  3. LV Thrombus: Continue warfarin.  4. Smoking: He has quit.  5. ETOH: Minimal now.  6. Type II diabetes: In future, would recommend starting dapagliflozin or empagliflozin given positive effects in systolic CHF (would consider at next appt as I am already changing Coreg today).      Followup with me in 3 months.   Marca Ancona, MD  11/06/2018

## 2018-11-06 NOTE — Patient Instructions (Signed)
INCREASE Coreg to 25mg  (1 tab) daily. A new script was called into your pharmacy.  Labs today We will only contact you if something comes back abnormal or we need to make some changes. Otherwise no news is good news!  Your physician has requested that you have an echocardiogram. Echocardiography is a painless test that uses sound waves to create images of your heart. It provides your doctor with information about the size and shape of your heart and how well your heart's chambers and valves are working. This procedure takes approximately one hour. There are no restrictions for this procedure.  Your physician recommends that you schedule a follow-up appointment in: 3 months with an ECHO.   Do the following things EVERYDAY: 1) Weigh yourself in the morning before breakfast. Write it down and keep it in a log. 2) Take your medicines as prescribed 3) Eat low salt foods-Limit salt (sodium) to 2000 mg per day.  4) Stay as active as you can everyday 5) Limit all fluids for the day to less than 2 liters

## 2018-11-06 NOTE — Patient Instructions (Signed)
Medication Instructions:  Your physician recommends that you continue on your current medications as directed. Please refer to the Current Medication list given to you today.  Labwork: None ordered.  Testing/Procedures: None ordered.  Follow-Up: Your physician wants you to follow-up in: 9 months with Dr. Ladona Ridgel.   You will receive a reminder letter in the mail two months in advance. If you don't receive a letter, please call our office to schedule the follow-up appointment.  Remote monitoring is used to monitor your ICD from home. This monitoring reduces the number of office visits required to check your device to one time per year. It allows [place] to keep an eye on the functioning of your device to ensure it is working properly. You are scheduled for a device check from home on 02/05/2019. You may send your transmission at any time that day. If you have a wireless device, the transmission will be sent automatically. After your physician reviews your transmission, you will receive a postcard with your next transmission date.  Any Other Special Instructions Will Be Listed Below (If Applicable).  If you need a refill on your cardiac medications before your next appointment, please call your pharmacy.

## 2018-11-06 NOTE — Progress Notes (Signed)
HPI [name] is referred today for evaluation and consideration for ICD insertion. 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 He has severe LV dysfunction with an EF of 20% and an inferolateral wall aneurysm. He has severe 2 vessel CAD, but was not thought to be a candidate for revascularization. He has not had syncope. He has class 2 CHF symptoms. His QRS is narrow. He underwent prophylactive ICD insertion for primary prevention in September. In the interim, he has done well. 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  No Known Allergies   Current Outpatient Medications  Medication Sig Dispense Refill  . carvedilol (COREG) 25 MG tablet Take 1 tablet (25 mg total) by mouth 2 (two) times daily with a meal. 60 tablet 6  . glimepiride (AMARYL) 2 MG tablet Take 2 mg by mouth daily with breakfast.    . rosuvastatin (CRESTOR) 40 MG tablet Take 1 tablet (40 mg total) by mouth daily. 30 tablet 5  . sacubitril-valsartan (ENTRESTO) 97-103 MG Take 1 tablet by mouth 2 (two) times daily. 60 tablet 3  . spironolactone (ALDACTONE) 25 MG tablet Take 1 tablet (25 mg total) by mouth daily. 30 tablet 3  . warfarin (COUMADIN) 10 MG tablet Take As Directed (Patient taking differently: Take 5-10 mg by mouth See admin instructions. Take 5mg  by mouth on Monday and Wednesday. Take 10mg  on Tuesday, Thursday, Friday, Saturday and Sunday) 30 tablet 3   No current facility-administered medications for this visit.      Past Medical History:  Diagnosis Date  . Bell's palsy   . CHF (congestive heart failure) (HCC)   . Hypertension     ROS:   All systems reviewed and negative except as noted in the HPI.   Past Surgical History:  Procedure Laterality Date  . HERNIA REPAIR    . ICD IMPLANT N/A 07/31/2018   Procedure: ICD IMPLANT;  Surgeon: Marinus Maw, MD;  Location: [facility] INVASIVE CV LAB;  Service: Cardiovascular;  Laterality: N/A;  .  RIGHT/LEFT HEART CATH AND CORONARY ANGIOGRAPHY N/A 09/15/2017   Procedure: RIGHT/LEFT HEART CATH AND CORONARY ANGIOGRAPHY;  Surgeon: Laurey Morale, MD;  Location: [facility] INVASIVE CV LAB;  Service: Cardiovascular;  Laterality: N/A;  . ULTRASOUND GUIDANCE FOR VASCULAR ACCESS  09/15/2017   Procedure: Ultrasound Guidance For Vascular Access;  Surgeon: Laurey Morale, MD;  Location: [facility] INVASIVE CV LAB;  Service: Cardiovascular;;     Family History  Problem Relation Age of Onset  . Dementia Mother   . CAD Father      Social History   Socioeconomic History  . Marital status: Married    Spouse name: Not on file  . Number of children: Not on file  . Years of education: Not on file  . Highest education level: Not on file  Occupational History  . Not on file  Social Needs  . Financial resource strain: Not on file  . Food insecurity:    Worry: Not on file    Inability: Not on file  . Transportation needs:    Medical: Not on file    Non-medical: Not on file  Tobacco Use  . Smoking status: Former Smoker    Types: Cigars    Last attempt to quit: 08/21/2017    Years since quitting: 1.2  . Smokeless tobacco: Never Used  . Tobacco comment: 1 cigar per day  Substance  and Sexual Activity  . Alcohol use: Yes    Comment: 2 martinis per day  . Drug use: No  . Sexual activity: Not on file  Lifestyle  . Physical activity:    Days per week: Not on file    Minutes per session: Not on file  . Stress: Not on file  Relationships  . Social connections:    Talks on phone: Not on file    Gets together: Not on file    Attends religious service: Not on file    Active member of club or organization: Not on file    Attends meetings of clubs or organizations: Not on file    Relationship status: Not on file  . Intimate partner violence:    Fear of current or ex partner: Not on file    Emotionally abused: Not on file    Physically abused: Not on file    Forced sexual activity: Not on file    Other Topics Concern  . Not on file  Social History Narrative  . Not on file     BP 102/64   Pulse 64   Ht 6\' 1"  (1.854 m)   Wt 232 lb (105.2 kg)   SpO2 97%   BMI 30.61 kg/m   Physical Exam:  Well appearing NAD HEENT: Unremarkable Neck:  No JVD, no thyromegally Lymphatics:  No adenopathy Back:  No CVA tenderness Lungs:  Clear with no wheezes HEART:  Regular rate rhythm, no murmurs, no rubs, no clicks Abd:  soft, positive bowel sounds, no organomegally, no rebound, no guarding Ext:  2 plus pulses, no edema, no cyanosis, no clubbing Skin:  No rashes no nodules Neuro:  CN II through XII intact, motor grossly intact  DEVICE  Normal device function.  See PaceArt for details.   Assess/Plan: 1. Chronic systolic heart failure - his symptoms are class 2. He will continue his current meds. 2. ICD - - his boston sci device is working normally. 3. Dyslipidemia - his weight is up. I encouraged to lose weight and reduce his fat intake.He will continue  Crestor.  Leonia Reeves.D.

## 2018-11-26 ENCOUNTER — Ambulatory Visit (INDEPENDENT_AMBULATORY_CARE_PROVIDER_SITE_OTHER): Payer: BLUE CROSS/BLUE SHIELD | Admitting: *Deleted

## 2018-11-26 DIAGNOSIS — I513 Intracardiac thrombosis, not elsewhere classified: Secondary | ICD-10-CM | POA: Diagnosis not present

## 2018-11-26 DIAGNOSIS — I240 Acute coronary thrombosis not resulting in myocardial infarction: Secondary | ICD-10-CM

## 2018-11-26 DIAGNOSIS — Z5181 Encounter for therapeutic drug level monitoring: Secondary | ICD-10-CM | POA: Diagnosis not present

## 2018-11-26 LAB — POCT INR: INR: 3.1 — AB (ref 2.0–3.0)

## 2018-11-26 NOTE — Patient Instructions (Signed)
Description   Today take 1/2 tablet then continue taking 1/2 tablet daily except 1 tablet on Mondays and Wednesdays. Continue eating 1 serving of dark green leafy veggies weekly.  Recheck in 4 weeks. Call with any new medications or concerns, [phone]

## 2018-12-05 ENCOUNTER — Other Ambulatory Visit: Payer: Self-pay | Admitting: Internal Medicine

## 2018-12-24 ENCOUNTER — Ambulatory Visit (INDEPENDENT_AMBULATORY_CARE_PROVIDER_SITE_OTHER): Payer: BLUE CROSS/BLUE SHIELD | Admitting: Pharmacist

## 2018-12-24 DIAGNOSIS — Z5181 Encounter for therapeutic drug level monitoring: Secondary | ICD-10-CM | POA: Diagnosis not present

## 2018-12-24 DIAGNOSIS — I240 Acute coronary thrombosis not resulting in myocardial infarction: Secondary | ICD-10-CM

## 2018-12-24 DIAGNOSIS — I513 Intracardiac thrombosis, not elsewhere classified: Secondary | ICD-10-CM | POA: Diagnosis not present

## 2018-12-24 LAB — POCT INR: INR: 2.2 (ref 2.0–3.0)

## 2018-12-24 NOTE — Patient Instructions (Signed)
Description   Continue taking 1/2 tablet daily except 1 tablet on Mondays and Wednesdays. Continue eating about 1 serving of dark green leafy veggies weekly.  Recheck in 4 weeks. Call with any new medications or concerns, [phone]

## 2019-01-12 ENCOUNTER — Other Ambulatory Visit (HOSPITAL_COMMUNITY): Payer: Self-pay | Admitting: Cardiology

## 2019-01-13 ENCOUNTER — Other Ambulatory Visit (HOSPITAL_COMMUNITY): Payer: Self-pay | Admitting: Cardiology

## 2019-01-21 ENCOUNTER — Ambulatory Visit (INDEPENDENT_AMBULATORY_CARE_PROVIDER_SITE_OTHER): Payer: BLUE CROSS/BLUE SHIELD

## 2019-01-21 DIAGNOSIS — I513 Intracardiac thrombosis, not elsewhere classified: Secondary | ICD-10-CM

## 2019-01-21 DIAGNOSIS — Z5181 Encounter for therapeutic drug level monitoring: Secondary | ICD-10-CM

## 2019-01-21 DIAGNOSIS — I240 Acute coronary thrombosis not resulting in myocardial infarction: Secondary | ICD-10-CM

## 2019-01-21 LAB — POCT INR: INR: 3.4 — AB (ref 2.0–3.0)

## 2019-01-21 NOTE — Patient Instructions (Signed)
Description   Skip today's dosage of Coumadin, then resume same dosage 1/2 tablet daily except 1 tablet on Mondays and Wednesdays. Continue eating about 1 serving of dark green leafy veggies weekly.  Recheck in 3 weeks. Call with any new medications or concerns, [phone]

## 2019-01-25 ENCOUNTER — Other Ambulatory Visit (HOSPITAL_COMMUNITY): Payer: Self-pay | Admitting: Cardiology

## 2019-01-29 ENCOUNTER — Other Ambulatory Visit: Payer: Self-pay | Admitting: Cardiology

## 2019-02-05 ENCOUNTER — Other Ambulatory Visit: Payer: Self-pay

## 2019-02-05 ENCOUNTER — Encounter (HOSPITAL_COMMUNITY): Payer: Self-pay | Admitting: Cardiology

## 2019-02-05 ENCOUNTER — Ambulatory Visit (HOSPITAL_BASED_OUTPATIENT_CLINIC_OR_DEPARTMENT_OTHER)
Admission: RE | Admit: 2019-02-05 | Discharge: 2019-02-05 | Disposition: A | Discharge status: Home / Self Care | Payer: BLUE CROSS/BLUE SHIELD | Source: Ambulatory Visit | Attending: Cardiology | Admitting: Cardiology

## 2019-02-05 ENCOUNTER — Ambulatory Visit (INDEPENDENT_AMBULATORY_CARE_PROVIDER_SITE_OTHER): Payer: BLUE CROSS/BLUE SHIELD | Admitting: *Deleted

## 2019-02-05 ENCOUNTER — Ambulatory Visit (HOSPITAL_COMMUNITY)
Admission: RE | Admit: 2019-02-05 | Discharge: 2019-02-05 | Disposition: A | Discharge status: Home / Self Care | Payer: BLUE CROSS/BLUE SHIELD | Source: Ambulatory Visit | Attending: Cardiology | Admitting: Cardiology

## 2019-02-05 VITALS — BP 110/70 | HR 66 | Wt 229.0 lb

## 2019-02-05 DIAGNOSIS — Z7984 Long term (current) use of oral hypoglycemic drugs: Secondary | ICD-10-CM | POA: Diagnosis not present

## 2019-02-05 DIAGNOSIS — M109 Gout, unspecified: Secondary | ICD-10-CM | POA: Insufficient documentation

## 2019-02-05 DIAGNOSIS — I2582 Chronic total occlusion of coronary artery: Secondary | ICD-10-CM | POA: Insufficient documentation

## 2019-02-05 DIAGNOSIS — Z87891 Personal history of nicotine dependence: Secondary | ICD-10-CM | POA: Insufficient documentation

## 2019-02-05 DIAGNOSIS — I2510 Atherosclerotic heart disease of native coronary artery without angina pectoris: Secondary | ICD-10-CM | POA: Diagnosis not present

## 2019-02-05 DIAGNOSIS — I5022 Chronic systolic (congestive) heart failure: Secondary | ICD-10-CM | POA: Insufficient documentation

## 2019-02-05 DIAGNOSIS — Z7901 Long term (current) use of anticoagulants: Secondary | ICD-10-CM | POA: Diagnosis not present

## 2019-02-05 DIAGNOSIS — I255 Ischemic cardiomyopathy: Secondary | ICD-10-CM | POA: Diagnosis not present

## 2019-02-05 DIAGNOSIS — I110 Hypertensive heart disease with heart failure: Secondary | ICD-10-CM | POA: Diagnosis not present

## 2019-02-05 DIAGNOSIS — Z79899 Other long term (current) drug therapy: Secondary | ICD-10-CM | POA: Insufficient documentation

## 2019-02-05 DIAGNOSIS — Z8249 Family history of ischemic heart disease and other diseases of the circulatory system: Secondary | ICD-10-CM | POA: Diagnosis not present

## 2019-02-05 DIAGNOSIS — E119 Type 2 diabetes mellitus without complications: Secondary | ICD-10-CM | POA: Diagnosis not present

## 2019-02-05 DIAGNOSIS — E785 Hyperlipidemia, unspecified: Secondary | ICD-10-CM | POA: Diagnosis not present

## 2019-02-05 DIAGNOSIS — Z95810 Presence of automatic (implantable) cardiac defibrillator: Secondary | ICD-10-CM | POA: Diagnosis not present

## 2019-02-05 LAB — BASIC METABOLIC PANEL
Anion gap: 4 — ABNORMAL LOW (ref 5–15)
BUN: 14 mg/dL (ref 8–23)
CO2: 25 mmol/L (ref 22–32)
Calcium: 9 mg/dL (ref 8.9–10.3)
Chloride: 110 mmol/L (ref 98–111)
Creatinine, Ser: 1 mg/dL (ref 0.61–1.24)
GFR calc Af Amer: >60 mL/min (ref >60)
GFR calc non Af Amer: >60 mL/min (ref >60)
Glucose, Bld: 83 mg/dL (ref 70–99)
Potassium: 4.9 mmol/L (ref 3.5–5.1)
Sodium: 139 mmol/L (ref 135–145)

## 2019-02-05 MED ORDER — DAPAGLIFLOZIN PROPANEDIOL 10 MG PO TABS: 10.0000 mg | ORAL_TABLET | Freq: Every day | ORAL | 6 refills | Status: DC

## 2019-02-05 NOTE — Progress Notes (Signed)
  Echocardiogram 2D Echocardiogram has been performed.  Shawn Webster 02/05/2019, 9:29 AM

## 2019-02-05 NOTE — Progress Notes (Signed)
Advanced Heart Failure Clinic Note  PCP: Dr Nehemiah Settle  Primary Cardiologist: Dr Shirlee Latch   HPI: Shawn Webster is a 63 y.o. male with a h/o chronic systolic heart failure (08/2017), LV thrombus on coumadin, severe CAD, and former smoker.   Admitted in 10/18 with increased dyspnea and fatigue. ECHO showed severely reduced EF ~20%.  LHC showed severe coronary disease: occluded LAD without collaterals and diffusely diseased LCx.  The LCx territory did not appear viable. Evaluated by Dr [place] and Dr Laneta Simmers with recommendations for medical management, for the LAD there was no option for CTO revascularization or CABG. Started on HF meds.  [employer] ICD placed.    Echo was done today and reviewed, EF 25-30% with moderate LV dilation.   He presents today for followup of CHF and CAD.  Overall doing well.  No significant exertional dyspnea.  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  No orthopnea/PND. Weight down 4 lbs.   Labs (10/18): LDL 106 Labs (1/19): K 5.3 => 4.8, creatinine 0.82 Labs (3/19): K 4.7, creatinine 0.83 Labs (4/19): LDL 89 Labs (6/19): K 5.1, creatinine 1.05 Labs (7/19): LDL 50, HDL 51, LFTs normal Labs (4/19): K 4.6, creatinine 0.97, hgb 13.1 Labs (12/19): K 5.2, creatinine 1.16  PMH: 1. Chronic systolic CHF: Ischemic cardiomyopathy.  - Echo (10/18): EF 20-25%, diffuse hypokinesis, mildly decreased RV systolic function.  - Cardiac MRI (10/18): Moderately dilated LV with mild LVH, EF 19% with wall motion abnormalities, LV thrombus, LGE pattern suggesting inferolateral wall would not be expected to improve with revascularization of the left circumflex. There did appear to be significant viability within the LAD territory with the exception of the apex. - RHC (10/18): mean RA 8, PA 54/23 mean 34, mean PCWP 22, CI 2.62, PVR 2 WU.  - Echo (2/19): EF 30%, wall motion abnormalities, mild LV dilation, chronic-appearing LV thrombus.  - [employer] ICD -  Echo (3/20): LV moderately dilated with EF 25-30%, RV normal size/systolic function.  2. LV apical thrombus.  3. CAD: LHC (10/18) with totally occluded LAD with minimal collateralization, diffuse severe disease in LCx. Films reviewed by Dr. Laneta Simmers and Dr [place], he was not thought to be candidate for CABG or CTO PCI.   4. Hyperlipidemia 5. HTN 6. H/o Bell's palsy.  7. H/o ETOH abuse 8. Gout   Review of systems complete and found to be negative unless listed in HPI.     Social History   Socioeconomic History  . Marital status: Married    Spouse name: Not on file  . Number of children: Not on file  . Years of education: Not on file  . Highest education level: Not on file  Occupational History  . Not on file  Social Needs  . Financial resource strain: Not on file  . Food insecurity:    Worry: Not on file    Inability: Not on file  . Transportation needs:    Medical: Not on file    Non-medical: Not on file  Tobacco Use  . Smoking status: Former Smoker    Types: Cigars    Last attempt to quit: 08/21/2017    Years since quitting: 1.4  . Smokeless tobacco: Never Used  . Tobacco comment: 1 cigar Shawn day  Substance and Sexual Activity  . Alcohol use: Yes    Comment: 2 martinis Shawn day  . Drug use: No  . Sexual activity: Not on file  Lifestyle  . Physical activity:  Days Shawn week: Not on file    Minutes Shawn session: Not on file  . Stress: Not on file  Relationships  . Social connections:    Talks on phone: Not on file    Gets together: Not on file    Attends religious service: Not on file    Active member of club or organization: Not on file    Attends meetings of clubs or organizations: Not on file    Relationship status: Not on file  . Intimate partner violence:    Fear of current or ex partner: Not on file    Emotionally abused: Not on file    Physically abused: Not on file    Forced sexual activity: Not on file  Other Topics Concern  . Not on file  Social  History Narrative  . Not on file    Family History  Problem Relation Age of Onset  . Dementia Mother   . CAD Father     Past Medical History:  Diagnosis Date  . Bell's palsy   . CHF (congestive heart failure) (HCC)   . Hypertension     Current Outpatient Medications  Medication Sig Dispense Refill  . carvedilol (COREG) 25 MG tablet Take 1 tablet (25 mg total) by mouth 2 (two) times daily with a meal. 60 tablet 6  . ENTRESTO 97-103 MG TAKE 1 TABLET BY MOUTH TWICE DAILY 60 tablet 6  . glimepiride (AMARYL) 2 MG tablet Take 2 mg by mouth daily with breakfast.    . rosuvastatin (CRESTOR) 40 MG tablet TAKE 1 TABLET BY MOUTH DAILY. DISCONTINU ATORVASTATIN 30 tablet 2  . spironolactone (ALDACTONE) 25 MG tablet TAKE 1 TABLET(25 MG) BY MOUTH DAILY 30 tablet 6  . warfarin (COUMADIN) 10 MG tablet Take 0.5-1 tablets (5-10 mg total) by mouth See admin instructions. Take 5mg  by mouth on Monday and Wednesday. Take 10mg  on Tuesday, Thursday, Friday, Saturday and Sunday 30 tablet 3  . dapagliflozin propanediol (FARXIGA) 10 MG TABS tablet Take 10 mg by mouth daily. 30 tablet 6   No current facility-administered medications for this encounter.     Vitals:   02/05/19 1002  BP: 110/70  Pulse: 66  SpO2: 97%  Weight: 103.9 kg (229 lb)   Wt Readings from Last 3 Encounters:  02/05/19 103.9 kg (229 lb)  11/06/18 106.1 kg (233 lb 12.8 oz)  11/06/18 105.2 kg (232 lb)   PHYSICAL EXAM: General: NAD Neck: No JVD, no thyromegaly or thyroid nodule.  Lungs: Clear to auscultation bilaterally with normal respiratory effort. CV: Nondisplaced PMI.  Heart regular S1/S2, no S3/S4, no murmur.  No peripheral edema.  No carotid bruit.  Normal pedal pulses.  Abdomen: Soft, nontender, no hepatosplenomegaly, no distention.  Skin: Intact without lesions or rashes.  Neurologic: Alert and oriented x 3.  Psych: Normal affect. Extremities: No clubbing or cyanosis.  HEENT: Normal.   ASSESSMENT & PLAN: 1. Chronic  Systolic Heart Failure: Echo 08/2017 ECHO 20-25%. Ischemic cardiomyopathy and ETOH may be both have played a role. Echo was done today and reviewed, EF 25-30%.  He has a Environmental [employer] ICD.  NYHA class II symptoms.  He is not volume overloaded on exam.  - Continue to use Lasix prn.  - Continue Coreg 25 mg bid.    - Continue Entresto 97/103 bid. BMET today.  - Continue spironolactone 25 mg daily.  - QRS narrow on ECG so no role for CRT.   - Given co-existing diabetes, I will start him  on dapagliflozin 10 mg daily with BMET in 10 days.  - I will arrange for CPX 2. CAD: Severe 2 vessel disease on 10/18 cath. Diffuse, severely diseased LCx system and chronic total occlusion proximal LAD.  Cardiac MRI showed inferolateral aneurysm. Inferolateral wall did not appear viable so suspect intervention on LCx would not be helpful. There did appear to be viability in the anterior and anteroseptal walls, the true apex was nonviable.  I discussed with Dr. [place] and Dr. Laneta Simmers during his 10/18 admission: not candidate for CTO of LAD or CABG. No chest pain.  - Continue Crestor 40 mg daily, good lipids in 7/19.  - Given use of warfarin, not taking ASA.  3. LV Thrombus: Continue warfarin.  4. Smoking: He has quit.  5. ETOH: Minimal now.  6. Type II diabetes: As above, starting dapagliflozin.  He may be able to stop glimepiride in the future.   Followup with me in 4 months.   Marca Ancona, MD  02/05/2019

## 2019-02-05 NOTE — Patient Instructions (Signed)
Start Farxiga 10 mg daily  Labs done today  Labs in 2 weeks  Your physician has recommended that you have a cardiopulmonary stress test (CPX). CPX testing is a non-invasive measurement of heart and lung function. It replaces a traditional treadmill stress test. This type of test provides a tremendous amount of information that relates not only to your present condition but also for future outcomes. This test combines measurements of you ventilation, respiratory gas exchange in the lungs, electrocardiogram (EKG), blood pressure and physical response before, during, and following an exercise protocol.  In 1 month  Your physician recommends that you schedule a follow-up appointment in: 4 months

## 2019-02-08 ENCOUNTER — Telehealth: Payer: Self-pay

## 2019-02-08 NOTE — Telephone Encounter (Signed)
LMOM FOR PRESCREEN  

## 2019-02-10 LAB — CUP PACEART REMOTE DEVICE CHECK
Battery Remaining Longevity: 156 mo
Battery Remaining Percentage: 100 %
Brady Statistic RA Percent Paced: 0 %
Brady Statistic RV Percent Paced: 4 %
Date Time Interrogation Session: 20200309064000
HighPow Impedance: 64 Ohm
Implantable Lead Implant Date: 20190910
Implantable Lead Implant Date: 20190910
Implantable Lead Location: 753859
Implantable Lead Location: 753860
Implantable Lead Model: 293
Implantable Lead Model: 7741
Implantable Lead Serial Number: 1054233
Implantable Lead Serial Number: 440947
Implantable Pulse Generator Implant Date: 20190910
Lead Channel Impedance Value: 691 Ohm
Lead Channel Impedance Value: 806 Ohm
Lead Channel Setting Pacing Amplitude: 2.5 V
Lead Channel Setting Pacing Amplitude: 2.5 V
Lead Channel Setting Pacing Pulse Width: 0.4 ms
Lead Channel Setting Sensing Sensitivity: 0.5 mV
Pulse Gen Serial Number: 550897

## 2019-02-11 ENCOUNTER — Other Ambulatory Visit: Payer: Self-pay

## 2019-02-11 ENCOUNTER — Ambulatory Visit (INDEPENDENT_AMBULATORY_CARE_PROVIDER_SITE_OTHER): Payer: BLUE CROSS/BLUE SHIELD | Admitting: *Deleted

## 2019-02-11 DIAGNOSIS — Z5181 Encounter for therapeutic drug level monitoring: Secondary | ICD-10-CM | POA: Diagnosis not present

## 2019-02-11 DIAGNOSIS — I513 Intracardiac thrombosis, not elsewhere classified: Secondary | ICD-10-CM

## 2019-02-11 DIAGNOSIS — I240 Acute coronary thrombosis not resulting in myocardial infarction: Secondary | ICD-10-CM

## 2019-02-11 LAB — POCT INR: INR: 2.4 (ref 2.0–3.0)

## 2019-02-11 NOTE — Patient Instructions (Signed)
Description   Continue taking 1/2 tablet daily except 1 tablet on Mondays and Wednesdays. Continue eating about 1 serving of dark green leafy veggies weekly.  Recheck in 4 weeks. Call with any new medications or concerns, [phone]

## 2019-02-13 ENCOUNTER — Encounter: Payer: Self-pay | Admitting: Cardiology

## 2019-02-13 NOTE — Progress Notes (Signed)
Remote ICD transmission.   

## 2019-02-19 ENCOUNTER — Ambulatory Visit (HOSPITAL_COMMUNITY)
Admission: RE | Admit: 2019-02-19 | Discharge: 2019-02-19 | Disposition: A | Discharge status: Home / Self Care | Payer: BLUE CROSS/BLUE SHIELD | Source: Ambulatory Visit | Attending: Cardiology | Admitting: Cardiology

## 2019-02-19 ENCOUNTER — Other Ambulatory Visit: Payer: Self-pay

## 2019-02-19 DIAGNOSIS — I5022 Chronic systolic (congestive) heart failure: Secondary | ICD-10-CM | POA: Insufficient documentation

## 2019-02-19 LAB — BASIC METABOLIC PANEL
Anion gap: 7 (ref 5–15)
BUN: 19 mg/dL (ref 8–23)
CO2: 22 mmol/L (ref 22–32)
Calcium: 8.8 mg/dL — ABNORMAL LOW (ref 8.9–10.3)
Chloride: 108 mmol/L (ref 98–111)
Creatinine, Ser: 1.05 mg/dL (ref 0.61–1.24)
GFR calc Af Amer: >60 mL/min (ref >60)
GFR calc non Af Amer: >60 mL/min (ref >60)
Glucose, Bld: 78 mg/dL (ref 70–99)
Potassium: 4.7 mmol/L (ref 3.5–5.1)
Sodium: 137 mmol/L (ref 135–145)

## 2019-03-07 ENCOUNTER — Telehealth: Payer: Self-pay | Admitting: Internal Medicine

## 2019-03-07 NOTE — Telephone Encounter (Signed)
appt concerns not related to CPX CHF clinic will defer to [facility] church to address patients concerns.

## 2019-03-07 NOTE — Telephone Encounter (Signed)
New message:    Patient states that he had received a message threw my chart stating that his appt was cancel. Please call patient.

## 2019-03-08 ENCOUNTER — Telehealth: Payer: Self-pay

## 2019-03-08 ENCOUNTER — Telehealth: Payer: Self-pay | Admitting: *Deleted

## 2019-03-08 NOTE — Telephone Encounter (Signed)
1. Do you currently have a fever? No yes = cancel and refer to pcp for e-visit) 2. Have you recently travelled on a cruise, internationally, or to [place], [place], [place], [place], [place], or [place], [place] ([employer]) ? no (yes = cancel, stay home, monitor symptoms, and contact pcp or initiate e-visit if symptoms develop) 3. Have you been in contact with someone that is currently pending confirmation of Covid19 testing or has been confirmed to have the Covid19 virus?  no (yes = cancel, stay home, away from tested individual, monitor symptoms, and contact pcp or initiate e-visit if symptoms develop) 4. Are you currently experiencing fatigue or cough? no (yes = pt should be prepared to have a mask placed at the time of their visit).  Pt. Advised that we are restricting visitors at this time and anyone present in the vehicle should meet the above criteria as well. Advised that visit will be at curbside for finger stick ONLY and will receive call with instructions. Pt also advised to please bring own pen for signature of arrival document.   

## 2019-03-08 NOTE — Telephone Encounter (Signed)
lmom for prescreen  

## 2019-03-11 ENCOUNTER — Other Ambulatory Visit: Payer: Self-pay

## 2019-03-11 ENCOUNTER — Ambulatory Visit (INDEPENDENT_AMBULATORY_CARE_PROVIDER_SITE_OTHER): Payer: BLUE CROSS/BLUE SHIELD | Admitting: *Deleted

## 2019-03-11 DIAGNOSIS — I513 Intracardiac thrombosis, not elsewhere classified: Secondary | ICD-10-CM | POA: Diagnosis not present

## 2019-03-11 DIAGNOSIS — Z5181 Encounter for therapeutic drug level monitoring: Secondary | ICD-10-CM

## 2019-03-11 DIAGNOSIS — I240 Acute coronary thrombosis not resulting in myocardial infarction: Secondary | ICD-10-CM

## 2019-03-11 LAB — POCT INR: INR: 3.1 — AB (ref 2.0–3.0)

## 2019-03-13 ENCOUNTER — Encounter (HOSPITAL_COMMUNITY): Payer: BLUE CROSS/BLUE SHIELD

## 2019-04-05 ENCOUNTER — Telehealth: Payer: Self-pay

## 2019-04-05 NOTE — Telephone Encounter (Signed)
LMOM FOR PRESCREEN  

## 2019-04-08 ENCOUNTER — Ambulatory Visit (INDEPENDENT_AMBULATORY_CARE_PROVIDER_SITE_OTHER): Payer: BLUE CROSS/BLUE SHIELD | Admitting: Pharmacist Clinician (PhC)/ Clinical Pharmacy Specialist

## 2019-04-08 ENCOUNTER — Other Ambulatory Visit: Payer: Self-pay | Admitting: Pharmacist Clinician (PhC)/ Clinical Pharmacy Specialist

## 2019-04-08 ENCOUNTER — Other Ambulatory Visit: Payer: Self-pay

## 2019-04-08 ENCOUNTER — Telehealth: Payer: Self-pay | Admitting: *Deleted

## 2019-04-08 DIAGNOSIS — I513 Intracardiac thrombosis, not elsewhere classified: Secondary | ICD-10-CM | POA: Diagnosis not present

## 2019-04-08 DIAGNOSIS — Z5181 Encounter for therapeutic drug level monitoring: Secondary | ICD-10-CM | POA: Diagnosis not present

## 2019-04-08 DIAGNOSIS — I240 Acute coronary thrombosis not resulting in myocardial infarction: Secondary | ICD-10-CM

## 2019-04-08 LAB — POCT INR: INR: 3.1 — AB (ref 2.0–3.0)

## 2019-04-08 NOTE — Telephone Encounter (Signed)
error 

## 2019-04-08 NOTE — Telephone Encounter (Signed)

## 2019-04-24 ENCOUNTER — Encounter (HOSPITAL_COMMUNITY): Payer: BLUE CROSS/BLUE SHIELD

## 2019-04-29 ENCOUNTER — Other Ambulatory Visit (HOSPITAL_COMMUNITY): Payer: Self-pay | Admitting: Cardiology

## 2019-05-02 ENCOUNTER — Telehealth: Payer: Self-pay

## 2019-05-02 NOTE — Telephone Encounter (Signed)

## 2019-05-07 ENCOUNTER — Ambulatory Visit (INDEPENDENT_AMBULATORY_CARE_PROVIDER_SITE_OTHER): Payer: BC Managed Care – PPO | Admitting: *Deleted

## 2019-05-07 DIAGNOSIS — Z95810 Presence of automatic (implantable) cardiac defibrillator: Secondary | ICD-10-CM

## 2019-05-07 DIAGNOSIS — I5022 Chronic systolic (congestive) heart failure: Secondary | ICD-10-CM | POA: Diagnosis not present

## 2019-05-07 LAB — CUP PACEART REMOTE DEVICE CHECK
Battery Remaining Longevity: 156 mo
Battery Remaining Percentage: 100 %
Brady Statistic RA Percent Paced: 1 %
Brady Statistic RV Percent Paced: 2 %
Date Time Interrogation Session: 20200616064100
HighPow Impedance: 77 Ohm
Implantable Lead Implant Date: 20190910
Implantable Lead Implant Date: 20190910
Implantable Lead Location: 753859
Implantable Lead Location: 753860
Implantable Lead Model: 293
Implantable Lead Model: 7741
Implantable Lead Serial Number: 1054233
Implantable Lead Serial Number: 440947
Implantable Pulse Generator Implant Date: 20190910
Lead Channel Impedance Value: 667 Ohm
Lead Channel Impedance Value: 784 Ohm
Lead Channel Setting Pacing Amplitude: 2.5 V
Lead Channel Setting Pacing Amplitude: 2.5 V
Lead Channel Setting Pacing Pulse Width: 0.4 ms
Lead Channel Setting Sensing Sensitivity: 0.5 mV
Pulse Gen Serial Number: 550897

## 2019-05-08 ENCOUNTER — Other Ambulatory Visit: Payer: Self-pay

## 2019-05-08 ENCOUNTER — Ambulatory Visit (INDEPENDENT_AMBULATORY_CARE_PROVIDER_SITE_OTHER): Payer: BC Managed Care – PPO | Admitting: *Deleted

## 2019-05-08 DIAGNOSIS — Z5181 Encounter for therapeutic drug level monitoring: Secondary | ICD-10-CM

## 2019-05-08 DIAGNOSIS — I513 Intracardiac thrombosis, not elsewhere classified: Secondary | ICD-10-CM

## 2019-05-08 DIAGNOSIS — I240 Acute coronary thrombosis not resulting in myocardial infarction: Secondary | ICD-10-CM

## 2019-05-08 LAB — POCT INR: INR: 3.8 — AB (ref 2.0–3.0)

## 2019-05-08 NOTE — Patient Instructions (Signed)
Description    Hold today and then decrease dose to 1/2 tablet daily except 1 tablet on Mondays. Continue eating about 1 serving of dark green leafy veggies weekly.  Recheck in 2-3 weeks. Call with any new medications or concerns, [phone]

## 2019-05-14 ENCOUNTER — Other Ambulatory Visit: Payer: Self-pay | Admitting: Internal Medicine

## 2019-05-14 DIAGNOSIS — E663 Overweight: Secondary | ICD-10-CM | POA: Diagnosis not present

## 2019-05-14 DIAGNOSIS — G459 Transient cerebral ischemic attack, unspecified: Secondary | ICD-10-CM

## 2019-05-14 DIAGNOSIS — F4489 Other dissociative and conversion disorders: Secondary | ICD-10-CM | POA: Diagnosis not present

## 2019-05-15 ENCOUNTER — Ambulatory Visit
Admission: RE | Admit: 2019-05-15 | Discharge: 2019-05-15 | Disposition: A | Discharge status: Home / Self Care | Payer: BC Managed Care – PPO | Source: Ambulatory Visit | Attending: Internal Medicine | Admitting: Internal Medicine

## 2019-05-15 ENCOUNTER — Other Ambulatory Visit: Payer: Self-pay

## 2019-05-15 ENCOUNTER — Other Ambulatory Visit: Payer: Self-pay | Admitting: Internal Medicine

## 2019-05-15 DIAGNOSIS — E875 Hyperkalemia: Secondary | ICD-10-CM | POA: Diagnosis not present

## 2019-05-15 DIAGNOSIS — G459 Transient cerebral ischemic attack, unspecified: Secondary | ICD-10-CM

## 2019-05-15 DIAGNOSIS — R479 Unspecified speech disturbances: Secondary | ICD-10-CM | POA: Diagnosis not present

## 2019-05-16 ENCOUNTER — Telehealth: Payer: Self-pay

## 2019-05-16 NOTE — Telephone Encounter (Signed)

## 2019-05-17 ENCOUNTER — Encounter: Payer: Self-pay | Admitting: Cardiology

## 2019-05-17 NOTE — Progress Notes (Signed)
Remote ICD transmission.   

## 2019-05-23 ENCOUNTER — Other Ambulatory Visit: Payer: BC Managed Care – PPO

## 2019-06-12 ENCOUNTER — Other Ambulatory Visit: Payer: BC Managed Care – PPO

## 2019-06-14 DIAGNOSIS — Z0000 Encounter for general adult medical examination without abnormal findings: Secondary | ICD-10-CM | POA: Diagnosis not present

## 2019-06-14 DIAGNOSIS — Z125 Encounter for screening for malignant neoplasm of prostate: Secondary | ICD-10-CM | POA: Diagnosis not present

## 2019-06-14 DIAGNOSIS — Z13220 Encounter for screening for lipoid disorders: Secondary | ICD-10-CM | POA: Diagnosis not present

## 2019-06-18 ENCOUNTER — Telehealth: Payer: Self-pay | Admitting: Pharmacist

## 2019-06-18 NOTE — Telephone Encounter (Signed)
1. COVID-19 Pre-Screening Questions:  . In the past 7 to 10 days have you had a cough,  shortness of breath, headache, congestion, fever (100 or greater) body aches, chills, sore throat, or sudden loss of taste or sense of smell?  no . Have you been around anyone with known Covid 19.  Yes but >21 days ago Wife tested postive-back on June 30th- pt never had symptoms . Have you been around anyone who is awaiting Covid 19 test results in the past 7 to 10 days?  no . Have you been around anyone who has been exposed to Covid 19, or has mentioned symptoms of Covid 19 within the past 7 to 10 days?  no    2. Pt advised of visitor restrictions (no visitors allowed except if needed to conduct the visit). Also advised to arrive at appointment time and wear a mask.

## 2019-06-19 ENCOUNTER — Other Ambulatory Visit: Payer: Self-pay

## 2019-06-19 ENCOUNTER — Other Ambulatory Visit: Payer: BC Managed Care – PPO

## 2019-06-19 ENCOUNTER — Ambulatory Visit (INDEPENDENT_AMBULATORY_CARE_PROVIDER_SITE_OTHER): Payer: BC Managed Care – PPO | Admitting: Pharmacist

## 2019-06-19 DIAGNOSIS — Z5181 Encounter for therapeutic drug level monitoring: Secondary | ICD-10-CM | POA: Diagnosis not present

## 2019-06-19 DIAGNOSIS — I513 Intracardiac thrombosis, not elsewhere classified: Secondary | ICD-10-CM

## 2019-06-19 DIAGNOSIS — I240 Acute coronary thrombosis not resulting in myocardial infarction: Secondary | ICD-10-CM

## 2019-06-19 LAB — POCT INR: INR: 4.6 — AB (ref 2.0–3.0)

## 2019-06-19 NOTE — Patient Instructions (Signed)
Description   Skip your Coumadin today and tomorrow, then decrease dose to 1/2 tablet daily except 1 tablet on Mondays. Continue eating about 1 serving of dark green leafy veggies weekly.  Recheck in 2 weeks. Call with any new medications or concerns, [phone]

## 2019-06-20 ENCOUNTER — Other Ambulatory Visit (HOSPITAL_COMMUNITY): Payer: Self-pay

## 2019-06-20 MED ORDER — WARFARIN SODIUM 10 MG PO TABS: 5.0000 mg | ORAL_TABLET | ORAL | 3 refills | Status: DC

## 2019-06-21 ENCOUNTER — Other Ambulatory Visit: Payer: BC Managed Care – PPO

## 2019-06-22 ENCOUNTER — Other Ambulatory Visit (HOSPITAL_COMMUNITY): Payer: Self-pay | Admitting: Cardiology

## 2019-06-26 ENCOUNTER — Other Ambulatory Visit: Payer: BC Managed Care – PPO

## 2019-06-26 ENCOUNTER — Ambulatory Visit
Admission: RE | Admit: 2019-06-26 | Discharge: 2019-06-26 | Disposition: A | Discharge status: Home / Self Care | Payer: BC Managed Care – PPO | Source: Ambulatory Visit | Attending: Internal Medicine | Admitting: Internal Medicine

## 2019-06-26 DIAGNOSIS — R4781 Slurred speech: Secondary | ICD-10-CM | POA: Diagnosis not present

## 2019-06-26 DIAGNOSIS — G459 Transient cerebral ischemic attack, unspecified: Secondary | ICD-10-CM

## 2019-07-03 ENCOUNTER — Ambulatory Visit (INDEPENDENT_AMBULATORY_CARE_PROVIDER_SITE_OTHER): Payer: BC Managed Care – PPO | Admitting: Pharmacist

## 2019-07-03 ENCOUNTER — Other Ambulatory Visit: Payer: Self-pay

## 2019-07-03 DIAGNOSIS — Z5181 Encounter for therapeutic drug level monitoring: Secondary | ICD-10-CM

## 2019-07-03 DIAGNOSIS — I240 Acute coronary thrombosis not resulting in myocardial infarction: Secondary | ICD-10-CM

## 2019-07-03 DIAGNOSIS — I513 Intracardiac thrombosis, not elsewhere classified: Secondary | ICD-10-CM

## 2019-07-03 LAB — POCT INR: INR: 2.6 (ref 2.0–3.0)

## 2019-07-03 NOTE — Patient Instructions (Signed)
Continue taking 1/2 tablet daily except 1 tablet on Mondays. Continue eating about 1 serving of dark green leafy veggies weekly.  Recheck in 4 weeks. Call with any new medications or concerns, [phone]

## 2019-07-12 DIAGNOSIS — Z01818 Encounter for other preprocedural examination: Secondary | ICD-10-CM | POA: Diagnosis not present

## 2019-07-16 ENCOUNTER — Telehealth (HOSPITAL_COMMUNITY): Payer: Self-pay

## 2019-07-16 NOTE — Telephone Encounter (Signed)
Called pt to schedule pre cpx covid test on 8/31. No answer, left voicemail for a call back.

## 2019-07-22 ENCOUNTER — Other Ambulatory Visit (HOSPITAL_COMMUNITY)
Admission: RE | Admit: 2019-07-22 | Discharge: 2019-07-22 | Disposition: A | Discharge status: Home / Self Care | Payer: BC Managed Care – PPO | Source: Ambulatory Visit | Attending: Cardiology | Admitting: Cardiology

## 2019-07-22 DIAGNOSIS — Z20828 Contact with and (suspected) exposure to other viral communicable diseases: Secondary | ICD-10-CM | POA: Insufficient documentation

## 2019-07-22 DIAGNOSIS — Z01812 Encounter for preprocedural laboratory examination: Secondary | ICD-10-CM | POA: Diagnosis not present

## 2019-07-22 LAB — SARS CORONAVIRUS 2 (TAT 6-24 HRS): SARS Coronavirus 2: NEGATIVE

## 2019-07-25 ENCOUNTER — Ambulatory Visit (HOSPITAL_COMMUNITY): Payer: BC Managed Care – PPO | Attending: Internal Medicine

## 2019-07-25 ENCOUNTER — Other Ambulatory Visit: Payer: Self-pay

## 2019-07-25 DIAGNOSIS — I5022 Chronic systolic (congestive) heart failure: Secondary | ICD-10-CM

## 2019-07-26 ENCOUNTER — Telehealth (HOSPITAL_COMMUNITY): Payer: Self-pay

## 2019-07-26 NOTE — Telephone Encounter (Signed)
-----   Message from Larey Dresser, MD sent at 07/25/2019  1:21 PM EDT ----- Moderate functional limitation due to HF

## 2019-07-26 NOTE — Telephone Encounter (Signed)
Pt aware of results 

## 2019-07-31 ENCOUNTER — Ambulatory Visit (INDEPENDENT_AMBULATORY_CARE_PROVIDER_SITE_OTHER): Payer: BC Managed Care – PPO | Admitting: *Deleted

## 2019-07-31 ENCOUNTER — Other Ambulatory Visit: Payer: Self-pay

## 2019-07-31 DIAGNOSIS — I240 Acute coronary thrombosis not resulting in myocardial infarction: Secondary | ICD-10-CM

## 2019-07-31 DIAGNOSIS — Z5181 Encounter for therapeutic drug level monitoring: Secondary | ICD-10-CM

## 2019-07-31 DIAGNOSIS — I513 Intracardiac thrombosis, not elsewhere classified: Secondary | ICD-10-CM

## 2019-07-31 LAB — POCT INR: INR: 1.3 — AB (ref 2.0–3.0)

## 2019-07-31 NOTE — Patient Instructions (Signed)
Description   Today and tomorrow take 1 tablet then continue taking 1/2 tablet daily except 1 tablet on Mondays. Continue eating about 1 serving of dark green leafy veggies weekly.  Recheck in 1 week. Call with any new medications or concerns, [phone]

## 2019-08-06 ENCOUNTER — Ambulatory Visit (INDEPENDENT_AMBULATORY_CARE_PROVIDER_SITE_OTHER): Payer: BC Managed Care – PPO | Admitting: *Deleted

## 2019-08-06 DIAGNOSIS — I5022 Chronic systolic (congestive) heart failure: Secondary | ICD-10-CM | POA: Diagnosis not present

## 2019-08-06 DIAGNOSIS — I259 Chronic ischemic heart disease, unspecified: Secondary | ICD-10-CM

## 2019-08-06 LAB — CUP PACEART REMOTE DEVICE CHECK
Battery Remaining Longevity: 156 mo
Battery Remaining Percentage: 100 %
Brady Statistic RA Percent Paced: 1 %
Brady Statistic RV Percent Paced: 2 %
Date Time Interrogation Session: 20200915064100
HighPow Impedance: 66 Ohm
Implantable Lead Implant Date: 20190910
Implantable Lead Implant Date: 20190910
Implantable Lead Location: 753859
Implantable Lead Location: 753860
Implantable Lead Model: 293
Implantable Lead Model: 7741
Implantable Lead Serial Number: 1054233
Implantable Lead Serial Number: 440947
Implantable Pulse Generator Implant Date: 20190910
Lead Channel Impedance Value: 472 Ohm
Lead Channel Impedance Value: 710 Ohm
Lead Channel Setting Pacing Amplitude: 2.5 V
Lead Channel Setting Pacing Amplitude: 2.5 V
Lead Channel Setting Pacing Pulse Width: 0.4 ms
Lead Channel Setting Sensing Sensitivity: 0.5 mV
Pulse Gen Serial Number: 550897

## 2019-08-08 ENCOUNTER — Ambulatory Visit (INDEPENDENT_AMBULATORY_CARE_PROVIDER_SITE_OTHER): Payer: BC Managed Care – PPO | Admitting: Pharmacist

## 2019-08-08 ENCOUNTER — Other Ambulatory Visit: Payer: Self-pay

## 2019-08-08 DIAGNOSIS — I240 Acute coronary thrombosis not resulting in myocardial infarction: Secondary | ICD-10-CM

## 2019-08-08 DIAGNOSIS — Z5181 Encounter for therapeutic drug level monitoring: Secondary | ICD-10-CM

## 2019-08-08 DIAGNOSIS — I513 Intracardiac thrombosis, not elsewhere classified: Secondary | ICD-10-CM

## 2019-08-08 LAB — POCT INR: INR: 2.1 (ref 2.0–3.0)

## 2019-08-08 NOTE — Patient Instructions (Signed)
Description   Continue taking 1/2 tablet daily except 1 tablet on Mondays. Continue eating about 1 serving of dark green leafy veggies weekly. Recheck in 4 weeks. Call with any new medications or concerns, [phone]     

## 2019-08-13 NOTE — Progress Notes (Signed)
Remote ICD transmission.   

## 2019-08-14 ENCOUNTER — Other Ambulatory Visit (HOSPITAL_COMMUNITY): Payer: Self-pay | Admitting: Cardiology

## 2019-08-18 ENCOUNTER — Other Ambulatory Visit (HOSPITAL_COMMUNITY): Payer: Self-pay | Admitting: Cardiology

## 2019-08-25 ENCOUNTER — Other Ambulatory Visit (HOSPITAL_COMMUNITY): Payer: Self-pay | Admitting: Cardiology

## 2019-09-04 ENCOUNTER — Other Ambulatory Visit: Payer: Self-pay

## 2019-09-04 ENCOUNTER — Ambulatory Visit (INDEPENDENT_AMBULATORY_CARE_PROVIDER_SITE_OTHER): Payer: BC Managed Care – PPO | Admitting: Pharmacist

## 2019-09-04 DIAGNOSIS — I240 Acute coronary thrombosis not resulting in myocardial infarction: Secondary | ICD-10-CM

## 2019-09-04 DIAGNOSIS — I513 Intracardiac thrombosis, not elsewhere classified: Secondary | ICD-10-CM

## 2019-09-04 DIAGNOSIS — Z5181 Encounter for therapeutic drug level monitoring: Secondary | ICD-10-CM

## 2019-09-04 LAB — POCT INR: INR: 2 (ref 2.0–3.0)

## 2019-09-04 NOTE — Patient Instructions (Signed)
Description   Continue taking 1/2 tablet daily except 1 tablet on Mondays. Continue eating about 1 serving of dark green leafy veggies weekly. Recheck in 4 weeks. Call with any new medications or concerns, [phone]     

## 2019-09-20 ENCOUNTER — Other Ambulatory Visit: Payer: Self-pay

## 2019-09-20 ENCOUNTER — Encounter: Payer: Self-pay | Admitting: Internal Medicine

## 2019-09-20 ENCOUNTER — Ambulatory Visit (INDEPENDENT_AMBULATORY_CARE_PROVIDER_SITE_OTHER): Payer: BC Managed Care – PPO | Admitting: Internal Medicine

## 2019-09-20 VITALS — BP 90/62 | HR 66 | Ht 73.0 in | Wt 210.4 lb

## 2019-09-20 DIAGNOSIS — I5022 Chronic systolic (congestive) heart failure: Secondary | ICD-10-CM | POA: Diagnosis not present

## 2019-09-20 DIAGNOSIS — Z95810 Presence of automatic (implantable) cardiac defibrillator: Secondary | ICD-10-CM

## 2019-09-20 NOTE — Progress Notes (Signed)
HPI [name] returns today for followup. He is a pleasant 63 yo man with a h/o chronic systolic heart failure, s/p silent MI remotely, s/p ICD insertion over a year ago. In the interim he has done well and has lost 20 lbs. He denies chest pain or sob. No syncope or ICD shock. No Known Allergies   Current Outpatient Medications  Medication Sig Dispense Refill  . carvedilol (COREG) 25 MG tablet TAKE 1 TABLET BY MOUTH TWICE DAILY WITH MEALS 60 tablet 6  . ENTRESTO 97-103 MG TAKE 1 TABLET BY MOUTH TWICE DAILY 60 tablet 6  . FARXIGA 10 MG TABS tablet TAKE 1 TABLET BY MOUTH DAILY 30 tablet 6  . glimepiride (AMARYL) 2 MG tablet Take 2 mg by mouth daily with breakfast.    . rosuvastatin (CRESTOR) 40 MG tablet TAKE 1 TABLET BY MOUTH DAILY. DISCONTINUE ATORVASTATIN 30 tablet 2  . spironolactone (ALDACTONE) 25 MG tablet TAKE 1 TABLET(25 MG) BY MOUTH DAILY 30 tablet 6  . warfarin (COUMADIN) 10 MG tablet Take 0.5-1 tablets (5-10 mg total) by mouth See admin instructions. Take 5mg  by mouth on Monday and Shawn Webster. Take 10mg  on Tuesday, Thursday, Friday, Saturday and Sunday 30 tablet 3   No current facility-administered medications for this visit.      Past Medical History:  Diagnosis Date  . Bell's palsy   . CHF (congestive heart failure) (HCC)   . Hypertension     ROS:   All systems reviewed and negative except as noted in the HPI.   Past Surgical History:  Procedure Laterality Date  . HERNIA REPAIR    . ICD IMPLANT N/A 07/31/2018   Procedure: ICD IMPLANT;  Surgeon: Wednesday, Shawn Webster;  Location: [facility] INVASIVE CV LAB;  Service: Cardiovascular;  Laterality: N/A;  . RIGHT/LEFT HEART CATH AND CORONARY ANGIOGRAPHY N/A 09/15/2017   Procedure: RIGHT/LEFT HEART CATH AND CORONARY ANGIOGRAPHY;  Surgeon: Shawn [facility], Shawn Webster;  Location: [facility] INVASIVE CV LAB;  Service: Cardiovascular;  Laterality: N/A;  . ULTRASOUND GUIDANCE FOR VASCULAR ACCESS  09/15/2017   Procedure: Ultrasound Guidance For  Vascular Access;  Surgeon: Shawn [facility], Shawn Webster;  Location: [facility] INVASIVE CV LAB;  Service: Cardiovascular;;     Family History  Problem Relation Age of Onset  . Dementia Mother   . CAD Father      Social History   Socioeconomic History  . Marital status: Married    Spouse name: Not on file  . Number of children: Not on file  . Years of education: Not on file  . Highest education level: Not on file  Occupational History  . Not on file  Social Needs  . Financial resource strain: Not on file  . Food insecurity    Worry: Not on file    Inability: Not on file  . Transportation needs    Medical: Not on file    Non-medical: Not on file  Tobacco Use  . Smoking status: Former Smoker    Types: Cigars    Quit date: 08/21/2017    Years since quitting: 2.0  . Smokeless tobacco: Never Used  . Tobacco comment: 1 cigar per day  Substance and Sexual Activity  . Alcohol use: Yes    Comment: 2 martinis per day  . Drug use: No  . Sexual activity: Not on file  Lifestyle  . Physical activity    Days per week: Not on file    Minutes per session: Not on file  .  Stress: Not on file  Relationships  . Social Herbalist on phone: Not on file    Gets together: Not on file    Attends religious service: Not on file    Active member of club or organization: Not on file    Attends meetings of clubs or organizations: Not on file    Relationship status: Not on file  . Intimate partner violence    Fear of current or ex partner: Not on file    Emotionally abused: Not on file    Physically abused: Not on file    Forced sexual activity: Not on file  Other Topics Concern  . Not on file  Social History Narrative  . Not on file     BP 90/62   Pulse 66   Ht 6\' 1"  (1.854 m)   Wt 210 lb 6.4 oz (95.4 kg)   SpO2 98%   BMI 27.76 kg/m   Physical Exam:  Well appearing NAD HEENT: Unremarkable Neck:  No JVD, no thyromegally Lymphatics:  No adenopathy Back:  No CVA tenderness  Lungs:  Clear with no wheezes HEART:  Regular rate rhythm, no murmurs, no rubs, no clicks Abd:  soft, positive bowel sounds, no organomegally, no rebound, no guarding Ext:  2 plus pulses, no edema, no cyanosis, no clubbing Skin:  No rashes no nodules Neuro:  CN II through XII intact, motor grossly intact  EKG - nsr with poor R wave progression  DEVICE  Normal device function.  See PaceArt for details.   Assess/Plan: 1. Chronic systolic heart failure - his symptoms are class 2. He will continue his current meds. 2. ICD - his Shawn Webster ICD is working normally. 3. 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 No symptoms.   Shawn Webster, M.D.

## 2019-09-20 NOTE — Patient Instructions (Signed)

## 2019-09-26 DIAGNOSIS — I509 Heart failure, unspecified: Secondary | ICD-10-CM | POA: Diagnosis not present

## 2019-09-26 DIAGNOSIS — Z20828 Contact with and (suspected) exposure to other viral communicable diseases: Secondary | ICD-10-CM | POA: Diagnosis not present

## 2019-09-27 DIAGNOSIS — Z20828 Contact with and (suspected) exposure to other viral communicable diseases: Secondary | ICD-10-CM | POA: Diagnosis not present

## 2019-10-02 ENCOUNTER — Other Ambulatory Visit: Payer: Self-pay

## 2019-10-02 ENCOUNTER — Ambulatory Visit (INDEPENDENT_AMBULATORY_CARE_PROVIDER_SITE_OTHER): Payer: BC Managed Care – PPO | Admitting: *Deleted

## 2019-10-02 ENCOUNTER — Telehealth: Payer: Self-pay | Admitting: Internal Medicine

## 2019-10-02 DIAGNOSIS — Z5181 Encounter for therapeutic drug level monitoring: Secondary | ICD-10-CM

## 2019-10-02 DIAGNOSIS — I240 Acute coronary thrombosis not resulting in myocardial infarction: Secondary | ICD-10-CM

## 2019-10-02 DIAGNOSIS — I513 Intracardiac thrombosis, not elsewhere classified: Secondary | ICD-10-CM | POA: Diagnosis not present

## 2019-10-02 LAB — POCT INR: INR: 2.8 (ref 2.0–3.0)

## 2019-10-02 NOTE — Telephone Encounter (Signed)
New Message    Pt is returning a call he says he gotten from Oda Kilts T    Please call  Back

## 2019-10-02 NOTE — Patient Instructions (Signed)
Description   Continue taking 1/2 tablet daily except 1 tablet on Mondays. Continue eating about 1 serving of dark green leafy veggies weekly.  Recheck in 5 weeks. Call with any new medications or concerns, [phone]

## 2019-10-03 NOTE — Telephone Encounter (Signed)
Discussed with patient. Set up appt in December to discuss barostim.

## 2019-10-14 ENCOUNTER — Other Ambulatory Visit (HOSPITAL_COMMUNITY): Payer: Self-pay

## 2019-10-14 MED ORDER — WARFARIN SODIUM 10 MG PO TABS: ORAL_TABLET | ORAL | 3 refills | Status: DC

## 2019-10-28 ENCOUNTER — Ambulatory Visit: Payer: BC Managed Care – PPO | Admitting: Student

## 2019-10-28 ENCOUNTER — Encounter: Payer: Self-pay | Admitting: Student

## 2019-10-28 ENCOUNTER — Other Ambulatory Visit: Payer: Self-pay

## 2019-10-28 VITALS — BP 130/62 | HR 102 | Ht 73.0 in | Wt 212.0 lb

## 2019-10-28 DIAGNOSIS — R0602 Shortness of breath: Secondary | ICD-10-CM

## 2019-10-28 DIAGNOSIS — Z95810 Presence of automatic (implantable) cardiac defibrillator: Secondary | ICD-10-CM | POA: Diagnosis not present

## 2019-10-28 NOTE — Progress Notes (Signed)
Electrophysiology Office Note Date: 10/28/2019  ID:  Shawn, Webster 1956/10/15, MRN [id number]  PCP: Renford Dills, MD Primary Cardiologist: Marca Ancona, MD Electrophysiologist: Dr. Ladona Ridgel   CC: Routine ICD follow-up  Shawn Webster is a 63 y.o. male seen today to discuss consideration for Barostim. Since last being seen in our clinic, the patient reports doing very well. They deny chest pain, palpitations, PND, orthopnea, nausea, vomiting, dizziness, syncope, edema, weight gain, or early satiety.  He has not had ICD shocks.    He gets mild SOB walking up hills.  He denies SOB with ADLs. Recent CPX showed moderate limitation from a cardiac perspective.   Device History: Field seismologist ICD implanted 07/31/2018 for ischemic cardiomyopathy, CHF History of appropriate therapy: No History of AAD therapy: No   Past Medical History:  Diagnosis Date  . Bell's palsy   . CHF (congestive heart failure) (HCC)   . Hypertension    Past Surgical History:  Procedure Laterality Date  . HERNIA REPAIR    . ICD IMPLANT N/A 07/31/2018   Procedure: ICD IMPLANT;  Surgeon: Marinus Maw, MD;  Location: [facility] INVASIVE CV LAB;  Service: Cardiovascular;  Laterality: N/A;  . RIGHT/LEFT HEART CATH AND CORONARY ANGIOGRAPHY N/A 09/15/2017   Procedure: RIGHT/LEFT HEART CATH AND CORONARY ANGIOGRAPHY;  Surgeon: Laurey Morale, MD;  Location: [facility] INVASIVE CV LAB;  Service: Cardiovascular;  Laterality: N/A;  . ULTRASOUND GUIDANCE FOR VASCULAR ACCESS  09/15/2017   Procedure: Ultrasound Guidance For Vascular Access;  Surgeon: Laurey Morale, MD;  Location: [facility] INVASIVE CV LAB;  Service: Cardiovascular;;    Current Outpatient Medications  Medication Sig Dispense Refill  . carvedilol (COREG) 25 MG tablet TAKE 1 TABLET BY MOUTH TWICE DAILY WITH MEALS 60 tablet 6  . ENTRESTO 97-103 MG TAKE 1 TABLET BY MOUTH TWICE DAILY 60 tablet 6  . FARXIGA 10 MG TABS tablet TAKE 1 TABLET BY MOUTH  DAILY 30 tablet 6  . glimepiride (AMARYL) 2 MG tablet Take 2 mg by mouth daily with breakfast.    . rosuvastatin (CRESTOR) 40 MG tablet TAKE 1 TABLET BY MOUTH DAILY. DISCONTINUE ATORVASTATIN 30 tablet 2  . spironolactone (ALDACTONE) 25 MG tablet TAKE 1 TABLET(25 MG) BY MOUTH DAILY 30 tablet 6  . warfarin (COUMADIN) 10 MG tablet Take 1/2 to 1 tablet daily as directed by Coumadin clinic 30 tablet 3   No current facility-administered medications for this visit.     Allergies:   Patient has no known allergies.   Social History: Social History   Socioeconomic History  . Marital status: Married    Spouse name: Not on file  . Number of children: Not on file  . Years of education: Not on file  . Highest education level: Not on file  Occupational History  . Not on file  Social Needs  . Financial resource strain: Not on file  . Food insecurity    Worry: Not on file    Inability: Not on file  . Transportation needs    Medical: Not on file    Non-medical: Not on file  Tobacco Use  . Smoking status: Former Smoker    Types: Cigars    Quit date: 08/21/2017    Years since quitting: 2.1  . Smokeless tobacco: Never Used  . Tobacco comment: 1 cigar per day  Substance and Sexual Activity  . Alcohol use: Yes    Comment: 2 martinis per day  . Drug use: No  .  Sexual activity: Not on file  Lifestyle  . Physical activity    Days per week: Not on file    Minutes per session: Not on file  . Stress: Not on file  Relationships  . Social Musician on phone: Not on file    Gets together: Not on file    Attends religious service: Not on file    Active member of club or organization: Not on file    Attends meetings of clubs or organizations: Not on file    Relationship status: Not on file  . Intimate partner violence    Fear of current or ex partner: Not on file    Emotionally abused: Not on file    Physically abused: Not on file    Forced sexual activity: Not on file  Other  Topics Concern  . Not on file  Social History Narrative  . Not on file    Family History: Family History  Problem Relation Age of Onset  . Dementia Mother   . CAD Father     Review of Systems: All other systems reviewed and are otherwise negative except as noted above.   Physical Exam: Vitals:   10/28/19 1117  BP: 130/62  Pulse: (!) 102  SpO2: 99%  Weight: 212 lb (96.2 kg)  Height: 6\' 1"  (1.854 m)     GEN- The patient is well appearing, alert and oriented x 3 today.   HEENT: normocephalic, atraumatic; sclera clear, conjunctiva pink; hearing intact; oropharynx clear; neck supple, no JVP Lymph- no cervical lymphadenopathy Lungs- Clear to ausculation bilaterally, normal work of breathing.  No wheezes, rales, rhonchi Heart- Regular rate and rhythm, no murmurs, rubs or gallops, PMI not laterally displaced GI- soft, non-tender, non-distended, bowel sounds present, no hepatosplenomegaly Extremities- no clubbing, cyanosis, or edema; DP/PT/radial pulses 2+ bilaterally MS- no significant deformity or atrophy Skin- warm and dry, no rash or lesion; ICD pocket well healed Psych- euthymic mood, full affect Neuro- strength and sensation are intact  ICD interrogation- reviewed in detail today,  See PACEART report  EKG:  EKG is ordered today. The ekg ordered today shows difficult baseline. Suspect NSR at 66 bpm consistent with previous baseline.  Rhythm strip shows smoothing of baseline with no change in R-R interval, so doubt Afib in this difficult to read EKG.   Recent Labs: 02/19/2019: BUN 19; Creatinine, Ser 1.05; Potassium 4.7; Sodium 137   Wt Readings from Last 3 Encounters:  10/28/19 212 lb (96.2 kg)  09/20/19 210 lb 6.4 oz (95.4 kg)  02/05/19 229 lb (103.9 kg)     Assessment and Plan:  1.  Chronic systolic dysfunction s/p [employer] dual chamber ICD  euvolemic today Stable on an appropriate medical regimen Normal ICD function See Pace Art report No changes  today  2. Barostim consideration NYHA II-III symptoms, may need further clarification ( ) Body mass index is 27.97 kg/m.  Cr is normal 0.9 - 1.0 CPX test 07/25/2019 showed moderate functional limitation due to HF Echo 01/2019 showed LVEF 25-30% Carotids with mild atherosclerosis on [place] 06/26/2019 in setting of TIA Coverage is commercial/BCBS Will check Pro-BNP today.  QRS 90-100 ms  Current medicines are reviewed at length with the patient today.   The patient does not have concerns regarding his medicines.  The following changes were made today:  none  Labs/ tests ordered today include:  Orders Placed This Encounter  Procedures  . Basic metabolic panel  . Pro b natriuretic peptide  .  EKG 12-Lead   Disposition:   Follow up with Dr. Lovena Le as scheduled (yearly). Will plan further consideration for BAT-Wire/Barostim pending labwork today.  Zyaire, Mccleod, PA-C  10/28/2019 2:40 PM  [address] [place] [address] [phone] (office) [phone] (fax)

## 2019-10-28 NOTE — Patient Instructions (Addendum)
Medication Instructions:  none *If you need a refill on your cardiac medications before your next appointment, please call your pharmacy*  Lab Work:TODAY BMET BNP  If you have labs (blood work) drawn today and your tests are completely normal, you will receive your results only by: Marland Kitchen MyChart Message (if you have MyChart) OR . A paper copy in the mail If you have any lab test that is abnormal or we need to change your treatment, we will call you to review the results.  Testing/Procedures: none  Follow-Up: tbd At [facility], you and your health needs are our priority.  As part of our continuing mission to provide you with exceptional heart care, we have created designated Provider Care Teams.  These Care Teams include your primary Cardiologist (physician) and Advanced Practice Providers (APPs -  Physician Assistants and Nurse Practitioners) who all work together to provide you with the care you need, when you need it.   Other Instructions Remote monitoring is used to monitor your  ICD from home. This monitoring reduces the number of office visits required to check your device to one time per year. It allows [place] to keep an eye on the functioning of your device to ensure it is working properly. You are scheduled for a device check from home on 11/05/19. You may send your transmission at any time that day. If you have a wireless device, the transmission will be sent automatically. After your physician reviews your transmission, you will receive a postcard with your next transmission date.

## 2019-10-29 ENCOUNTER — Telehealth: Payer: Self-pay

## 2019-10-29 LAB — BASIC METABOLIC PANEL
BUN/Creatinine Ratio: 16 (ref 10–24)
BUN: 15 mg/dL (ref 8–27)
CO2: 23 mmol/L (ref 20–29)
Calcium: 8.9 mg/dL (ref 8.6–10.2)
Chloride: 104 mmol/L (ref 96–106)
Creatinine, Ser: 0.93 mg/dL (ref 0.76–1.27)
GFR calc Af Amer: 101 mL/min/{1.73_m2} (ref >59)
GFR calc non Af Amer: 88 mL/min/{1.73_m2} (ref >59)
Glucose: 106 mg/dL — ABNORMAL HIGH (ref 65–99)
Potassium: 5 mmol/L (ref 3.5–5.2)
Sodium: 140 mmol/L (ref 134–144)

## 2019-10-29 LAB — PRO B NATRIURETIC PEPTIDE: NT-Pro BNP: 386 pg/mL — ABNORMAL HIGH (ref 0–210)

## 2019-10-29 NOTE — Telephone Encounter (Signed)
-----   Message from Shirley Friar, Shawn Webster sent at 10/29/2019 11:22 AM EST ----- Labs stable. Meets criteria for Barostim consideration. With body habitus (Body mass index is 27.97 kg/m.) I think he would be a good candidate specifically for BATWIRE (study) consideration.   I will forward his name to research team.  Beryle Beams" Nimrod, Shawn Webster  10/29/2019 11:21 AM

## 2019-10-29 NOTE — Telephone Encounter (Signed)
Notes recorded by Frederik Schmidt, RN on 10/29/2019 at 11:34 AM EST  The patient has been notified of the result and verbalized understanding. All questions (if any) were answered.  Berlin Mokry, RN 10/29/2019 11:34 AM

## 2019-11-01 ENCOUNTER — Ambulatory Visit (INDEPENDENT_AMBULATORY_CARE_PROVIDER_SITE_OTHER): Payer: BC Managed Care – PPO | Admitting: *Deleted

## 2019-11-01 ENCOUNTER — Other Ambulatory Visit: Payer: Self-pay

## 2019-11-01 DIAGNOSIS — Z5181 Encounter for therapeutic drug level monitoring: Secondary | ICD-10-CM | POA: Diagnosis not present

## 2019-11-01 DIAGNOSIS — I513 Intracardiac thrombosis, not elsewhere classified: Secondary | ICD-10-CM | POA: Diagnosis not present

## 2019-11-01 DIAGNOSIS — I240 Acute coronary thrombosis not resulting in myocardial infarction: Secondary | ICD-10-CM

## 2019-11-01 LAB — POCT INR: INR: 2.4 (ref 2.0–3.0)

## 2019-11-01 NOTE — Patient Instructions (Signed)
Description   Continue taking 1/2 tablet daily except 1 tablet on Mondays. Continue eating about 1 serving of dark green leafy veggies weekly. Recheck in 6 weeks. Call with any new medications or concerns, [phone]

## 2019-11-05 ENCOUNTER — Ambulatory Visit (INDEPENDENT_AMBULATORY_CARE_PROVIDER_SITE_OTHER): Payer: BC Managed Care – PPO | Admitting: *Deleted

## 2019-11-05 DIAGNOSIS — I5022 Chronic systolic (congestive) heart failure: Secondary | ICD-10-CM | POA: Diagnosis not present

## 2019-11-05 LAB — CUP PACEART REMOTE DEVICE CHECK
Battery Remaining Longevity: 156 mo
Battery Remaining Percentage: 100 %
Brady Statistic RA Percent Paced: 0 %
Brady Statistic RV Percent Paced: 2 %
Date Time Interrogation Session: 20201215024200
HighPow Impedance: 73 Ohm
Implantable Lead Implant Date: 20190910
Implantable Lead Implant Date: 20190910
Implantable Lead Location: 753859
Implantable Lead Location: 753860
Implantable Lead Model: 293
Implantable Lead Model: 7741
Implantable Lead Serial Number: 1054233
Implantable Lead Serial Number: 440947
Implantable Pulse Generator Implant Date: 20190910
Lead Channel Impedance Value: 457 Ohm
Lead Channel Impedance Value: 769 Ohm
Lead Channel Setting Pacing Amplitude: 2.5 V
Lead Channel Setting Pacing Amplitude: 2.5 V
Lead Channel Setting Pacing Pulse Width: 0.4 ms
Lead Channel Setting Sensing Sensitivity: 0.5 mV
Pulse Gen Serial Number: 550897

## 2019-11-19 ENCOUNTER — Other Ambulatory Visit (HOSPITAL_COMMUNITY): Payer: Self-pay

## 2019-11-19 MED ORDER — ROSUVASTATIN CALCIUM 40 MG PO TABS: ORAL_TABLET | ORAL | 3 refills | Status: DC

## 2019-12-07 NOTE — Progress Notes (Signed)
ICD remote 

## 2019-12-13 ENCOUNTER — Ambulatory Visit (INDEPENDENT_AMBULATORY_CARE_PROVIDER_SITE_OTHER): Payer: BC Managed Care – PPO | Admitting: *Deleted

## 2019-12-13 ENCOUNTER — Other Ambulatory Visit: Payer: Self-pay

## 2019-12-13 DIAGNOSIS — I240 Acute coronary thrombosis not resulting in myocardial infarction: Secondary | ICD-10-CM

## 2019-12-13 DIAGNOSIS — Z5181 Encounter for therapeutic drug level monitoring: Secondary | ICD-10-CM | POA: Diagnosis not present

## 2019-12-13 DIAGNOSIS — I513 Intracardiac thrombosis, not elsewhere classified: Secondary | ICD-10-CM | POA: Diagnosis not present

## 2019-12-13 LAB — POCT INR: INR: 1.9 — AB (ref 2.0–3.0)

## 2019-12-13 NOTE — Patient Instructions (Signed)
Description    Take 1 tablet today and then continue taking 1/2 tablet daily except 1 tablet on Mondays. Continue eating about 1 serving of dark green leafy veggies weekly. Recheck in 4 weeks. Call with any new medications or concerns, [phone]

## 2019-12-17 DIAGNOSIS — G459 Transient cerebral ischemic attack, unspecified: Secondary | ICD-10-CM | POA: Diagnosis not present

## 2019-12-17 DIAGNOSIS — E1169 Type 2 diabetes mellitus with other specified complication: Secondary | ICD-10-CM | POA: Diagnosis not present

## 2019-12-17 DIAGNOSIS — I255 Ischemic cardiomyopathy: Secondary | ICD-10-CM | POA: Diagnosis not present

## 2019-12-17 DIAGNOSIS — I2510 Atherosclerotic heart disease of native coronary artery without angina pectoris: Secondary | ICD-10-CM | POA: Diagnosis not present

## 2019-12-17 DIAGNOSIS — Z7901 Long term (current) use of anticoagulants: Secondary | ICD-10-CM | POA: Diagnosis not present

## 2020-01-10 ENCOUNTER — Ambulatory Visit (INDEPENDENT_AMBULATORY_CARE_PROVIDER_SITE_OTHER): Payer: BC Managed Care – PPO | Admitting: *Deleted

## 2020-01-10 ENCOUNTER — Other Ambulatory Visit: Payer: Self-pay

## 2020-01-10 DIAGNOSIS — I240 Acute coronary thrombosis not resulting in myocardial infarction: Secondary | ICD-10-CM | POA: Diagnosis not present

## 2020-01-10 DIAGNOSIS — Z5181 Encounter for therapeutic drug level monitoring: Secondary | ICD-10-CM

## 2020-01-10 DIAGNOSIS — I513 Intracardiac thrombosis, not elsewhere classified: Secondary | ICD-10-CM | POA: Diagnosis not present

## 2020-01-10 LAB — POCT INR: INR: 3.1 — AB (ref 2.0–3.0)

## 2020-01-10 NOTE — Patient Instructions (Addendum)
Description   Continue taking 1/2 tablet daily except 1 tablet on Mondays. Continue eating about 1 serving of dark green leafy veggies weekly.  Eat an extra serving of greens today. Recheck in 4 weeks. Call with any new medications or concerns, [phone]

## 2020-01-14 ENCOUNTER — Encounter: Payer: BC Managed Care – PPO | Admitting: *Deleted

## 2020-01-14 ENCOUNTER — Other Ambulatory Visit: Payer: Self-pay

## 2020-01-14 VITALS — BP 86/67 | HR 71 | Ht 73.0 in | Wt 207.4 lb

## 2020-01-14 DIAGNOSIS — Z006 Encounter for examination for normal comparison and control in clinical research program: Secondary | ICD-10-CM

## 2020-01-14 DIAGNOSIS — I5022 Chronic systolic (congestive) heart failure: Secondary | ICD-10-CM

## 2020-01-14 NOTE — Research (Addendum)
Section A:  Administrative Section  Subject ID: _[id number]_ - _[id number]_ - [id number] Subject Initials:   MAG_  Date subject signed informed consent  __23__/Feb/_2021__      (DD /  MMM      / YYYY)  Section B:  Enrollment Criteria  Does the subject meet the FDA Indication for Use:  Patients who remain symptomatic despite treatment with guideline-directed medical therapy, are NYHA Class III or Class II (who had a recent history of Class III), have a left ventricular ejection fraction ? 35%, a NT-proBNP < 1600 pg/ml and excluding patients indicated for Cardiac Resynchronization Therapy (CRT) according to AHA/ACC/ESC guidelines _0  Yes    _1  No (STOP - subject does not qualify for the study)  Has the subject been previously, or are currently, randomized in the CVRx BeAT-HF Trial? _2  Yes (STOP - subject does not qualify for the study)   _3  No  Has the subject received cardiac resynchronization therapy (CRT) within six months of enrollment, or is actively receiving CRT? _4  Yes (STOP - subject does not qualify for the study)   _5  No  Section B:  Screening/Baseline Assessments   Physical Assessment:            Date:   _23/_FEB_/_2021_  (DD / MMM / YYYY)  Weight: __207_  _6  kg _7  pounds Height: __185__8  cm _9  inches  Blood Pressure: _86_  / _67_mmHg Heart Rate: ___71____ bpm  Section C:  Labs   eGFR:  ______ mL/min/1.39m Date:    _23_/_FEB_/_2021  (DD / MMM / YDallas Breeding  Negative Pregnancy Test? _10  N/A Date:    _____/_______/_______                  (DD / MMM / YYYY)   _11  Yes     _12  No   Section D:  Appropriate Surgical/Study Candidate:   Is the subject able to discontinue the use of antiplatelet drugs (e.g. aspirin) in advance of the procedure, if required? _13  Yes   _14  No  Assessed by:   ________________________ Implanting Physician _15  Yes Date:    _____/_______/_______                 (DD / MMM / YDallas Breeding   _16  No   Section E:  Inclusion/Exclusion Criteria  Does the subject meet all Inclusion Criteria? _17   Yes   _18  No (STOP - subject does not qualify for the study)   Check all that apply   _19  1. Age at least 89years and no more than 80 years at the time of enrollment.   _20  2. Appropriate candidate for the surgery as determined by an evaluation from the implanting physician using a carotid duplex ultrasound (CDU) [or Computed Tomography Angiography (CTA) if CDU inconclusive or incomplete] and a review of medical history (including existence of infections that may increase implant risk).  Evaluation must confirm the following within 30 days of the BAROSTIM NEO implant: . Appropriate medical condition and medical history for implantation of the BAROSTIM NEO System AND . Anatomy that enables this implant procedure, with no vascular structures or orientations or neck anomalies that would be obstructive to the implantation path AND . The artery planned for the BAROSTIM NEO implant must have: o A carotid bifurcation below the level of the mandible AND o No ulcerative carotid arterial plaques AND o No carotid atherosclerosis producing a 30% or greater stenosis in linear diameter in the internal carotid AND o No carotid atherosclerosis producing a 30%  or greater stenosis in linear diameter in the distal common carotid AND . Have had no prior surgery, radiation, or endovascular stent placement in the carotid artery or the carotid sinus region AND . Able to discontinue the use of antiplatelet drugs (e.g. aspirin) in advance of the procedure, if required   _0  3. Six-minute hall walk (6MHW) ? 150 m AND ? 400 m within 30 days prior to implant.   _1  4. Serum estimated glomerular filtration rate (eGFR) ? 25 mL/min/1.73 m2 using the CKD-EPI method within 30 days prior to the [facility] NEO implant.   _2  5. Body mass index ? 40 kg/m2 within 30 days prior to the [facility] NEO implant.   _3  6. If male and of childbearing potential, must use a medically accepted method of birth control (e.g., barrier method with  spermicide, oral contraceptive, or abstinence) and agree to continue use of this method for the duration of the study. Women of childbearing potential must have a negative pregnancy test within 14 days prior to the [facility] NEO implant.   _4  7. Subjects implanted with a cardiac rhythm management device that does not utilize an intracardiac lead, or implanted with a neurostimulation device, must be approved by the [place].   _5  8. Signed a CVRx-approved informed consent form for participation in this study.      Does the subject meet any Exclusion Criteria? _6  Yes (STOP - subject does not qualify for the study)   _7  No     List criteria # met: ___9_______________________   _8  1. Previously or currently randomized in the CVRx BeAT-HF Trial.   _9  2. Received cardiac resynchronization therapy (CRT) within six months of enrollment or is actively receiving CRT.   _10  3. Any of the following contraindications: . Baroreflex failure or autonomic neuropathy  . Uncontrolled, symptomatic cardiac bradyarrhythmias . Known allergy to silicone or titanium   _11  4. Unstable ventricular arrhythmias.   _12  5. Presence of baseline cranial nerve dysfunction determined by the Ear, Nose and Throat (ENT) examination.   _13  6. Subjects with any surgery that has occurred, or is planned to occur, within 45 days of the [facility] NEO implant.   _14  7. Recent history (within 6 months of implant) of significant and uncontrolled bleeding.   _15  8. Known and untreated hypercoagulability state.   _16  9. An inappropriate study candidate as evidenced by: Marland Kitchen Solid organ or hematologic transplant, or currently being evaluated for an organ transplant. . Has received or is receiving LVAD therapy or chronic dialysis. . Current or planned treatment with intravenous positive inotrope therapy. . Primary pulmonary hypertension. . Severe COPD or severe restrictive lung disease (e.g. requires chronic oral steroid use or home  oxygen use). .  Heart failure secondary to a reversible cause, such as cardiac structural valvular disease, acute myocarditis and pericardial constriction. . Clinically significant cardiac structural valvular disease. .  Unable or unwilling to fulfill the protocol medication compliance and follow-up requirements, for reasons including but not limited to an unresolved history of alcohol or substance abuse or psychiatric disorder. . Active malignancy. .  Any other serious medical condition that may adversely affect the safety of the participant or validity of the study, in the opinion of the investigator. . Life expectancy less than one year.   _17  10. Any of the following within 3 months prior to the [facility] NEO implant. . Myocardial infarction . Unstable angina . Coronary intervention (e.g. CABG or PTCA)  . Cerebral vascular accident or  transient ischemic attack . Sudden cardiac death  . Surgical cardiac intervention (e.g. cardiac ablation, valve replacement)   _0  11. Enrolled and active in another (e.g. device, pharmaceutical, or biological) clinical study unless approved by the [place].  Section F:  Adverse Events  Were there any Adverse Events that occurred since consent? _1  Yes (complete AE form)   _2  No  Section H:  Medication Changes   Have there been any changes to the subject's home use medications for Arrhythmia, Antiplatelet/Anticoagulation and Heart failure medications during screening and baseline? _3  Yes (update Med form)   _4  No  Section I:  Signature    Person completing form (Print Name): __Kimberly Alba Destine :) _____________________________________________________    Signature: ___ Philemon Kingdom :) ___________________________________________ Date: _____07/27/2021_____________________

## 2020-01-14 NOTE — Research (Signed)
BatWire  Informed Consent   Subject Name: Shawn Webster  Subject met inclusion and exclusion criteria.  The informed consent form, study requirements and expectations were reviewed with the subject and questions and concerns were addressed prior to the signing of the consent form.  The subject verbalized understanding of the trial requirements.  The subject agreed to participate in the BatWire trial and signed the informed consent at 1045 on 01/14/2020.  The informed consent was obtained prior to performance of any protocol-specific procedures for the subject.  A copy of the signed informed consent was given to the subject and a copy was placed in the subject's medical record.   Philemon Kingdom D

## 2020-01-15 ENCOUNTER — Other Ambulatory Visit (HOSPITAL_COMMUNITY): Payer: Self-pay

## 2020-01-15 ENCOUNTER — Telehealth: Payer: Self-pay | Admitting: *Deleted

## 2020-01-15 LAB — BASIC METABOLIC PANEL
BUN/Creatinine Ratio: 20 (ref 10–24)
BUN: 25 mg/dL (ref 8–27)
CO2: 20 mmol/L (ref 20–29)
Calcium: 8.9 mg/dL (ref 8.6–10.2)
Chloride: 103 mmol/L (ref 96–106)
Creatinine, Ser: 1.24 mg/dL (ref 0.76–1.27)
GFR calc Af Amer: 72 mL/min/{1.73_m2} (ref >59)
GFR calc non Af Amer: 62 mL/min/{1.73_m2} (ref >59)
Glucose: 74 mg/dL (ref 65–99)
Potassium: 6 mmol/L — ABNORMAL HIGH (ref 3.5–5.2)
Sodium: 136 mmol/L (ref 134–144)

## 2020-01-15 MED ORDER — CARVEDILOL 25 MG PO TABS: 25.0000 mg | ORAL_TABLET | Freq: Two times a day (BID) | ORAL | 2 refills | Status: DC

## 2020-01-15 NOTE — Telephone Encounter (Addendum)
Received BMET results from [facility] drawn yesterday at [facility] screening / baseline visit. Patient's K+ is elevated at 6.0. Cala Bradford reviewed this with Dr. Shirlee Latch and he ordered a redraw.   I called patient and discussed this with him. He is not taking any K+ supplements, only takes Spironolactone. He hasn't eaten any foods that are high in K+. He will hold his spironolactone and come in tomorrow, 01-16-20 at 11:30 am, for a redraw. He voiced understanding and agreement of this plan.

## 2020-01-16 ENCOUNTER — Telehealth (HOSPITAL_COMMUNITY): Payer: Self-pay | Admitting: *Deleted

## 2020-01-16 ENCOUNTER — Ambulatory Visit (HOSPITAL_COMMUNITY)
Admission: RE | Admit: 2020-01-16 | Discharge: 2020-01-16 | Disposition: A | Discharge status: Home / Self Care | Payer: BC Managed Care – PPO | Source: Ambulatory Visit | Attending: Internal Medicine | Admitting: Internal Medicine

## 2020-01-16 ENCOUNTER — Other Ambulatory Visit: Payer: Self-pay

## 2020-01-16 ENCOUNTER — Other Ambulatory Visit (HOSPITAL_COMMUNITY): Payer: Self-pay

## 2020-01-16 DIAGNOSIS — Z006 Encounter for examination for normal comparison and control in clinical research program: Secondary | ICD-10-CM | POA: Diagnosis not present

## 2020-01-16 DIAGNOSIS — I5022 Chronic systolic (congestive) heart failure: Secondary | ICD-10-CM | POA: Diagnosis not present

## 2020-01-16 LAB — BASIC METABOLIC PANEL
Anion gap: 8 (ref 5–15)
BUN: 24 mg/dL — ABNORMAL HIGH (ref 8–23)
CO2: 22 mmol/L (ref 22–32)
Calcium: 8.9 mg/dL (ref 8.9–10.3)
Chloride: 106 mmol/L (ref 98–111)
Creatinine, Ser: 1.2 mg/dL (ref 0.61–1.24)
GFR calc Af Amer: >60 mL/min (ref >60)
GFR calc non Af Amer: >60 mL/min (ref >60)
Glucose, Bld: 112 mg/dL — ABNORMAL HIGH (ref 70–99)
Potassium: 5.4 mmol/L — ABNORMAL HIGH (ref 3.5–5.1)
Sodium: 136 mmol/L (ref 135–145)

## 2020-01-16 MED ORDER — LOKELMA 5 G PO PACK: 5.0000 g | PACK | Freq: Every day | ORAL | 0 refills | Status: DC

## 2020-01-16 NOTE — Telephone Encounter (Signed)
-----   Message from Laurey Morale, MD sent at 01/16/2020  2:12 PM EST ----- High K looks real.  Would like to get him back on spironolactone.  Have him take Lokelma 5 g daily to decrease his K, then restart spironolactone the next day.  Repeat BMET next Wed or Thurs.  May need Lauren to help with [facility].

## 2020-01-16 NOTE — Progress Notes (Signed)
The copay for [facility] is $40.00. 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

## 2020-01-16 NOTE — Telephone Encounter (Signed)
Shawn Webster, [place]  01/16/2020 4:04 PM EST    Spoke with patient he is aware and agreeable with plan. He does not want to pick up a copay card he said he will pay the $40. Lab appt scheduled. Lokelma sent to [facility] on [address].    Marisa Hua, RN  01/16/2020 3:09 PM EST    LM for patient to return call for results    Evon Slack, RPH-CPP  01/16/2020 2:31 PM EST    The copay for Lokelma is $40.00. 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    Laurey Morale, MD  01/16/2020 2:12 PM EST    High K looks real. Would like to get him back on spironolactone. Have him take Lokelma 5 g daily to decrease his K, then restart spironolactone the next day. Repeat BMET next Wed or Thurs. May need Lauren to help with [facility].

## 2020-01-20 ENCOUNTER — Ambulatory Visit (HOSPITAL_COMMUNITY)
Admission: RE | Admit: 2020-01-20 | Discharge: 2020-01-20 | Disposition: A | Discharge status: Home / Self Care | Payer: BC Managed Care – PPO | Source: Ambulatory Visit | Attending: Internal Medicine | Admitting: Internal Medicine

## 2020-01-20 ENCOUNTER — Ambulatory Visit (HOSPITAL_BASED_OUTPATIENT_CLINIC_OR_DEPARTMENT_OTHER)
Admission: RE | Admit: 2020-01-20 | Discharge: 2020-01-20 | Disposition: A | Discharge status: Home / Self Care | Payer: BC Managed Care – PPO | Source: Ambulatory Visit | Attending: Internal Medicine | Admitting: Internal Medicine

## 2020-01-20 ENCOUNTER — Other Ambulatory Visit: Payer: Self-pay

## 2020-01-20 DIAGNOSIS — Z006 Encounter for examination for normal comparison and control in clinical research program: Secondary | ICD-10-CM

## 2020-01-20 DIAGNOSIS — I5022 Chronic systolic (congestive) heart failure: Secondary | ICD-10-CM

## 2020-01-20 NOTE — Progress Notes (Signed)
  Echocardiogram 2D Echocardiogram has been performed.  Celene Skeen 01/20/2020, 2:32 PM

## 2020-01-21 NOTE — Research (Signed)
Carotid and echo completed as of 01/20/2020. EF is now 40-45%  Patient is considered a screen fail.

## 2020-01-23 ENCOUNTER — Encounter: Payer: Self-pay | Admitting: *Deleted

## 2020-01-23 ENCOUNTER — Ambulatory Visit (HOSPITAL_COMMUNITY)
Admission: RE | Admit: 2020-01-23 | Discharge: 2020-01-23 | Disposition: A | Discharge status: Home / Self Care | Payer: BC Managed Care – PPO | Source: Ambulatory Visit | Attending: Internal Medicine | Admitting: Internal Medicine

## 2020-01-23 ENCOUNTER — Other Ambulatory Visit: Payer: Self-pay

## 2020-01-23 DIAGNOSIS — Z006 Encounter for examination for normal comparison and control in clinical research program: Secondary | ICD-10-CM

## 2020-01-23 DIAGNOSIS — I5022 Chronic systolic (congestive) heart failure: Secondary | ICD-10-CM | POA: Diagnosis not present

## 2020-01-23 LAB — BASIC METABOLIC PANEL
Anion gap: 9 (ref 5–15)
BUN: 15 mg/dL (ref 8–23)
CO2: 25 mmol/L (ref 22–32)
Calcium: 8.9 mg/dL (ref 8.9–10.3)
Chloride: 105 mmol/L (ref 98–111)
Creatinine, Ser: 1.1 mg/dL (ref 0.61–1.24)
GFR calc Af Amer: >60 mL/min (ref >60)
GFR calc non Af Amer: >60 mL/min (ref >60)
Glucose, Bld: 107 mg/dL — ABNORMAL HIGH (ref 70–99)
Potassium: 5 mmol/L (ref 3.5–5.1)
Sodium: 139 mmol/L (ref 135–145)

## 2020-01-23 NOTE — Research (Signed)
Called patient today about results for batwire.  His EF had improved to 40-45%.  His carotids were 1-39% with normal flow.  Patient was excited to hear that his EF had improved. I told him that with the EF going up it would exclude him from the study, he was bummed but happy over all.  Thanked him for wanting to do the study.

## 2020-01-27 NOTE — Progress Notes (Signed)
Patient aware of results.

## 2020-02-04 ENCOUNTER — Ambulatory Visit (INDEPENDENT_AMBULATORY_CARE_PROVIDER_SITE_OTHER): Payer: BC Managed Care – PPO | Admitting: *Deleted

## 2020-02-04 DIAGNOSIS — I5022 Chronic systolic (congestive) heart failure: Secondary | ICD-10-CM | POA: Diagnosis not present

## 2020-02-04 LAB — CUP PACEART REMOTE DEVICE CHECK
Battery Remaining Longevity: 156 mo
Battery Remaining Percentage: 100 %
Brady Statistic RA Percent Paced: 1 %
Brady Statistic RV Percent Paced: 3 %
Date Time Interrogation Session: 20210316025500
HighPow Impedance: 67 Ohm
Implantable Lead Implant Date: 20190910
Implantable Lead Implant Date: 20190910
Implantable Lead Location: 753859
Implantable Lead Location: 753860
Implantable Lead Model: 293
Implantable Lead Model: 7741
Implantable Lead Serial Number: 1054233
Implantable Lead Serial Number: 440947
Implantable Pulse Generator Implant Date: 20190910
Lead Channel Impedance Value: 419 Ohm
Lead Channel Impedance Value: 628 Ohm
Lead Channel Setting Pacing Amplitude: 2.5 V
Lead Channel Setting Pacing Amplitude: 2.5 V
Lead Channel Setting Pacing Pulse Width: 0.4 ms
Lead Channel Setting Sensing Sensitivity: 0.5 mV
Pulse Gen Serial Number: 550897

## 2020-02-04 NOTE — Progress Notes (Signed)
ICD Remote  

## 2020-02-14 ENCOUNTER — Other Ambulatory Visit: Payer: Self-pay

## 2020-02-14 ENCOUNTER — Ambulatory Visit (INDEPENDENT_AMBULATORY_CARE_PROVIDER_SITE_OTHER): Payer: BC Managed Care – PPO | Admitting: *Deleted

## 2020-02-14 DIAGNOSIS — Z5181 Encounter for therapeutic drug level monitoring: Secondary | ICD-10-CM | POA: Diagnosis not present

## 2020-02-14 DIAGNOSIS — I513 Intracardiac thrombosis, not elsewhere classified: Secondary | ICD-10-CM | POA: Diagnosis not present

## 2020-02-14 DIAGNOSIS — I240 Acute coronary thrombosis not resulting in myocardial infarction: Secondary | ICD-10-CM | POA: Diagnosis not present

## 2020-02-14 LAB — POCT INR: INR: 2.4 (ref 2.0–3.0)

## 2020-02-14 NOTE — Patient Instructions (Signed)
Description   Continue taking 1/2 tablet daily except 1 tablet on Mondays. Continue eating about 1 serving of dark green leafy veggies weekly. Recheck in 4 weeks. Call with any new medications or concerns, [phone]

## 2020-03-11 ENCOUNTER — Other Ambulatory Visit: Payer: Self-pay

## 2020-03-11 ENCOUNTER — Ambulatory Visit (INDEPENDENT_AMBULATORY_CARE_PROVIDER_SITE_OTHER): Payer: BC Managed Care – PPO

## 2020-03-11 DIAGNOSIS — I240 Acute coronary thrombosis not resulting in myocardial infarction: Secondary | ICD-10-CM | POA: Diagnosis not present

## 2020-03-11 DIAGNOSIS — Z5181 Encounter for therapeutic drug level monitoring: Secondary | ICD-10-CM | POA: Diagnosis not present

## 2020-03-11 DIAGNOSIS — I513 Intracardiac thrombosis, not elsewhere classified: Secondary | ICD-10-CM

## 2020-03-11 LAB — POCT INR: INR: 3.1 — AB (ref 2.0–3.0)

## 2020-03-11 NOTE — Patient Instructions (Signed)
Description   Start taking 1/2 tablet daily. Recheck in 4 weeks. Call with any new medications or concerns, [phone]

## 2020-03-12 ENCOUNTER — Other Ambulatory Visit (HOSPITAL_COMMUNITY): Payer: Self-pay

## 2020-03-12 MED ORDER — ENTRESTO 97-103 MG PO TABS: 1.0000 | ORAL_TABLET | Freq: Two times a day (BID) | ORAL | 5 refills | Status: DC

## 2020-03-12 MED ORDER — SPIRONOLACTONE 25 MG PO TABS: ORAL_TABLET | ORAL | 5 refills | Status: DC

## 2020-04-08 ENCOUNTER — Ambulatory Visit (INDEPENDENT_AMBULATORY_CARE_PROVIDER_SITE_OTHER): Payer: BC Managed Care – PPO | Admitting: *Deleted

## 2020-04-08 ENCOUNTER — Other Ambulatory Visit: Payer: Self-pay

## 2020-04-08 DIAGNOSIS — I240 Acute coronary thrombosis not resulting in myocardial infarction: Secondary | ICD-10-CM

## 2020-04-08 DIAGNOSIS — I513 Intracardiac thrombosis, not elsewhere classified: Secondary | ICD-10-CM

## 2020-04-08 DIAGNOSIS — Z5181 Encounter for therapeutic drug level monitoring: Secondary | ICD-10-CM | POA: Diagnosis not present

## 2020-04-08 LAB — POCT INR: INR: 1.5 — AB (ref 2.0–3.0)

## 2020-04-08 NOTE — Patient Instructions (Signed)
Description   Today and tomorrow 1 tablet then continue taking 1/2 tablet daily. Recheck in 2 weeks. Call with any new medications or concerns, [phone]

## 2020-04-10 ENCOUNTER — Other Ambulatory Visit (HOSPITAL_COMMUNITY): Payer: Self-pay

## 2020-04-10 MED ORDER — CARVEDILOL 25 MG PO TABS: 25.0000 mg | ORAL_TABLET | Freq: Two times a day (BID) | ORAL | 2 refills | Status: DC

## 2020-04-22 ENCOUNTER — Ambulatory Visit (INDEPENDENT_AMBULATORY_CARE_PROVIDER_SITE_OTHER): Payer: BC Managed Care – PPO | Admitting: *Deleted

## 2020-04-22 ENCOUNTER — Other Ambulatory Visit: Payer: Self-pay

## 2020-04-22 DIAGNOSIS — I513 Intracardiac thrombosis, not elsewhere classified: Secondary | ICD-10-CM | POA: Diagnosis not present

## 2020-04-22 DIAGNOSIS — Z5181 Encounter for therapeutic drug level monitoring: Secondary | ICD-10-CM

## 2020-04-22 DIAGNOSIS — I240 Acute coronary thrombosis not resulting in myocardial infarction: Secondary | ICD-10-CM

## 2020-04-22 LAB — POCT INR: INR: 2.2 (ref 2.0–3.0)

## 2020-04-22 NOTE — Patient Instructions (Signed)
Description   Continue taking Warfarin 1/2 tablet daily. Recheck in 4 weeks. Call with any new medications or concerns, [phone]

## 2020-05-05 ENCOUNTER — Other Ambulatory Visit (HOSPITAL_COMMUNITY): Payer: Self-pay | Admitting: Cardiology

## 2020-05-05 ENCOUNTER — Ambulatory Visit (INDEPENDENT_AMBULATORY_CARE_PROVIDER_SITE_OTHER): Payer: BC Managed Care – PPO | Admitting: *Deleted

## 2020-05-05 DIAGNOSIS — I259 Chronic ischemic heart disease, unspecified: Secondary | ICD-10-CM

## 2020-05-05 LAB — CUP PACEART REMOTE DEVICE CHECK
Battery Remaining Longevity: 156 mo
Battery Remaining Percentage: 100 %
Brady Statistic RA Percent Paced: 1 %
Brady Statistic RV Percent Paced: 2 %
Date Time Interrogation Session: 20210615024200
HighPow Impedance: 59 Ohm
Implantable Lead Implant Date: 20190910
Implantable Lead Implant Date: 20190910
Implantable Lead Location: 753859
Implantable Lead Location: 753860
Implantable Lead Model: 293
Implantable Lead Model: 7741
Implantable Lead Serial Number: 1054233
Implantable Lead Serial Number: 440947
Implantable Pulse Generator Implant Date: 20190910
Lead Channel Impedance Value: 397 Ohm
Lead Channel Impedance Value: 719 Ohm
Lead Channel Setting Pacing Amplitude: 2.5 V
Lead Channel Setting Pacing Amplitude: 2.5 V
Lead Channel Setting Pacing Pulse Width: 0.4 ms
Lead Channel Setting Sensing Sensitivity: 0.5 mV
Pulse Gen Serial Number: 550897

## 2020-05-05 MED ORDER — DAPAGLIFLOZIN PROPANEDIOL 10 MG PO TABS: 10.0000 mg | ORAL_TABLET | Freq: Every day | ORAL | 0 refills | Status: DC

## 2020-05-06 NOTE — Progress Notes (Signed)
Remote ICD transmission.   

## 2020-05-20 ENCOUNTER — Ambulatory Visit (INDEPENDENT_AMBULATORY_CARE_PROVIDER_SITE_OTHER): Payer: BC Managed Care – PPO

## 2020-05-20 ENCOUNTER — Other Ambulatory Visit: Payer: Self-pay

## 2020-05-20 DIAGNOSIS — Z5181 Encounter for therapeutic drug level monitoring: Secondary | ICD-10-CM | POA: Diagnosis not present

## 2020-05-20 DIAGNOSIS — I240 Acute coronary thrombosis not resulting in myocardial infarction: Secondary | ICD-10-CM

## 2020-05-20 DIAGNOSIS — I513 Intracardiac thrombosis, not elsewhere classified: Secondary | ICD-10-CM

## 2020-05-20 LAB — POCT INR: INR: 1.9 — AB (ref 2.0–3.0)

## 2020-05-20 NOTE — Patient Instructions (Signed)
Description   Take 1 tablet today, then resume same dosage 1/2 tablet daily. Recheck in 4 weeks. Call with any new medications or concerns, [phone]

## 2020-06-17 ENCOUNTER — Other Ambulatory Visit: Payer: Self-pay

## 2020-06-17 ENCOUNTER — Ambulatory Visit (INDEPENDENT_AMBULATORY_CARE_PROVIDER_SITE_OTHER): Payer: BC Managed Care – PPO | Admitting: *Deleted

## 2020-06-17 DIAGNOSIS — I513 Intracardiac thrombosis, not elsewhere classified: Secondary | ICD-10-CM

## 2020-06-17 DIAGNOSIS — Z5181 Encounter for therapeutic drug level monitoring: Secondary | ICD-10-CM | POA: Diagnosis not present

## 2020-06-17 DIAGNOSIS — I240 Acute coronary thrombosis not resulting in myocardial infarction: Secondary | ICD-10-CM | POA: Diagnosis not present

## 2020-06-17 LAB — POCT INR: INR: 2.3 (ref 2.0–3.0)

## 2020-06-17 NOTE — Patient Instructions (Signed)
Description   Continue taking Warfarin 1/2 tablet daily. Recheck in 5 weeks. Call with any new medications or concerns, [phone]

## 2020-06-19 ENCOUNTER — Other Ambulatory Visit (HOSPITAL_COMMUNITY): Payer: Self-pay | Admitting: *Deleted

## 2020-06-19 MED ORDER — DAPAGLIFLOZIN PROPANEDIOL 10 MG PO TABS: 10.0000 mg | ORAL_TABLET | Freq: Every day | ORAL | 3 refills | Status: DC

## 2020-07-09 ENCOUNTER — Other Ambulatory Visit (HOSPITAL_COMMUNITY): Payer: Self-pay | Admitting: Cardiology

## 2020-07-22 ENCOUNTER — Other Ambulatory Visit: Payer: Self-pay

## 2020-07-22 ENCOUNTER — Ambulatory Visit (INDEPENDENT_AMBULATORY_CARE_PROVIDER_SITE_OTHER): Payer: BC Managed Care – PPO | Admitting: *Deleted

## 2020-07-22 DIAGNOSIS — I240 Acute coronary thrombosis not resulting in myocardial infarction: Secondary | ICD-10-CM | POA: Diagnosis not present

## 2020-07-22 DIAGNOSIS — Z5181 Encounter for therapeutic drug level monitoring: Secondary | ICD-10-CM

## 2020-07-22 DIAGNOSIS — I513 Intracardiac thrombosis, not elsewhere classified: Secondary | ICD-10-CM | POA: Diagnosis not present

## 2020-07-22 LAB — POCT INR: INR: 2.1 (ref 2.0–3.0)

## 2020-07-22 NOTE — Patient Instructions (Signed)
Description   Continue taking Warfarin 1/2 tablet daily. Recheck in 6 weeks. Call with any new medications or concerns, [phone]

## 2020-08-04 ENCOUNTER — Ambulatory Visit (INDEPENDENT_AMBULATORY_CARE_PROVIDER_SITE_OTHER): Payer: BC Managed Care – PPO | Admitting: *Deleted

## 2020-08-04 DIAGNOSIS — Z95810 Presence of automatic (implantable) cardiac defibrillator: Secondary | ICD-10-CM

## 2020-08-04 LAB — CUP PACEART REMOTE DEVICE CHECK
Battery Remaining Longevity: 156 mo
Battery Remaining Percentage: 100 %
Brady Statistic RA Percent Paced: 1 %
Brady Statistic RV Percent Paced: 2 %
Date Time Interrogation Session: 20210914024100
HighPow Impedance: 60 Ohm
Implantable Lead Implant Date: 20190910
Implantable Lead Implant Date: 20190910
Implantable Lead Location: 753859
Implantable Lead Location: 753860
Implantable Lead Model: 293
Implantable Lead Model: 7741
Implantable Lead Serial Number: 1054233
Implantable Lead Serial Number: 440947
Implantable Pulse Generator Implant Date: 20190910
Lead Channel Impedance Value: 406 Ohm
Lead Channel Impedance Value: 698 Ohm
Lead Channel Setting Pacing Amplitude: 2.5 V
Lead Channel Setting Pacing Amplitude: 2.5 V
Lead Channel Setting Pacing Pulse Width: 0.4 ms
Lead Channel Setting Sensing Sensitivity: 0.5 mV
Pulse Gen Serial Number: 550897

## 2020-08-06 NOTE — Progress Notes (Signed)
Remote ICD transmission.   

## 2020-09-02 ENCOUNTER — Ambulatory Visit (INDEPENDENT_AMBULATORY_CARE_PROVIDER_SITE_OTHER): Payer: BC Managed Care – PPO | Admitting: *Deleted

## 2020-09-02 ENCOUNTER — Other Ambulatory Visit: Payer: Self-pay

## 2020-09-02 DIAGNOSIS — I240 Acute coronary thrombosis not resulting in myocardial infarction: Secondary | ICD-10-CM | POA: Diagnosis not present

## 2020-09-02 DIAGNOSIS — Z5181 Encounter for therapeutic drug level monitoring: Secondary | ICD-10-CM | POA: Diagnosis not present

## 2020-09-02 DIAGNOSIS — I513 Intracardiac thrombosis, not elsewhere classified: Secondary | ICD-10-CM | POA: Diagnosis not present

## 2020-09-02 LAB — POCT INR: INR: 1.8 — AB (ref 2.0–3.0)

## 2020-09-02 MED ORDER — WARFARIN SODIUM 5 MG PO TABS: 5.0000 mg | ORAL_TABLET | Freq: Every day | ORAL | 0 refills | Status: DC

## 2020-09-02 NOTE — Progress Notes (Signed)
Pt stated that he needed a refill , he has about 10 day supply left of his warfarin. He currently has warfarin 10 mg tablet strength.   Informed pt that I would send in a refill for warfarin 5mg  tablets and that they will be a peach color.   Informed pt that he could finish using his 10mg  tablet of warfarin ( 1/2 a tablet daily= 5mg ) and then he when he starts using his 5mg  tablets he will be taking (1 tablet daily= 5mg ).  Reviewed printed calender with pt. Pt verbalized understanding on how to take his warfarin.

## 2020-09-02 NOTE — Patient Instructions (Signed)
Description    Take 1 tablet today (10mg ) and then continue to take 5mg  daily.  Recheck INR in 3 weeks.   Finish using you 10mg  tablets of warfarin and then start taking your 5mg  tablets of warfarin. Call coumadin clinic for changes in medications or up coming procedures.

## 2020-09-07 ENCOUNTER — Other Ambulatory Visit (HOSPITAL_COMMUNITY): Payer: Self-pay | Admitting: Cardiology

## 2020-09-10 DIAGNOSIS — Z23 Encounter for immunization: Secondary | ICD-10-CM | POA: Diagnosis not present

## 2020-10-02 ENCOUNTER — Ambulatory Visit (INDEPENDENT_AMBULATORY_CARE_PROVIDER_SITE_OTHER): Payer: BC Managed Care – PPO | Admitting: *Deleted

## 2020-10-02 ENCOUNTER — Other Ambulatory Visit: Payer: Self-pay

## 2020-10-02 DIAGNOSIS — Z5181 Encounter for therapeutic drug level monitoring: Secondary | ICD-10-CM

## 2020-10-02 DIAGNOSIS — I240 Acute coronary thrombosis not resulting in myocardial infarction: Secondary | ICD-10-CM

## 2020-10-02 DIAGNOSIS — I513 Intracardiac thrombosis, not elsewhere classified: Secondary | ICD-10-CM

## 2020-10-02 LAB — POCT INR: INR: 1.4 — AB (ref 2.0–3.0)

## 2020-10-02 NOTE — Patient Instructions (Addendum)
Description   Today and tomorrow take 7.5mg  (1.5 tablets of 5mg ) then start taking 5mg  daily except Tuesdays take 7.5mg  (1.5 tablets of 5mg ).  Recheck INR in 1 week. [phone] Call coumadin clinic for changes in medications or up coming procedures.

## 2020-10-05 ENCOUNTER — Other Ambulatory Visit (HOSPITAL_COMMUNITY): Payer: Self-pay | Admitting: Cardiology

## 2020-10-07 ENCOUNTER — Other Ambulatory Visit (HOSPITAL_COMMUNITY): Payer: Self-pay | Admitting: Cardiology

## 2020-10-09 ENCOUNTER — Ambulatory Visit (INDEPENDENT_AMBULATORY_CARE_PROVIDER_SITE_OTHER): Payer: BC Managed Care – PPO

## 2020-10-09 ENCOUNTER — Other Ambulatory Visit: Payer: Self-pay

## 2020-10-09 DIAGNOSIS — I240 Acute coronary thrombosis not resulting in myocardial infarction: Secondary | ICD-10-CM

## 2020-10-09 DIAGNOSIS — Z5181 Encounter for therapeutic drug level monitoring: Secondary | ICD-10-CM

## 2020-10-09 DIAGNOSIS — I513 Intracardiac thrombosis, not elsewhere classified: Secondary | ICD-10-CM | POA: Diagnosis not present

## 2020-10-09 LAB — POCT INR: INR: 2 (ref 2.0–3.0)

## 2020-10-09 NOTE — Patient Instructions (Signed)
Description   Start taking 5mg  daily except Tuesdays and Fridays take 7.5mg  (1.5 tablets of 5mg ).  Recheck INR in 2 weeks. [phone] Call coumadin clinic for changes in medications or up coming procedures.

## 2020-10-21 ENCOUNTER — Other Ambulatory Visit (HOSPITAL_COMMUNITY): Payer: Self-pay | Admitting: Cardiology

## 2020-10-22 ENCOUNTER — Other Ambulatory Visit (HOSPITAL_COMMUNITY): Payer: Self-pay | Admitting: *Deleted

## 2020-10-22 MED ORDER — CARVEDILOL 25 MG PO TABS: 25.0000 mg | ORAL_TABLET | Freq: Two times a day (BID) | ORAL | 2 refills | Status: DC

## 2020-10-23 ENCOUNTER — Ambulatory Visit (INDEPENDENT_AMBULATORY_CARE_PROVIDER_SITE_OTHER): Payer: BC Managed Care – PPO | Admitting: *Deleted

## 2020-10-23 ENCOUNTER — Other Ambulatory Visit: Payer: Self-pay

## 2020-10-23 DIAGNOSIS — I240 Acute coronary thrombosis not resulting in myocardial infarction: Secondary | ICD-10-CM | POA: Diagnosis not present

## 2020-10-23 DIAGNOSIS — I513 Intracardiac thrombosis, not elsewhere classified: Secondary | ICD-10-CM | POA: Diagnosis not present

## 2020-10-23 DIAGNOSIS — Z5181 Encounter for therapeutic drug level monitoring: Secondary | ICD-10-CM | POA: Diagnosis not present

## 2020-10-23 LAB — POCT INR: INR: 2.5 (ref 2.0–3.0)

## 2020-10-23 NOTE — Patient Instructions (Signed)
Description   Continue taking Warfarin 5mg  daily except Tuesdays and Fridays take 7.5mg  (1.5 tablets of 5mg ).  Recheck INR in 2 weeks. [phone] Call coumadin clinic for changes in medications or up coming procedures.

## 2020-11-03 ENCOUNTER — Ambulatory Visit (INDEPENDENT_AMBULATORY_CARE_PROVIDER_SITE_OTHER): Payer: BC Managed Care – PPO

## 2020-11-03 DIAGNOSIS — Z95810 Presence of automatic (implantable) cardiac defibrillator: Secondary | ICD-10-CM | POA: Diagnosis not present

## 2020-11-03 LAB — CUP PACEART REMOTE DEVICE CHECK
Battery Remaining Longevity: 156 mo
Battery Remaining Percentage: 100 %
Brady Statistic RA Percent Paced: 1 %
Brady Statistic RV Percent Paced: 2 %
Date Time Interrogation Session: 20211214025400
HighPow Impedance: 64 Ohm
Implantable Lead Implant Date: 20190910
Implantable Lead Implant Date: 20190910
Implantable Lead Location: 753859
Implantable Lead Location: 753860
Implantable Lead Model: 293
Implantable Lead Model: 7741
Implantable Lead Serial Number: 1054233
Implantable Lead Serial Number: 440947
Implantable Pulse Generator Implant Date: 20190910
Lead Channel Impedance Value: 397 Ohm
Lead Channel Impedance Value: 731 Ohm
Lead Channel Setting Pacing Amplitude: 2.5 V
Lead Channel Setting Pacing Amplitude: 2.5 V
Lead Channel Setting Pacing Pulse Width: 0.4 ms
Lead Channel Setting Sensing Sensitivity: 0.5 mV
Pulse Gen Serial Number: 550897

## 2020-11-06 ENCOUNTER — Other Ambulatory Visit (HOSPITAL_COMMUNITY): Payer: Self-pay | Admitting: Cardiology

## 2020-11-17 ENCOUNTER — Other Ambulatory Visit: Payer: Self-pay

## 2020-11-17 ENCOUNTER — Ambulatory Visit (INDEPENDENT_AMBULATORY_CARE_PROVIDER_SITE_OTHER): Payer: BC Managed Care – PPO

## 2020-11-17 DIAGNOSIS — Z5181 Encounter for therapeutic drug level monitoring: Secondary | ICD-10-CM | POA: Diagnosis not present

## 2020-11-17 DIAGNOSIS — I240 Acute coronary thrombosis not resulting in myocardial infarction: Secondary | ICD-10-CM

## 2020-11-17 DIAGNOSIS — I513 Intracardiac thrombosis, not elsewhere classified: Secondary | ICD-10-CM | POA: Diagnosis not present

## 2020-11-17 LAB — POCT INR: INR: 1.9 — AB (ref 2.0–3.0)

## 2020-11-17 NOTE — Patient Instructions (Signed)
Description   Take 2 tablets today, then resume same dosage of Warfarin 5mg  daily except 7.5mg  on Tuesdays and Fridays.  Recheck INR in 3 weeks. [phone] Call coumadin clinic for changes in medications or up coming procedures.

## 2020-11-18 NOTE — Progress Notes (Signed)
Remote ICD transmission.   

## 2020-11-27 DIAGNOSIS — E7800 Pure hypercholesterolemia, unspecified: Secondary | ICD-10-CM | POA: Diagnosis not present

## 2020-11-27 DIAGNOSIS — Z125 Encounter for screening for malignant neoplasm of prostate: Secondary | ICD-10-CM | POA: Diagnosis not present

## 2020-11-27 DIAGNOSIS — E1169 Type 2 diabetes mellitus with other specified complication: Secondary | ICD-10-CM | POA: Diagnosis not present

## 2020-11-27 DIAGNOSIS — I2510 Atherosclerotic heart disease of native coronary artery without angina pectoris: Secondary | ICD-10-CM | POA: Diagnosis not present

## 2020-11-27 DIAGNOSIS — Z0000 Encounter for general adult medical examination without abnormal findings: Secondary | ICD-10-CM | POA: Diagnosis not present

## 2020-12-04 ENCOUNTER — Other Ambulatory Visit: Payer: Self-pay

## 2020-12-04 ENCOUNTER — Ambulatory Visit (HOSPITAL_COMMUNITY)
Admission: RE | Admit: 2020-12-04 | Discharge: 2020-12-04 | Disposition: A | Discharge status: Home / Self Care | Payer: BC Managed Care – PPO | Source: Ambulatory Visit | Attending: Cardiology | Admitting: Cardiology

## 2020-12-04 ENCOUNTER — Encounter (HOSPITAL_COMMUNITY): Payer: Self-pay | Admitting: Cardiology

## 2020-12-04 VITALS — BP 120/90 | HR 68 | Ht 73.0 in | Wt 196.4 lb

## 2020-12-04 DIAGNOSIS — Z87891 Personal history of nicotine dependence: Secondary | ICD-10-CM | POA: Diagnosis not present

## 2020-12-04 DIAGNOSIS — Z7901 Long term (current) use of anticoagulants: Secondary | ICD-10-CM | POA: Insufficient documentation

## 2020-12-04 DIAGNOSIS — I2510 Atherosclerotic heart disease of native coronary artery without angina pectoris: Secondary | ICD-10-CM | POA: Diagnosis not present

## 2020-12-04 DIAGNOSIS — I255 Ischemic cardiomyopathy: Secondary | ICD-10-CM | POA: Diagnosis not present

## 2020-12-04 DIAGNOSIS — Z95810 Presence of automatic (implantable) cardiac defibrillator: Secondary | ICD-10-CM | POA: Insufficient documentation

## 2020-12-04 DIAGNOSIS — I5022 Chronic systolic (congestive) heart failure: Secondary | ICD-10-CM | POA: Diagnosis not present

## 2020-12-04 DIAGNOSIS — I513 Intracardiac thrombosis, not elsewhere classified: Secondary | ICD-10-CM | POA: Insufficient documentation

## 2020-12-04 DIAGNOSIS — I2582 Chronic total occlusion of coronary artery: Secondary | ICD-10-CM | POA: Insufficient documentation

## 2020-12-04 DIAGNOSIS — Z79899 Other long term (current) drug therapy: Secondary | ICD-10-CM | POA: Diagnosis not present

## 2020-12-04 DIAGNOSIS — I110 Hypertensive heart disease with heart failure: Secondary | ICD-10-CM | POA: Insufficient documentation

## 2020-12-04 DIAGNOSIS — Z7984 Long term (current) use of oral hypoglycemic drugs: Secondary | ICD-10-CM | POA: Insufficient documentation

## 2020-12-04 DIAGNOSIS — E119 Type 2 diabetes mellitus without complications: Secondary | ICD-10-CM | POA: Insufficient documentation

## 2020-12-04 DIAGNOSIS — Z8249 Family history of ischemic heart disease and other diseases of the circulatory system: Secondary | ICD-10-CM | POA: Insufficient documentation

## 2020-12-04 LAB — LIPID PANEL
Cholesterol: 129 mg/dL (ref 0–200)
HDL: 66 mg/dL (ref >40)
LDL Cholesterol: 54 mg/dL (ref 0–99)
Total CHOL/HDL Ratio: 2 RATIO
Triglycerides: 46 mg/dL (ref <150)
VLDL: 9 mg/dL (ref 0–40)

## 2020-12-04 LAB — CBC
HCT: 42.3 % (ref 39.0–52.0)
Hemoglobin: 14 g/dL (ref 13.0–17.0)
MCH: 30.8 pg (ref 26.0–34.0)
MCHC: 33.1 g/dL (ref 30.0–36.0)
MCV: 93 fL (ref 80.0–100.0)
Platelets: 258 10*3/uL (ref 150–400)
RBC: 4.55 MIL/uL (ref 4.22–5.81)
RDW: 14.6 % (ref 11.5–15.5)
WBC: 6.4 10*3/uL (ref 4.0–10.5)
nRBC: 0 % (ref 0.0–0.2)

## 2020-12-04 LAB — BASIC METABOLIC PANEL
Anion gap: 8 (ref 5–15)
BUN: 20 mg/dL (ref 8–23)
CO2: 24 mmol/L (ref 22–32)
Calcium: 8.9 mg/dL (ref 8.9–10.3)
Chloride: 105 mmol/L (ref 98–111)
Creatinine, Ser: 1.28 mg/dL — ABNORMAL HIGH (ref 0.61–1.24)
GFR, Estimated: >60 mL/min (ref >60)
Glucose, Bld: 84 mg/dL (ref 70–99)
Potassium: 5.8 mmol/L — ABNORMAL HIGH (ref 3.5–5.1)
Sodium: 137 mmol/L (ref 135–145)

## 2020-12-04 NOTE — Patient Instructions (Signed)
Labs done today. We will contact you only if your labs are abnormal.  No medication changes were made. Please continue all current medications as prescribed.  Your physician recommends that you schedule a follow-up appointment in: 3-4 months with an echo prior to your exam  Your physician has requested that you have an echocardiogram. Echocardiography is a painless test that uses sound waves to create images of your heart. It provides your doctor with information about the size and shape of your heart and how well your heart's chambers and valves are working. This procedure takes approximately one hour. There are no restrictions for this procedure.   If you have any questions or concerns before your next appointment please send [place] a message through [place] or call our office at [phone].    TO LEAVE A MESSAGE FOR THE NURSE SELECT OPTION 2, PLEASE LEAVE A MESSAGE INCLUDING: . YOUR NAME . DATE OF BIRTH . CALL BACK NUMBER . REASON FOR CALL**this is important as we prioritize the call backs  YOU WILL RECEIVE A CALL BACK THE SAME DAY AS LONG AS YOU CALL BEFORE 4:00 PM   Do the following things EVERYDAY: 1) Weigh yourself in the morning before breakfast. Write it down and keep it in a log. 2) Take your medicines as prescribed 3) Eat low salt foods--Limit salt (sodium) to 2000 mg per day.  4) Stay as active as you can everyday 5) Limit all fluids for the day to less than 2 liters   At the [facility], you and your health needs are our priority. As part of our continuing mission to provide you with exceptional heart care, we have created designated Provider Care Teams. These Care Teams include your primary Cardiologist (physician) and Advanced Practice Providers (APPs- Physician Assistants and Nurse Practitioners) who all work together to provide you with the care you need, when you need it.   You may see any of the following providers on your designated Care Team at your  next follow up: Marland Kitchen Dr Arvilla Meres . Dr Marca Ancona . Tonye Becket, NP . Robbie Lis, PA . Karle Plumber, PharmD   Please be sure to bring in all your medications bottles to every appointment.

## 2020-12-04 NOTE — Progress Notes (Signed)
Patient is being seen in the [facility]. VS, EKG, and device interrogations performed in the clinic. Dr McLean is at home and seeing patients via telemedicine as he is in quarantine.   

## 2020-12-06 NOTE — Progress Notes (Signed)
Heart Failure TeleHealth Note  Due to national recommendations of social distancing due to COVID 19, Audio/video telehealth visit is felt to be most appropriate for this patient at this time.  See MyChart message from today for patient consent regarding telehealth for [facility].  Date:  12/06/2020   ID:  Shawn Webster, DOB 1956-04-16, MRN [id number]  Location: Home  Provider location: [place] [facility] Type of Visit: Established patient   PCP:  Renford Dills, MD  Cardiologist:  Marca Ancona, MD   History of Present Illness: Shawn Webster is a 65 y.o. male who presents via audio/video conferencing for a telehealth visit today.     he denies symptoms worrisome for COVID 19.   Patient has a h/o chronic systolic heart failure (08/2017), LV thrombus on coumadin, severe CAD, and former smoker.   Admitted in 10/18 with increased dyspnea and fatigue. ECHO showed severely reduced EF ~20%.  LHC showed severe coronary disease: occluded LAD without collaterals and diffusely diseased LCx.  The LCx territory did not appear viable. Evaluated by Dr Swazil[place] and Dr Laneta Simmers with recommendations for medical management, for the LAD there was no option for CTO revascularization or CABG. Started on HF meds.  [employer] ICD placed.    Echo in 3/20 showed EF 25-30% with moderate LV dilation.  Echo in 3/21 showed EF up to 40-45% with normal RV.   He presents today for followup of CHF and CAD.  Weight is down 33 lbs since last appointment.  He has been doing very well symptomatically.  No chest pain.  No exertional dyspnea. No orthopnea/PND.  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   ECG (personally reviewed): NSR, possible old inferior MI, poor RWP  Labs (10/18): LDL 106 Labs (1/19): K 5.3 => 4.8, creatinine 0.82 Labs (3/19): K 4.7, creatinine 0.83 Labs (4/19): LDL 89 Labs (6/19): K 5.1, creatinine 1.05 Labs (7/19): LDL 50, HDL 51, LFTs  normal Labs (9/19): K 4.6, creatinine 0.97, hgb 13.1 Labs (12/19): K 5.2, creatinine 1.16 Labs (3/21): K 5, creatinine 1.1  PMH: 1. Chronic systolic CHF: Ischemic cardiomyopathy.  - Echo (10/18): EF 20-25%, diffuse hypokinesis, mildly decreased RV systolic function.  - Cardiac MRI (10/18): Moderately dilated LV with mild LVH, EF 19% with wall motion abnormalities, LV thrombus, LGE pattern suggesting inferolateral wall would not be expected to improve with revascularization of the left circumflex. There did appear to be significant viability within the LAD territory with the exception of the apex. - RHC (10/18): mean RA 8, PA 54/23 mean 34, mean PCWP 22, CI 2.62, PVR 2 WU.  - Echo (2/19): EF 30%, wall motion abnormalities, mild LV dilation, chronic-appearing LV thrombus.  - [employer] ICD - Echo (3/20): LV moderately dilated with EF 25-30%, RV normal size/systolic function.  - CPX (9/20): Peak VO2 17.6, VE/VCO2 slope 41, RER 1.16. Moderate to severe HF limitation.  - Echo (3/21): EF 40-45%, normal RV.  2. LV apical thrombus.  3. CAD: LHC (10/18) with totally occluded LAD with minimal collateralization, diffuse severe disease in LCx. Films reviewed by Dr. Laneta Simmers and Dr [place], he was not thought to be candidate for CABG or CTO PCI.   4. Hyperlipidemia 5. HTN 6. H/o Bell's palsy.  7. H/o ETOH abuse 8. Gout   Review of systems complete and found to be negative unless listed in HPI.     Social History   Socioeconomic History  .  Marital status: Married    Spouse name: Not on file  . Number of children: Not on file  . Years of education: Not on file  . Highest education level: Not on file  Occupational History  . Not on file  Tobacco Use  . Smoking status: Former Smoker    Types: Cigars    Quit date: 08/21/2017    Years since quitting: 3.2  . Smokeless tobacco: Never Used  . Tobacco comment: 1 cigar per day  Vaping Use  . Vaping Use: Never used  Substance and Sexual  Activity  . Alcohol use: Yes    Comment: 2 martinis per day  . Drug use: No  . Sexual activity: Not on file  Other Topics Concern  . Not on file  Social History Narrative  . Not on file   Social Determinants of Health   Financial Resource Strain: Not on file  Food Insecurity: Not on file  Transportation Needs: Not on file  Physical Activity: Not on file  Stress: Not on file  Social Connections: Not on file  Intimate Partner Violence: Not on file    Family History  Problem Relation Age of Onset  . Dementia Mother   . CAD Father     Past Medical History:  Diagnosis Date  . Bell's palsy   . CHF (congestive heart failure) (HCC)   . Hypertension     Current Outpatient Medications  Medication Sig Dispense Refill  . carvedilol (COREG) 25 MG tablet Take 1 tablet (25 mg total) by mouth 2 (two) times daily with a meal. Needs appt for further refills 60 tablet 2  . dapagliflozin propanediol (FARXIGA) 10 MG TABS tablet Take 1 tablet (10 mg total) by mouth daily. 30 tablet 3  . glimepiride (AMARYL) 2 MG tablet Take 2 mg by mouth daily with breakfast.    . rosuvastatin (CRESTOR) 40 MG tablet TAKE 1 TABLET BY MOUTH DAILY 90 tablet 3  . sacubitril-valsartan (ENTRESTO) 97-103 MG Take 1 tablet by mouth 2 (two) times daily. NEEDS APPT FOR FUTURE REFILLS 60 tablet 5  . spironolactone (ALDACTONE) 25 MG tablet TAKE 1 TABLET(25 MG) BY MOUTH DAILY. NEEDS APPT FOR FUTURE REFILLS 30 tablet 5  . warfarin (COUMADIN) 5 MG tablet Take 1 tablet (5 mg total) by mouth daily. Start taking warfarin 5mg  tablets after you have finished you current warfarin supply. 100 tablet 0   No current facility-administered medications for this encounter.    Vitals:   12/04/20 1448  BP: 120/90  Pulse: 68  SpO2: 97%  Weight: 89.1 kg (196 lb 6.4 oz)  Height: 6\' 1"  (1.854 m)   Wt Readings from Last 3 Encounters:  12/04/20 89.1 kg (196 lb 6.4 oz)  01/14/20 94.1 kg (207 lb 6.4 oz)  10/28/19 96.2 kg (212 lb)    PHYSICAL EXAM: Exam:  (Video/Tele Health Call; Exam is subjective and or/visual.) General:  Speaks in full sentences. No resp difficulty. Lungs: Normal respiratory effort with conversation.  Abdomen: Non-distended per patient report Extremities: Pt denies edema. Neuro: Alert & oriented x 3.   ASSESSMENT & PLAN: 1. Chronic Systolic Heart Failure: Echo 08/2017 ECHO 20-25%. Ischemic cardiomyopathy and ETOH may be both have played a role. CPX in 9/20 with moderate-severe HF limitation. Echo in 3/21 showed EF up to 40-45%.  He has a 10/20 ICD.  NYHA class II symptoms.  He is not volume overloaded and weight is down.  - Continue to use Lasix prn.  - Continue Coreg  25 mg bid.    - Continue Entresto 97/103 bid. BMET today.  - Continue spironolactone 25 mg daily.  - QRS narrow on ECG so no role for CRT.   - Continue dapagliflozin 10 mg daily.  - Echo at followup in 3-4 months. 2. CAD: Severe 2 vessel disease on 10/18 cath. Diffuse, severely diseased LCx system and chronic total occlusion proximal LAD.  Cardiac MRI showed inferolateral aneurysm. Inferolateral wall did not appear viable so suspect intervention on LCx would not be helpful. There did appear to be viability in the anterior and anteroseptal walls, the true apex was nonviable.  I discussed with Dr. Swaz[place] and Dr. Laneta Simmers during his 10/18 admission: not candidate for CTO of LAD or CABG. No chest pain.  - Continue Crestor 40 mg daily, check lipids today.   - Given use of warfarin, not taking ASA.  3. LV Thrombus: Continue warfarin.  4. Smoking: He has quit.  5. ETOH: Minimal now.  6. Type II diabetes: On dapagliflozin.   COVID screen The patient does not have any symptoms that suggest any further testing/ screening at this time.  Social distancing reinforced today.  Patient Risk: After full review of this patients clinical status, I feel that they are at moderate risk for cardiac decompensation at this time.  Relevant  cardiac medications were reviewed at length with the patient today. The patient does not have concerns regarding their medications at this time.   Recommended follow-up:  3-4 months with echo.   Today, I have spent 16 minutes with the patient with telehealth technology discussing the above issues .    Signed, Marca Ancona, MD  12/06/2020  [facility] [address] [facility] [place] [address] [phone] (office) [phone] (fax)

## 2020-12-08 ENCOUNTER — Telehealth (HOSPITAL_COMMUNITY): Payer: Self-pay | Admitting: *Deleted

## 2020-12-08 DIAGNOSIS — I5022 Chronic systolic (congestive) heart failure: Secondary | ICD-10-CM

## 2020-12-08 NOTE — Telephone Encounter (Signed)
Geroge Baseman [place], RN  12/08/2020 11:29 AM EST Back to Top     Pt aware, has stopped Cleda Daub and will come for repeat lab 1/19   Noralee Space, RN  12/07/2020 10:32 AM EST      Left mess on ID VM to stop Cleda Daub and call our office back to schedule repeat labss   Laurey Morale, MD  12/05/2020 9:47 PM EST      Low K diet, stop any K supplement. Repeat K stat, if elevated he will need Veltassa.

## 2020-12-08 NOTE — Telephone Encounter (Signed)
-----   Message from Laurey Morale, MD sent at 12/05/2020  9:47 PM EST ----- Low K diet, stop any K supplement. Repeat K stat, if elevated he will need Veltassa.

## 2020-12-09 ENCOUNTER — Other Ambulatory Visit: Payer: Self-pay

## 2020-12-09 ENCOUNTER — Ambulatory Visit (HOSPITAL_COMMUNITY)
Admission: RE | Admit: 2020-12-09 | Discharge: 2020-12-09 | Disposition: A | Discharge status: Home / Self Care | Payer: BC Managed Care – PPO | Source: Ambulatory Visit | Attending: Cardiology | Admitting: Cardiology

## 2020-12-09 DIAGNOSIS — I5022 Chronic systolic (congestive) heart failure: Secondary | ICD-10-CM | POA: Insufficient documentation

## 2020-12-09 LAB — BASIC METABOLIC PANEL
Anion gap: 8 (ref 5–15)
BUN: 19 mg/dL (ref 8–23)
CO2: 26 mmol/L (ref 22–32)
Calcium: 9.1 mg/dL (ref 8.9–10.3)
Chloride: 103 mmol/L (ref 98–111)
Creatinine, Ser: 1.27 mg/dL — ABNORMAL HIGH (ref 0.61–1.24)
GFR, Estimated: >60 mL/min (ref >60)
Glucose, Bld: 102 mg/dL — ABNORMAL HIGH (ref 70–99)
Potassium: 5 mmol/L (ref 3.5–5.1)
Sodium: 137 mmol/L (ref 135–145)

## 2020-12-10 ENCOUNTER — Ambulatory Visit (INDEPENDENT_AMBULATORY_CARE_PROVIDER_SITE_OTHER): Payer: BC Managed Care – PPO | Admitting: *Deleted

## 2020-12-10 DIAGNOSIS — I513 Intracardiac thrombosis, not elsewhere classified: Secondary | ICD-10-CM

## 2020-12-10 DIAGNOSIS — Z5181 Encounter for therapeutic drug level monitoring: Secondary | ICD-10-CM | POA: Diagnosis not present

## 2020-12-10 DIAGNOSIS — I240 Acute coronary thrombosis not resulting in myocardial infarction: Secondary | ICD-10-CM

## 2020-12-10 LAB — POCT INR: INR: 3.3 — AB (ref 2.0–3.0)

## 2020-12-10 NOTE — Patient Instructions (Addendum)
Description   Do not take any Warfarin today then continue taking Warfarin 5mg  daily except 7.5mg  on Tuesdays and Fridays.  Recheck INR in 3 weeks. [phone] Call coumadin clinic for changes in medications or up coming procedures.

## 2020-12-31 ENCOUNTER — Other Ambulatory Visit: Payer: Self-pay

## 2020-12-31 ENCOUNTER — Ambulatory Visit (INDEPENDENT_AMBULATORY_CARE_PROVIDER_SITE_OTHER): Payer: BC Managed Care – PPO | Admitting: *Deleted

## 2020-12-31 DIAGNOSIS — Z5181 Encounter for therapeutic drug level monitoring: Secondary | ICD-10-CM

## 2020-12-31 DIAGNOSIS — I513 Intracardiac thrombosis, not elsewhere classified: Secondary | ICD-10-CM

## 2020-12-31 DIAGNOSIS — I240 Acute coronary thrombosis not resulting in myocardial infarction: Secondary | ICD-10-CM

## 2020-12-31 LAB — POCT INR: INR: 3.8 — AB (ref 2.0–3.0)

## 2020-12-31 NOTE — Patient Instructions (Signed)
Description   Do not take any Warfarin today then start taking Warfarin 5mg  daily except 7.5mg  on Tuesdays.  Recheck INR in 3 weeks. [phone] Call coumadin clinic for changes in medications or up coming procedures.

## 2021-01-21 ENCOUNTER — Other Ambulatory Visit: Payer: Self-pay

## 2021-01-21 ENCOUNTER — Ambulatory Visit (INDEPENDENT_AMBULATORY_CARE_PROVIDER_SITE_OTHER): Payer: BC Managed Care – PPO

## 2021-01-21 DIAGNOSIS — I513 Intracardiac thrombosis, not elsewhere classified: Secondary | ICD-10-CM | POA: Diagnosis not present

## 2021-01-21 DIAGNOSIS — I240 Acute coronary thrombosis not resulting in myocardial infarction: Secondary | ICD-10-CM | POA: Diagnosis not present

## 2021-01-21 DIAGNOSIS — I509 Heart failure, unspecified: Secondary | ICD-10-CM

## 2021-01-21 DIAGNOSIS — Z5181 Encounter for therapeutic drug level monitoring: Secondary | ICD-10-CM

## 2021-01-21 LAB — POCT INR: INR: 3.1 — AB (ref 2.0–3.0)

## 2021-01-21 NOTE — Patient Instructions (Signed)
Description   Start taking Warfarin 5mg  daily.  Recheck INR in 4 weeks. [phone] Call coumadin clinic for changes in medications or up coming procedures.

## 2021-02-02 ENCOUNTER — Ambulatory Visit (INDEPENDENT_AMBULATORY_CARE_PROVIDER_SITE_OTHER): Payer: BC Managed Care – PPO

## 2021-02-02 DIAGNOSIS — Z95810 Presence of automatic (implantable) cardiac defibrillator: Secondary | ICD-10-CM | POA: Diagnosis not present

## 2021-02-03 LAB — CUP PACEART REMOTE DEVICE CHECK
Battery Remaining Longevity: 150 mo
Battery Remaining Percentage: 100 %
Brady Statistic RA Percent Paced: 1 %
Brady Statistic RV Percent Paced: 2 %
Date Time Interrogation Session: 20220316025400
HighPow Impedance: 71 Ohm
Implantable Lead Implant Date: 20190910
Implantable Lead Implant Date: 20190910
Implantable Lead Location: 753859
Implantable Lead Location: 753860
Implantable Lead Model: 293
Implantable Lead Model: 7741
Implantable Lead Serial Number: 1054233
Implantable Lead Serial Number: 440947
Implantable Pulse Generator Implant Date: 20190910
Lead Channel Impedance Value: 418 Ohm
Lead Channel Impedance Value: 727 Ohm
Lead Channel Setting Pacing Amplitude: 2.5 V
Lead Channel Setting Pacing Amplitude: 2.5 V
Lead Channel Setting Pacing Pulse Width: 0.4 ms
Lead Channel Setting Sensing Sensitivity: 0.5 mV
Pulse Gen Serial Number: 550897

## 2021-02-04 ENCOUNTER — Other Ambulatory Visit (HOSPITAL_COMMUNITY): Payer: Self-pay | Admitting: Cardiology

## 2021-02-10 NOTE — Progress Notes (Signed)
Remote ICD transmission.   

## 2021-03-02 ENCOUNTER — Other Ambulatory Visit: Payer: Self-pay

## 2021-03-02 ENCOUNTER — Ambulatory Visit (INDEPENDENT_AMBULATORY_CARE_PROVIDER_SITE_OTHER): Payer: BC Managed Care – PPO | Admitting: Pharmacist

## 2021-03-02 DIAGNOSIS — I513 Intracardiac thrombosis, not elsewhere classified: Secondary | ICD-10-CM | POA: Diagnosis not present

## 2021-03-02 DIAGNOSIS — I240 Acute coronary thrombosis not resulting in myocardial infarction: Secondary | ICD-10-CM | POA: Diagnosis not present

## 2021-03-02 DIAGNOSIS — Z5181 Encounter for therapeutic drug level monitoring: Secondary | ICD-10-CM | POA: Diagnosis not present

## 2021-03-02 LAB — POCT INR: INR: 1.9 — AB (ref 2.0–3.0)

## 2021-03-02 NOTE — Patient Instructions (Addendum)
  Description   Take 1.5 tablets (7.5mg ) today, then continue taking Warfarin 1 tablet (5mg ) daily. Recheck INR in 3 weeks. [phone] Call coumadin clinic for changes in medications or up coming procedures.

## 2021-03-06 ENCOUNTER — Other Ambulatory Visit (HOSPITAL_COMMUNITY): Payer: Self-pay | Admitting: Cardiology

## 2021-03-23 ENCOUNTER — Other Ambulatory Visit: Payer: Self-pay

## 2021-03-23 ENCOUNTER — Ambulatory Visit (INDEPENDENT_AMBULATORY_CARE_PROVIDER_SITE_OTHER): Payer: BC Managed Care – PPO | Admitting: *Deleted

## 2021-03-23 DIAGNOSIS — Z5181 Encounter for therapeutic drug level monitoring: Secondary | ICD-10-CM | POA: Diagnosis not present

## 2021-03-23 DIAGNOSIS — I513 Intracardiac thrombosis, not elsewhere classified: Secondary | ICD-10-CM

## 2021-03-23 DIAGNOSIS — I240 Acute coronary thrombosis not resulting in myocardial infarction: Secondary | ICD-10-CM

## 2021-03-23 LAB — POCT INR: INR: 1.7 — AB (ref 2.0–3.0)

## 2021-03-23 NOTE — Patient Instructions (Signed)
Description   Take 1.5 tablets (7.5mg ) today, then continue taking Warfarin 1 tablet (5mg ) daily. Recheck INR in 3 weeks. [phone] Call coumadin clinic for changes in medications or up coming procedures.

## 2021-04-05 ENCOUNTER — Ambulatory Visit (HOSPITAL_COMMUNITY)
Admission: RE | Admit: 2021-04-05 | Discharge: 2021-04-05 | Disposition: A | Discharge status: Home / Self Care | Payer: BC Managed Care – PPO | Source: Ambulatory Visit | Attending: Cardiology | Admitting: Cardiology

## 2021-04-05 ENCOUNTER — Other Ambulatory Visit: Payer: Self-pay

## 2021-04-05 ENCOUNTER — Encounter (HOSPITAL_COMMUNITY): Payer: Self-pay | Admitting: Cardiology

## 2021-04-05 ENCOUNTER — Ambulatory Visit (HOSPITAL_BASED_OUTPATIENT_CLINIC_OR_DEPARTMENT_OTHER)
Admission: RE | Admit: 2021-04-05 | Discharge: 2021-04-05 | Disposition: A | Discharge status: Home / Self Care | Payer: BC Managed Care – PPO | Source: Ambulatory Visit

## 2021-04-05 VITALS — BP 90/60 | HR 61 | Wt 183.2 lb

## 2021-04-05 DIAGNOSIS — I255 Ischemic cardiomyopathy: Secondary | ICD-10-CM | POA: Insufficient documentation

## 2021-04-05 DIAGNOSIS — Z7901 Long term (current) use of anticoagulants: Secondary | ICD-10-CM | POA: Insufficient documentation

## 2021-04-05 DIAGNOSIS — I2582 Chronic total occlusion of coronary artery: Secondary | ICD-10-CM | POA: Diagnosis not present

## 2021-04-05 DIAGNOSIS — E119 Type 2 diabetes mellitus without complications: Secondary | ICD-10-CM | POA: Diagnosis not present

## 2021-04-05 DIAGNOSIS — Z79899 Other long term (current) drug therapy: Secondary | ICD-10-CM | POA: Diagnosis not present

## 2021-04-05 DIAGNOSIS — I5022 Chronic systolic (congestive) heart failure: Secondary | ICD-10-CM | POA: Insufficient documentation

## 2021-04-05 DIAGNOSIS — Z7984 Long term (current) use of oral hypoglycemic drugs: Secondary | ICD-10-CM | POA: Diagnosis not present

## 2021-04-05 DIAGNOSIS — Z8249 Family history of ischemic heart disease and other diseases of the circulatory system: Secondary | ICD-10-CM | POA: Diagnosis not present

## 2021-04-05 DIAGNOSIS — I8291 Chronic embolism and thrombosis of unspecified vein: Secondary | ICD-10-CM | POA: Insufficient documentation

## 2021-04-05 DIAGNOSIS — I110 Hypertensive heart disease with heart failure: Secondary | ICD-10-CM | POA: Insufficient documentation

## 2021-04-05 DIAGNOSIS — I2510 Atherosclerotic heart disease of native coronary artery without angina pectoris: Secondary | ICD-10-CM | POA: Insufficient documentation

## 2021-04-05 DIAGNOSIS — Z87891 Personal history of nicotine dependence: Secondary | ICD-10-CM | POA: Insufficient documentation

## 2021-04-05 DIAGNOSIS — E785 Hyperlipidemia, unspecified: Secondary | ICD-10-CM | POA: Diagnosis not present

## 2021-04-05 DIAGNOSIS — Z95810 Presence of automatic (implantable) cardiac defibrillator: Secondary | ICD-10-CM | POA: Diagnosis not present

## 2021-04-05 LAB — BASIC METABOLIC PANEL
Anion gap: 7 (ref 5–15)
BUN: 18 mg/dL (ref 8–23)
CO2: 26 mmol/L (ref 22–32)
Calcium: 9.4 mg/dL (ref 8.9–10.3)
Chloride: 104 mmol/L (ref 98–111)
Creatinine, Ser: 1.18 mg/dL (ref 0.61–1.24)
GFR, Estimated: >60 mL/min (ref >60)
Glucose, Bld: 98 mg/dL (ref 70–99)
Potassium: 5.7 mmol/L — ABNORMAL HIGH (ref 3.5–5.1)
Sodium: 137 mmol/L (ref 135–145)

## 2021-04-05 LAB — ECHOCARDIOGRAM COMPLETE
Area-P 1/2: 3.31 cm2
S' Lateral: 5.4 cm
Single Plane A4C EF: 20.8 %

## 2021-04-05 MED ORDER — WARFARIN SODIUM 5 MG PO TABS: 5.0000 mg | ORAL_TABLET | Freq: Every day | ORAL | 0 refills | Status: DC

## 2021-04-05 NOTE — Progress Notes (Signed)
  Echocardiogram 2D Echocardiogram has been performed.  Shawn Webster 04/05/2021, 11:21 AM

## 2021-04-05 NOTE — Patient Instructions (Signed)
Labs done today, your results will be available in MyChart, we will contact you for abnormal readings.  Your physician recommends that you schedule a follow-up appointment in: 4 months  If you have any questions or concerns before your next appointment please send us a message through mychart or call our office at [phone].    TO LEAVE A MESSAGE FOR THE NURSE SELECT OPTION 2, PLEASE LEAVE A MESSAGE INCLUDING: . YOUR NAME . DATE OF BIRTH . CALL BACK NUMBER . REASON FOR CALL**this is important as we prioritize the call backs  YOU WILL RECEIVE A CALL BACK THE SAME DAY AS LONG AS YOU CALL BEFORE 4:00 PM  At the [facility], you and your health needs are our priority. As part of our continuing mission to provide you with exceptional heart care, we have created designated Provider Care Teams. These Care Teams include your primary Cardiologist (physician) and Advanced Practice Providers (APPs- Physician Assistants and Nurse Practitioners) who all work together to provide you with the care you need, when you need it.   You may see any of the following providers on your designated Care Team at your next follow up: . Dr Daniel Bensimhon . Dr Dalton McLean . Dr Brandon Winfrey . Amy Clegg, NP . Brittainy Simmons, PA . Jessica Milford,NP . Lauren Kemp, PharmD   Please be sure to bring in all your medications bottles to every appointment.     

## 2021-04-05 NOTE — Progress Notes (Signed)
ID:  Shawn Webster, DOB 14-Nov-1956, MRN [id number]   Provider location: [place] [facility] Type of Visit: Established patient   PCP:  Renford Dills, MD  Cardiologist:  Marca Ancona, MD   History of Present Illness: Shawn Webster is a 65 y.o. male who has a h/o chronic systolic heart failure (08/2017), LV thrombus on coumadin, severe CAD, and former smoker.   Admitted in 10/18 with increased dyspnea and fatigue. ECHO showed severely reduced EF ~20%.  LHC showed severe coronary disease: occluded LAD without collaterals and diffusely diseased LCx.  The LCx territory did not appear viable. Evaluated by Dr [place] and Dr Laneta Simmers with recommendations for medical management, for the LAD there was no option for CTO revascularization or CABG. Started on HF meds.  [employer] ICD placed.    Echo in 3/20 showed EF 25-30% with moderate LV dilation.  Echo was done today and reviewed, EF 30-35%, inferolateral and apical akinesis, anterolateral and inferior hypokinesis, no LV thrombus.   He presents today for followup of CHF and CAD.  He is doing well today.  Weight down 13 lbs with diet and exercise.  [place]  No chest pain.  No significant exertional dyspnea.  No orthopnea/PND.  No BRBPR/melena.  BP is on the low side today but he denies orthostatic symptoms.    [employer] device interrogation: Heartlogic 5, no AF.   ECG (personally reviewed): NSR, inferior Qs, iRBBB  Labs (10/18): LDL 106 Labs (1/19): K 5.3 => 4.8, creatinine 0.82 Labs (3/19): K 4.7, creatinine 0.83 Labs (4/19): LDL 89 Labs (6/19): K 5.1, creatinine 1.05 Labs (7/19): LDL 50, HDL 51, LFTs normal Labs (5/09): K 4.6, creatinine 0.97, hgb 13.1 Labs (12/19): K 5.2, creatinine 1.16 Labs (3/21): K 5, creatinine 1.1 Labs (1/22): K 5, creatinine 1.21, LDL 54, HDL 66  PMH: 1. Chronic systolic CHF: Ischemic cardiomyopathy.  - Echo (10/18): EF 20-25%,  diffuse hypokinesis, mildly decreased RV systolic function.  - Cardiac MRI (10/18): Moderately dilated LV with mild LVH, EF 19% with wall motion abnormalities, LV thrombus, LGE pattern suggesting inferolateral wall would not be expected to improve with revascularization of the left circumflex. There did appear to be significant viability within the LAD territory with the exception of the apex. - RHC (10/18): mean RA 8, PA 54/23 mean 34, mean PCWP 22, CI 2.62, PVR 2 WU.  - Echo (2/19): EF 30%, wall motion abnormalities, mild LV dilation, chronic-appearing LV thrombus.  - [employer] ICD - Echo (3/20): LV moderately dilated with EF 25-30%, RV normal size/systolic function.  - CPX (9/20): Peak VO2 17.6, VE/VCO2 slope 41, RER 1.16. Moderate to severe HF limitation.  - Echo (5/22): EF 30-35%, inferolateral and apical akinesis, anterolateral and inferior hypokinesis, no LV thrombus.  2. LV apical thrombus.  3. CAD: LHC (10/18) with totally occluded LAD with minimal collateralization, diffuse severe disease in LCx. Films reviewed by Dr. Laneta Simmers and Dr [place], he was not thought to be candidate for CABG or CTO PCI.   4. Hyperlipidemia 5. HTN 6. H/o Bell's palsy.  7. H/o ETOH abuse 8. Gout   Review of systems complete and found to be negative unless listed in HPI.     Social History   Socioeconomic History  . Marital status: Married    Spouse name: Not on file  . Number of children: Not on file  . Years of education: Not  on file  . Highest education level: Not on file  Occupational History  . Not on file  Tobacco Use  . Smoking status: Former Smoker    Types: Cigars    Quit date: 08/21/2017    Years since quitting: 3.6  . Smokeless tobacco: Never Used  . Tobacco comment: 1 cigar per day  Vaping Use  . Vaping Use: Never used  Substance and Sexual Activity  . Alcohol use: Yes    Comment: 2 martinis per day  . Drug use: No  . Sexual activity: Not on file  Other Topics Concern  .  Not on file  Social History Narrative  . Not on file   Social Determinants of Health   Financial Resource Strain: Not on file  Food Insecurity: Not on file  Transportation Needs: Not on file  Physical Activity: Not on file  Stress: Not on file  Social Connections: Not on file  Intimate Partner Violence: Not on file    Family History  Problem Relation Age of Onset  . Dementia Mother   . CAD Father     Past Medical History:  Diagnosis Date  . Bell's palsy   . CHF (congestive heart failure) (HCC)   . Hypertension     Current Outpatient Medications  Medication Sig Dispense Refill  . carvedilol (COREG) 25 MG tablet TAKE 1 TABLET(25 MG) BY MOUTH TWICE DAILY WITH A MEAL 60 tablet 2  . dapagliflozin propanediol (FARXIGA) 10 MG TABS tablet Take 1 tablet (10 mg total) by mouth daily. 30 tablet 3  . ENTRESTO 97-103 MG TAKE 1 TABLET BY MOUTH TWICE DAILY 60 tablet 5  . glimepiride (AMARYL) 2 MG tablet Take 2 mg by mouth daily with breakfast.    . rosuvastatin (CRESTOR) 40 MG tablet TAKE 1 TABLET BY MOUTH DAILY 90 tablet 3  . spironolactone (ALDACTONE) 25 MG tablet TAKE 1 TABLET(25 MG) BY MOUTH DAILY 30 tablet 5  . warfarin (COUMADIN) 5 MG tablet Take 1 tablet (5 mg total) by mouth daily. Start taking warfarin 5mg  tablets after you have finished you current warfarin supply. 100 tablet 0   No current facility-administered medications for this encounter.    Vitals:   04/05/21 1128  BP: 90/60  Pulse: 61  SpO2: 98%  Weight: 83.1 kg (183 lb 3.2 oz)   Wt Readings from Last 3 Encounters:  04/05/21 83.1 kg (183 lb 3.2 oz)  12/04/20 89.1 kg (196 lb 6.4 oz)  01/14/20 94.1 kg (207 lb 6.4 oz)   PHYSICAL EXAM: General: NAD Neck: No JVD, no thyromegaly or thyroid nodule.  Lungs: Clear to auscultation bilaterally with normal respiratory effort. CV: Nondisplaced PMI.  Heart regular S1/S2, no S3/S4, no murmur.  No peripheral edema.  No carotid bruit.  Normal pedal pulses.  Abdomen: Soft,  nontender, no hepatosplenomegaly, no distention.  Skin: Intact without lesions or rashes.  Neurologic: Alert and oriented x 3.  Psych: Normal affect. Extremities: No clubbing or cyanosis.  HEENT: Normal.   ASSESSMENT & PLAN: 1. Chronic Systolic Heart Failure: Echo 08/2017 ECHO 20-25%. Ischemic cardiomyopathy and ETOH may be both have played a role. CPX in 9/20 with moderate-severe HF limitation. Echo today showed EF 30-35%.  He has a 10/20 ICD.  NYHA class II symptoms.  He is not volume overloaded by exam or Heartlogic and weight is down.  - Continue to use Lasix prn.  - Continue Coreg 25 mg bid.    - Continue Entresto 97/103 bid. BMET today.  -  Continue spironolactone 25 mg daily.  - QRS narrow on ECG so no role for CRT.   - Continue dapagliflozin 10 mg daily.  2. CAD: Severe 2 vessel disease on 10/18 cath. Diffuse, severely diseased LCx system and chronic total occlusion proximal LAD.  Cardiac MRI showed inferolateral aneurysm. Inferolateral wall did not appear viable so suspect intervention on LCx would not be helpful. There did appear to be viability in the anterior and anteroseptal walls, the true apex was nonviable.  I discussed with Dr. [place] and Dr. Laneta Simmers during his 10/18 admission: not candidate for CTO of LAD or CABG. No chest pain.  - Continue Crestor 40 mg daily, good lipids in 1/22.    - Given use of warfarin, not taking ASA.  3. LV Thrombus: Continue warfarin. Not seen on today's echo.  4. Smoking: He has quit.  5. ETOH: Minimal now.  6. Type II diabetes: On dapagliflozin.   Followup in 4 months.   Signed, Marca Ancona, MD  04/05/2021  [facility] [place] [address] [facility] [place] [address] [phone] (office) [phone] (fax)

## 2021-04-09 ENCOUNTER — Ambulatory Visit (HOSPITAL_COMMUNITY)
Admission: RE | Admit: 2021-04-09 | Discharge: 2021-04-09 | Disposition: A | Discharge status: Home / Self Care | Payer: BC Managed Care – PPO | Source: Ambulatory Visit | Attending: Cardiology | Admitting: Cardiology

## 2021-04-09 ENCOUNTER — Other Ambulatory Visit: Payer: Self-pay

## 2021-04-09 ENCOUNTER — Other Ambulatory Visit (HOSPITAL_COMMUNITY): Payer: Self-pay | Admitting: Cardiology

## 2021-04-09 ENCOUNTER — Ambulatory Visit (INDEPENDENT_AMBULATORY_CARE_PROVIDER_SITE_OTHER): Payer: BC Managed Care – PPO

## 2021-04-09 DIAGNOSIS — I240 Acute coronary thrombosis not resulting in myocardial infarction: Secondary | ICD-10-CM

## 2021-04-09 DIAGNOSIS — I513 Intracardiac thrombosis, not elsewhere classified: Secondary | ICD-10-CM | POA: Diagnosis not present

## 2021-04-09 DIAGNOSIS — Z5181 Encounter for therapeutic drug level monitoring: Secondary | ICD-10-CM | POA: Diagnosis not present

## 2021-04-09 DIAGNOSIS — I5022 Chronic systolic (congestive) heart failure: Secondary | ICD-10-CM

## 2021-04-09 LAB — POCT INR: INR: 1.5 — AB (ref 2.0–3.0)

## 2021-04-09 NOTE — Patient Instructions (Signed)
Description   Take 1.5 tablets (7.5mg ) today and tomorrow, then start taking Warfarin 1 tablet (5mg ) daily except 1.5 tablets(7.5mg ) on Fridays. Recheck INR in 3 weeks. [phone] Call coumadin clinic for changes in medications or up coming procedures.

## 2021-04-12 DIAGNOSIS — R400 Somnolence: Secondary | ICD-10-CM | POA: Diagnosis not present

## 2021-04-12 DIAGNOSIS — I9589 Other hypotension: Secondary | ICD-10-CM | POA: Diagnosis not present

## 2021-04-12 DIAGNOSIS — E11649 Type 2 diabetes mellitus with hypoglycemia without coma: Secondary | ICD-10-CM | POA: Diagnosis not present

## 2021-04-13 DIAGNOSIS — E11649 Type 2 diabetes mellitus with hypoglycemia without coma: Secondary | ICD-10-CM | POA: Diagnosis not present

## 2021-04-13 DIAGNOSIS — K4090 Unilateral inguinal hernia, without obstruction or gangrene, not specified as recurrent: Secondary | ICD-10-CM | POA: Diagnosis not present

## 2021-04-13 DIAGNOSIS — I959 Hypotension, unspecified: Secondary | ICD-10-CM | POA: Diagnosis not present

## 2021-04-13 DIAGNOSIS — R001 Bradycardia, unspecified: Secondary | ICD-10-CM | POA: Diagnosis not present

## 2021-04-13 DIAGNOSIS — I5022 Chronic systolic (congestive) heart failure: Secondary | ICD-10-CM | POA: Diagnosis not present

## 2021-04-13 DIAGNOSIS — I255 Ischemic cardiomyopathy: Secondary | ICD-10-CM | POA: Diagnosis not present

## 2021-04-13 DIAGNOSIS — Z7984 Long term (current) use of oral hypoglycemic drugs: Secondary | ICD-10-CM | POA: Diagnosis not present

## 2021-04-13 DIAGNOSIS — K449 Diaphragmatic hernia without obstruction or gangrene: Secondary | ICD-10-CM | POA: Diagnosis not present

## 2021-04-13 DIAGNOSIS — I77810 Thoracic aortic ectasia: Secondary | ICD-10-CM | POA: Diagnosis not present

## 2021-04-13 DIAGNOSIS — N3289 Other specified disorders of bladder: Secondary | ICD-10-CM | POA: Diagnosis not present

## 2021-04-13 DIAGNOSIS — Z95810 Presence of automatic (implantable) cardiac defibrillator: Secondary | ICD-10-CM | POA: Diagnosis not present

## 2021-04-13 DIAGNOSIS — N179 Acute kidney failure, unspecified: Secondary | ICD-10-CM | POA: Diagnosis not present

## 2021-04-13 DIAGNOSIS — I2510 Atherosclerotic heart disease of native coronary artery without angina pectoris: Secondary | ICD-10-CM | POA: Diagnosis not present

## 2021-04-13 DIAGNOSIS — Z7901 Long term (current) use of anticoagulants: Secondary | ICD-10-CM | POA: Diagnosis not present

## 2021-04-13 DIAGNOSIS — Z87891 Personal history of nicotine dependence: Secondary | ICD-10-CM | POA: Diagnosis not present

## 2021-04-13 DIAGNOSIS — E162 Hypoglycemia, unspecified: Secondary | ICD-10-CM | POA: Diagnosis not present

## 2021-04-22 ENCOUNTER — Telehealth (HOSPITAL_COMMUNITY): Payer: Self-pay | Admitting: *Deleted

## 2021-04-22 DIAGNOSIS — E1169 Type 2 diabetes mellitus with other specified complication: Secondary | ICD-10-CM | POA: Diagnosis not present

## 2021-04-22 DIAGNOSIS — E162 Hypoglycemia, unspecified: Secondary | ICD-10-CM | POA: Diagnosis not present

## 2021-04-22 DIAGNOSIS — I255 Ischemic cardiomyopathy: Secondary | ICD-10-CM | POA: Diagnosis not present

## 2021-04-22 DIAGNOSIS — E7800 Pure hypercholesterolemia, unspecified: Secondary | ICD-10-CM | POA: Diagnosis not present

## 2021-04-22 NOTE — Telephone Encounter (Signed)
Pt called asking if he should restart spiro and carvedilol. Pt was admitted to the hospital over night when he was visiting [facility] and that Dr.McLean is aware.  He was told by the hospital to hold these two medications until his systolic bp was over 100 and he had talked to his cardiologist. Pt said he feels fine and his bp is 103/78.    Routed to Dr.McLean for advice

## 2021-04-22 NOTE — Telephone Encounter (Signed)
He may restart Coreg and spironolactone

## 2021-04-23 NOTE — Telephone Encounter (Signed)
Pt aware and agreeable with plan.  

## 2021-04-30 ENCOUNTER — Ambulatory Visit (INDEPENDENT_AMBULATORY_CARE_PROVIDER_SITE_OTHER): Payer: BC Managed Care – PPO | Admitting: *Deleted

## 2021-04-30 ENCOUNTER — Other Ambulatory Visit: Payer: Self-pay

## 2021-04-30 DIAGNOSIS — I240 Acute coronary thrombosis not resulting in myocardial infarction: Secondary | ICD-10-CM

## 2021-04-30 DIAGNOSIS — I513 Intracardiac thrombosis, not elsewhere classified: Secondary | ICD-10-CM

## 2021-04-30 DIAGNOSIS — Z5181 Encounter for therapeutic drug level monitoring: Secondary | ICD-10-CM | POA: Diagnosis not present

## 2021-04-30 LAB — POCT INR: INR: 1.4 — AB (ref 2.0–3.0)

## 2021-04-30 NOTE — Patient Instructions (Signed)
Description   Take 2 tablets (10mg ) today and 1.5 tablets (7.5mg ) tomorrow, then start taking Warfarin 1 tablet (5mg ) daily except 1.5 tablets (7.5mg ) on Mondays and Fridays. Recheck INR in 1 week. [phone] Call coumadin clinic for changes in medications or up coming procedures.

## 2021-05-04 ENCOUNTER — Ambulatory Visit (INDEPENDENT_AMBULATORY_CARE_PROVIDER_SITE_OTHER): Payer: BC Managed Care – PPO

## 2021-05-04 DIAGNOSIS — I240 Acute coronary thrombosis not resulting in myocardial infarction: Secondary | ICD-10-CM

## 2021-05-04 LAB — CUP PACEART REMOTE DEVICE CHECK
Battery Remaining Longevity: 138 mo
Battery Remaining Percentage: 100 %
Brady Statistic RA Percent Paced: 1 %
Brady Statistic RV Percent Paced: 2 %
Date Time Interrogation Session: 20220614092800
HighPow Impedance: 64 Ohm
Implantable Lead Implant Date: 20190910
Implantable Lead Implant Date: 20190910
Implantable Lead Location: 753859
Implantable Lead Location: 753860
Implantable Lead Model: 293
Implantable Lead Model: 7741
Implantable Lead Serial Number: 1054233
Implantable Lead Serial Number: 440947
Implantable Pulse Generator Implant Date: 20190910
Lead Channel Impedance Value: 397 Ohm
Lead Channel Impedance Value: 730 Ohm
Lead Channel Setting Pacing Amplitude: 2.5 V
Lead Channel Setting Pacing Amplitude: 2.5 V
Lead Channel Setting Pacing Pulse Width: 0.4 ms
Lead Channel Setting Sensing Sensitivity: 0.5 mV
Pulse Gen Serial Number: 550897

## 2021-05-07 ENCOUNTER — Other Ambulatory Visit: Payer: Self-pay

## 2021-05-07 ENCOUNTER — Ambulatory Visit (INDEPENDENT_AMBULATORY_CARE_PROVIDER_SITE_OTHER): Payer: BC Managed Care – PPO | Admitting: Pharmacist

## 2021-05-07 DIAGNOSIS — Z5181 Encounter for therapeutic drug level monitoring: Secondary | ICD-10-CM

## 2021-05-07 DIAGNOSIS — I513 Intracardiac thrombosis, not elsewhere classified: Secondary | ICD-10-CM

## 2021-05-07 DIAGNOSIS — I240 Acute coronary thrombosis not resulting in myocardial infarction: Secondary | ICD-10-CM | POA: Diagnosis not present

## 2021-05-07 LAB — POCT INR: INR: 2.1 (ref 2.0–3.0)

## 2021-05-07 NOTE — Patient Instructions (Signed)
Description   Continue taking Warfarin 1 tablet (5mg ) daily except 1.5 tablets (7.5mg ) on Mondays and Fridays. Recheck INR in 2-3 weeks. [phone] Call coumadin clinic for changes in medications or up coming procedures.

## 2021-05-26 NOTE — Progress Notes (Signed)
Remote ICD transmission.   

## 2021-06-04 DIAGNOSIS — E7800 Pure hypercholesterolemia, unspecified: Secondary | ICD-10-CM | POA: Diagnosis not present

## 2021-06-04 DIAGNOSIS — I2510 Atherosclerotic heart disease of native coronary artery without angina pectoris: Secondary | ICD-10-CM | POA: Diagnosis not present

## 2021-06-04 DIAGNOSIS — E1169 Type 2 diabetes mellitus with other specified complication: Secondary | ICD-10-CM | POA: Diagnosis not present

## 2021-06-04 DIAGNOSIS — Z7901 Long term (current) use of anticoagulants: Secondary | ICD-10-CM | POA: Diagnosis not present

## 2021-06-07 ENCOUNTER — Other Ambulatory Visit: Payer: Self-pay

## 2021-06-07 ENCOUNTER — Ambulatory Visit (INDEPENDENT_AMBULATORY_CARE_PROVIDER_SITE_OTHER): Payer: BC Managed Care – PPO

## 2021-06-07 DIAGNOSIS — I513 Intracardiac thrombosis, not elsewhere classified: Secondary | ICD-10-CM | POA: Diagnosis not present

## 2021-06-07 DIAGNOSIS — Z5181 Encounter for therapeutic drug level monitoring: Secondary | ICD-10-CM | POA: Diagnosis not present

## 2021-06-07 DIAGNOSIS — I240 Acute coronary thrombosis not resulting in myocardial infarction: Secondary | ICD-10-CM | POA: Diagnosis not present

## 2021-06-07 LAB — POCT INR: INR: 2.3 (ref 2.0–3.0)

## 2021-06-07 NOTE — Patient Instructions (Signed)
Continue taking Warfarin 1 tablet (5mg ) daily except 1.5 tablets (7.5mg ) on Mondays and Fridays. Recheck INR in 4 weeks. [phone] Call coumadin clinic for changes in medications or up coming procedures.

## 2021-06-08 DIAGNOSIS — Z1211 Encounter for screening for malignant neoplasm of colon: Secondary | ICD-10-CM | POA: Diagnosis not present

## 2021-07-05 ENCOUNTER — Other Ambulatory Visit: Payer: Self-pay

## 2021-07-05 ENCOUNTER — Ambulatory Visit (INDEPENDENT_AMBULATORY_CARE_PROVIDER_SITE_OTHER): Payer: BC Managed Care – PPO | Admitting: *Deleted

## 2021-07-05 DIAGNOSIS — I513 Intracardiac thrombosis, not elsewhere classified: Secondary | ICD-10-CM

## 2021-07-05 DIAGNOSIS — I240 Acute coronary thrombosis not resulting in myocardial infarction: Secondary | ICD-10-CM

## 2021-07-05 DIAGNOSIS — Z5181 Encounter for therapeutic drug level monitoring: Secondary | ICD-10-CM

## 2021-07-05 LAB — POCT INR: INR: 1.9 — AB (ref 2.0–3.0)

## 2021-07-05 MED ORDER — WARFARIN SODIUM 5 MG PO TABS: 5.0000 mg | ORAL_TABLET | Freq: Every day | ORAL | 0 refills | Status: DC

## 2021-07-05 NOTE — Patient Instructions (Signed)
Description   Take 2 tablets of warfarin today and then continue taking Warfarin 1 tablet (5mg ) daily except 1.5 tablets (7.5mg ) on Mondays and Fridays. Recheck INR in 3 weeks. [phone] Call coumadin clinic for changes in medications or up coming procedures.

## 2021-07-27 ENCOUNTER — Ambulatory Visit (INDEPENDENT_AMBULATORY_CARE_PROVIDER_SITE_OTHER): Payer: BC Managed Care – PPO

## 2021-07-27 ENCOUNTER — Other Ambulatory Visit: Payer: Self-pay

## 2021-07-27 DIAGNOSIS — I513 Intracardiac thrombosis, not elsewhere classified: Secondary | ICD-10-CM

## 2021-07-27 DIAGNOSIS — I240 Acute coronary thrombosis not resulting in myocardial infarction: Secondary | ICD-10-CM

## 2021-07-27 DIAGNOSIS — Z5181 Encounter for therapeutic drug level monitoring: Secondary | ICD-10-CM | POA: Diagnosis not present

## 2021-07-27 LAB — POCT INR: INR: 1.5 — AB (ref 2.0–3.0)

## 2021-07-27 NOTE — Patient Instructions (Signed)
Description   Take 2 tablets of warfarin today and 1.5 tablets tomorrow and then START taking Warfarin 1 tablet (5mg ) daily except 1.5 tablets (7.5mg ) on Mondays, Wednesdays, and Fridays. Recheck INR in 1 week. [phone] Call coumadin clinic for changes in medications or up coming procedures.

## 2021-08-03 ENCOUNTER — Ambulatory Visit: Payer: BC Managed Care – PPO

## 2021-08-04 ENCOUNTER — Other Ambulatory Visit: Payer: Self-pay

## 2021-08-04 ENCOUNTER — Ambulatory Visit (INDEPENDENT_AMBULATORY_CARE_PROVIDER_SITE_OTHER): Payer: BC Managed Care – PPO

## 2021-08-04 DIAGNOSIS — I513 Intracardiac thrombosis, not elsewhere classified: Secondary | ICD-10-CM | POA: Diagnosis not present

## 2021-08-04 DIAGNOSIS — I240 Acute coronary thrombosis not resulting in myocardial infarction: Secondary | ICD-10-CM | POA: Diagnosis not present

## 2021-08-04 DIAGNOSIS — Z5181 Encounter for therapeutic drug level monitoring: Secondary | ICD-10-CM

## 2021-08-04 LAB — CUP PACEART REMOTE DEVICE CHECK
Battery Remaining Longevity: 138 mo
Battery Remaining Percentage: 100 %
Brady Statistic RA Percent Paced: 1 %
Brady Statistic RV Percent Paced: 2 %
Date Time Interrogation Session: 20220913085000
HighPow Impedance: 63 Ohm
Implantable Lead Implant Date: 20190910
Implantable Lead Implant Date: 20190910
Implantable Lead Location: 753859
Implantable Lead Location: 753860
Implantable Lead Model: 293
Implantable Lead Model: 7741
Implantable Lead Serial Number: 1054233
Implantable Lead Serial Number: 440947
Implantable Pulse Generator Implant Date: 20190910
Lead Channel Impedance Value: 408 Ohm
Lead Channel Impedance Value: 750 Ohm
Lead Channel Setting Pacing Amplitude: 2.5 V
Lead Channel Setting Pacing Amplitude: 2.5 V
Lead Channel Setting Pacing Pulse Width: 0.4 ms
Lead Channel Setting Sensing Sensitivity: 0.5 mV
Pulse Gen Serial Number: 550897

## 2021-08-04 LAB — POCT INR: INR: 2.2 (ref 2.0–3.0)

## 2021-08-04 NOTE — Patient Instructions (Signed)
Description   Continue on same dosage of  Warfarin 1 tablet (5mg ) daily except 1.5 tablets (7.5mg ) on Mondays, Wednesdays, and Fridays. Recheck INR in 3 weeks. [phone] Call coumadin clinic for changes in medications or up coming procedures.

## 2021-08-06 ENCOUNTER — Encounter (HOSPITAL_COMMUNITY): Payer: Self-pay | Admitting: Cardiology

## 2021-08-06 ENCOUNTER — Ambulatory Visit (INDEPENDENT_AMBULATORY_CARE_PROVIDER_SITE_OTHER): Payer: BC Managed Care – PPO

## 2021-08-06 ENCOUNTER — Ambulatory Visit (HOSPITAL_COMMUNITY)
Admission: RE | Admit: 2021-08-06 | Discharge: 2021-08-06 | Disposition: A | Discharge status: Home / Self Care | Payer: BC Managed Care – PPO | Source: Ambulatory Visit | Attending: Cardiology | Admitting: Cardiology

## 2021-08-06 ENCOUNTER — Other Ambulatory Visit: Payer: Self-pay

## 2021-08-06 ENCOUNTER — Telehealth (HOSPITAL_COMMUNITY): Payer: Self-pay

## 2021-08-06 VITALS — BP 96/79 | HR 59 | Wt 182.8 lb

## 2021-08-06 DIAGNOSIS — Z86718 Personal history of other venous thrombosis and embolism: Secondary | ICD-10-CM | POA: Insufficient documentation

## 2021-08-06 DIAGNOSIS — Z09 Encounter for follow-up examination after completed treatment for conditions other than malignant neoplasm: Secondary | ICD-10-CM | POA: Insufficient documentation

## 2021-08-06 DIAGNOSIS — I110 Hypertensive heart disease with heart failure: Secondary | ICD-10-CM | POA: Diagnosis not present

## 2021-08-06 DIAGNOSIS — Z7984 Long term (current) use of oral hypoglycemic drugs: Secondary | ICD-10-CM | POA: Insufficient documentation

## 2021-08-06 DIAGNOSIS — I2510 Atherosclerotic heart disease of native coronary artery without angina pectoris: Secondary | ICD-10-CM | POA: Insufficient documentation

## 2021-08-06 DIAGNOSIS — Z95810 Presence of automatic (implantable) cardiac defibrillator: Secondary | ICD-10-CM | POA: Insufficient documentation

## 2021-08-06 DIAGNOSIS — Z7901 Long term (current) use of anticoagulants: Secondary | ICD-10-CM | POA: Insufficient documentation

## 2021-08-06 DIAGNOSIS — Z8249 Family history of ischemic heart disease and other diseases of the circulatory system: Secondary | ICD-10-CM | POA: Insufficient documentation

## 2021-08-06 DIAGNOSIS — I5022 Chronic systolic (congestive) heart failure: Secondary | ICD-10-CM

## 2021-08-06 DIAGNOSIS — Z87891 Personal history of nicotine dependence: Secondary | ICD-10-CM | POA: Diagnosis not present

## 2021-08-06 DIAGNOSIS — I255 Ischemic cardiomyopathy: Secondary | ICD-10-CM | POA: Insufficient documentation

## 2021-08-06 DIAGNOSIS — Z79899 Other long term (current) drug therapy: Secondary | ICD-10-CM | POA: Insufficient documentation

## 2021-08-06 LAB — BASIC METABOLIC PANEL
Anion gap: 6 (ref 5–15)
BUN: 27 mg/dL — ABNORMAL HIGH (ref 8–23)
CO2: 28 mmol/L (ref 22–32)
Calcium: 9.2 mg/dL (ref 8.9–10.3)
Chloride: 105 mmol/L (ref 98–111)
Creatinine, Ser: 1.06 mg/dL (ref 0.61–1.24)
GFR, Estimated: >60 mL/min (ref >60)
Glucose, Bld: 117 mg/dL — ABNORMAL HIGH (ref 70–99)
Potassium: 5.8 mmol/L — ABNORMAL HIGH (ref 3.5–5.1)
Sodium: 139 mmol/L (ref 135–145)

## 2021-08-06 MED ORDER — LOKELMA 10 G PO PACK: PACK | ORAL | 0 refills | Status: AC

## 2021-08-06 NOTE — Telephone Encounter (Signed)
-----   Message from Laurey Morale, MD sent at 08/06/2021  4:19 PM EDT ----- Start Lokelma 10 g x 1 then 5 g daily after that.  BMET on Monday.

## 2021-08-06 NOTE — Telephone Encounter (Signed)
Patient advised and verbalized understanding,lab appointment scheduled,lab orders entered, Rx sent into pharmacy.   Meds ordered this encounter  Medications   sodium zirconium cyclosilicate (LOKELMA) 10 g PACK packet    Sig: Take 1 full packet the first day then a 1/2 packet daily after the first day.    Dispense:  30 packet    Refill:  0   Orders Placed This Encounter  Procedures   Basic metabolic panel    Standing Status:   Future    Standing Expiration Date:   08/06/2022    Order Specific Question:   Release to patient    Answer:   Immediate

## 2021-08-06 NOTE — Patient Instructions (Addendum)
Please follow a low Potassium diet, see attached information  Labs done today, your results will be available in MyChart, we will contact you for abnormal readings.  Your physician recommends that you schedule a follow-up appointment in: 4 months (January 2023) PLEASE CALL OUR OFFICE IN December TO SCHEDULE THIS APPOINTMENT  Do the following things EVERYDAY: Weigh yourself in the morning before breakfast. Write it down and keep it in a log. Take your medicines as prescribed Eat low salt foods--Limit salt (sodium) to 2000 mg per day.  Stay as active as you can everyday Limit all fluids for the day to less than 2 liters  If you have any questions or concerns before your next appointment please send [place] a message through [place] or call our office at [phone].    TO LEAVE A MESSAGE FOR THE NURSE SELECT OPTION 2, PLEASE LEAVE A MESSAGE INCLUDING: YOUR NAME DATE OF BIRTH CALL BACK NUMBER REASON FOR CALL**this is important as we prioritize the call backs  YOU WILL RECEIVE A CALL BACK THE SAME DAY AS LONG AS YOU CALL BEFORE 4:00 PM  At the [facility], you and your health needs are our priority. As part of our continuing mission to provide you with exceptional heart care, we have created designated Provider Care Teams. These Care Teams include your primary Cardiologist (physician) and Advanced Practice Providers (APPs- Physician Assistants and Nurse Practitioners) who all work together to provide you with the care you need, when you need it.   You may see any of the following providers on your designated Care Team at your next follow up: Dr Arvilla Meres Dr Marca Ancona Dr Brandon Melnick, NP Robbie Lis, [place] Mikki Santee Karle Plumber, PharmD   Please be sure to bring in all your medications bottles to every appointment.

## 2021-08-09 ENCOUNTER — Ambulatory Visit (HOSPITAL_COMMUNITY)
Admission: RE | Admit: 2021-08-09 | Discharge: 2021-08-09 | Disposition: A | Discharge status: Home / Self Care | Payer: BC Managed Care – PPO | Source: Ambulatory Visit | Attending: Internal Medicine | Admitting: Internal Medicine

## 2021-08-09 ENCOUNTER — Other Ambulatory Visit: Payer: Self-pay

## 2021-08-09 DIAGNOSIS — I513 Intracardiac thrombosis, not elsewhere classified: Secondary | ICD-10-CM

## 2021-08-09 DIAGNOSIS — I5022 Chronic systolic (congestive) heart failure: Secondary | ICD-10-CM | POA: Diagnosis not present

## 2021-08-09 DIAGNOSIS — I240 Acute coronary thrombosis not resulting in myocardial infarction: Secondary | ICD-10-CM

## 2021-08-09 DIAGNOSIS — Z5181 Encounter for therapeutic drug level monitoring: Secondary | ICD-10-CM

## 2021-08-09 LAB — CUP PACEART REMOTE DEVICE CHECK
Battery Remaining Longevity: 138 mo
Battery Remaining Percentage: 100 %
Brady Statistic RA Percent Paced: 1 %
Brady Statistic RV Percent Paced: 2 %
Date Time Interrogation Session: 20220916113000
HighPow Impedance: 63 Ohm
Implantable Lead Implant Date: 20190910
Implantable Lead Implant Date: 20190910
Implantable Lead Location: 753859
Implantable Lead Location: 753860
Implantable Lead Model: 293
Implantable Lead Model: 7741
Implantable Lead Serial Number: 1054233
Implantable Lead Serial Number: 440947
Implantable Pulse Generator Implant Date: 20190910
Lead Channel Impedance Value: 410 Ohm
Lead Channel Impedance Value: 765 Ohm
Lead Channel Setting Pacing Amplitude: 2.5 V
Lead Channel Setting Pacing Amplitude: 2.5 V
Lead Channel Setting Pacing Pulse Width: 0.4 ms
Lead Channel Setting Sensing Sensitivity: 0.5 mV
Pulse Gen Serial Number: 550897

## 2021-08-09 LAB — BASIC METABOLIC PANEL
Anion gap: 6 (ref 5–15)
BUN: 24 mg/dL — ABNORMAL HIGH (ref 8–23)
CO2: 28 mmol/L (ref 22–32)
Calcium: 8.6 mg/dL — ABNORMAL LOW (ref 8.9–10.3)
Chloride: 104 mmol/L (ref 98–111)
Creatinine, Ser: 1.01 mg/dL (ref 0.61–1.24)
GFR, Estimated: >60 mL/min (ref >60)
Glucose, Bld: 111 mg/dL — ABNORMAL HIGH (ref 70–99)
Potassium: 4.7 mmol/L (ref 3.5–5.1)
Sodium: 138 mmol/L (ref 135–145)

## 2021-08-09 NOTE — Progress Notes (Signed)
ID:  Shawn Webster, DOB 29-Oct-1956, MRN [id number]   Provider location: [facility] Type of Visit: Established patient   PCP:  Renford Dills, MD  Cardiologist:  Marca Ancona, MD   History of Present Illness: Shawn Webster is a 65 y.o. male who has a h/o chronic systolic heart failure (08/2017), LV thrombus on coumadin, severe CAD, and former smoker.   Admitted in 10/18 with increased dyspnea and fatigue. ECHO showed severely reduced EF ~20%.  LHC showed severe coronary disease: occluded LAD without collaterals and diffusely diseased LCx.  The LCx territory did not appear viable. Evaluated by Dr Swazil[place] and Dr Laneta Simmers with recommendations for medical management, for the LAD there was no option for CTO revascularization or CABG. Started on HF meds.  [employer] ICD placed.    Echo in 3/20 showed EF 25-30% with moderate LV dilation.  Echo was done today and reviewed, EF 30-35%, inferolateral and apical akinesis, anterolateral and inferior hypokinesis, no LV thrombus.   He presents today for followup of CHF and CAD.  He is doing well today.  SBP in 90s but no lightheadedness.  He does a lot of yardwork, [place]  No dyspnea walking on flat ground or up stairs.  No chest pain.  K was elevated in 5/22 but patient never returned for repeat BMET.  BMET was done today, K 5.8.   [employer] device interrogation: Heartlogic 0, no AF.   ECG (personally reviewed): Sinus bradycardia 50s, 1st degree AVB, left axis deviation.   Labs (10/18): LDL 106 Labs (1/19): K 5.3 => 4.8, creatinine 0.82 Labs (3/19): K 4.7, creatinine 0.83 Labs (4/19): LDL 89 Labs (6/19): K 5.1, creatinine 1.05 Labs (7/19): LDL 50, HDL 51, LFTs normal Labs (2/50): K 4.6, creatinine 0.97, hgb 13.1 Labs (12/19): K 5.2, creatinine 1.16 Labs (3/21): K 5, creatinine 1.1 Labs (1/22): K 5, creatinine 1.21, LDL 54, HDL 66 Labs (5/22): K  5.7, creatinine 1.18 Labs (9/22): K 5.8, creatinine 1.06  PMH: 1. Chronic systolic CHF: Ischemic cardiomyopathy.  - Echo (10/18): EF 20-25%, diffuse hypokinesis, mildly decreased RV systolic function.  - Cardiac MRI (10/18): Moderately dilated LV with mild LVH, EF 19% with wall motion abnormalities, LV thrombus, LGE pattern suggesting inferolateral wall would not be expected to improve with revascularization of the left circumflex. There did appear to be significant viability within the LAD territory with the exception of the apex. - RHC (10/18): mean RA 8, PA 54/23 mean 34, mean PCWP 22, CI 2.62, PVR 2 WU.  - Echo (2/19): EF 30%, wall motion abnormalities, mild LV dilation, chronic-appearing LV thrombus.  - [employer] ICD - Echo (3/20): LV moderately dilated with EF 25-30%, RV normal size/systolic function.  - CPX (9/20): Peak VO2 17.6, VE/VCO2 slope 41, RER 1.16. Moderate to severe HF limitation.  - Echo (5/22): EF 30-35%, inferolateral and apical akinesis, anterolateral and inferior hypokinesis, no LV thrombus.  2. LV apical thrombus.  3. CAD: LHC (10/18) with totally occluded LAD with minimal collateralization, diffuse severe disease in LCx. Films reviewed by Dr. Laneta Simmers and Dr Swaz[place], he was not thought to be candidate for CABG or CTO PCI.   4. Hyperlipidemia 5. HTN 6. H/o Bell's palsy.  7. H/o ETOH abuse 8. Gout    Review of systems complete and found to be negative unless listed in HPI.     Social History   Socioeconomic  History   Marital status: Married    Spouse name: Not on file   Number of children: Not on file   Years of education: Not on file   Highest education level: Not on file  Occupational History   Not on file  Tobacco Use   Smoking status: Former    Types: Cigars    Quit date: 08/21/2017    Years since quitting: 3.9   Smokeless tobacco: Never   Tobacco comments:    1 cigar per day  Vaping Use   Vaping Use: Never used  Substance and Sexual Activity    Alcohol use: Yes    Comment: 2 martinis per day   Drug use: No   Sexual activity: Not on file  Other Topics Concern   Not on file  Social History Narrative   Not on file   Social Determinants of Health   Financial Resource Strain: Not on file  Food Insecurity: Not on file  Transportation Needs: Not on file  Physical Activity: Not on file  Stress: Not on file  Social Connections: Not on file  Intimate Partner Violence: Not on file    Family History  Problem Relation Age of Onset   Dementia Mother    CAD Father     Past Medical History:  Diagnosis Date   Bell's palsy    CHF (congestive heart failure) (HCC)    Hypertension     Current Outpatient Medications  Medication Sig Dispense Refill   carvedilol (COREG) 25 MG tablet TAKE 1 TABLET(25 MG) BY MOUTH TWICE DAILY WITH A MEAL 60 tablet 2   dapagliflozin propanediol (FARXIGA) 10 MG TABS tablet Take 1 tablet (10 mg total) by mouth daily. 30 tablet 3   ENTRESTO 97-103 MG TAKE 1 TABLET BY MOUTH TWICE DAILY 60 tablet 5   rosuvastatin (CRESTOR) 40 MG tablet TAKE 1 TABLET BY MOUTH DAILY 90 tablet 3   spironolactone (ALDACTONE) 25 MG tablet TAKE 1 TABLET(25 MG) BY MOUTH DAILY 30 tablet 5   warfarin (COUMADIN) 5 MG tablet Take 1 tablet (5 mg total) by mouth daily. Take 1 to 1& 1/2 tablets by mouth daily as directed by the coumadin clinic. 100 tablet 0   sodium zirconium cyclosilicate (LOKELMA) 10 g PACK packet Take 1 full packet the first day then a 1/2 packet daily after the first day. 30 packet 0   No current facility-administered medications for this encounter.    Vitals:   08/06/21 1132  BP: 96/79  Pulse: (!) 59  SpO2: 98%  Weight: 82.9 kg (182 lb 12.8 oz)   Wt Readings from Last 3 Encounters:  08/06/21 82.9 kg (182 lb 12.8 oz)  04/05/21 83.1 kg (183 lb 3.2 oz)  12/04/20 89.1 kg (196 lb 6.4 oz)   PHYSICAL EXAM: General: NAD Neck: No JVD, no thyromegaly or thyroid nodule.  Lungs: Clear to auscultation bilaterally  with normal respiratory effort. CV: Nondisplaced PMI.  Heart regular S1/S2, no S3/S4, no murmur.  No peripheral edema.  No carotid bruit.  Normal pedal pulses.  Abdomen: Soft, nontender, no hepatosplenomegaly, no distention.  Skin: Intact without lesions or rashes.  Neurologic: Alert and oriented x 3.  Psych: Normal affect. Extremities: No clubbing or cyanosis.  HEENT: Normal.   ASSESSMENT & PLAN: 1. Chronic Systolic Heart Failure: Echo 08/2017 ECHO 20-25%. Ischemic cardiomyopathy and ETOH may be both have played a role. CPX in 9/20 with moderate-severe HF limitation. Echo in 5/22 showed EF 30-35%.  He has a Environmental manager  ICD.  NYHA class I-II symptoms.  He is not volume overloaded by exam or Heartlogic.  K remains elevated.   - Continue to use Lasix prn.  - Continue Coreg 25 mg bid.    - Continue Entresto 97/103 bid. - Continue spironolactone 25 mg daily but start Lokelma 10 g x 1 then 5 g daily after that to allow continuation of spironolactone.  BMET in 5 days.  - QRS narrow on ECG so no role for CRT.   - Continue dapagliflozin 10 mg daily.  2. CAD: Severe 2 vessel disease on 10/18 cath.  Diffuse, severely diseased LCx system and chronic total occlusion proximal LAD.  Cardiac MRI showed inferolateral aneurysm. Inferolateral wall did not appear viable so suspect intervention on LCx would not be helpful.  There did appear to be viability in the anterior and anteroseptal walls, the true apex was nonviable.  I discussed with Dr. Swaziland and Dr. Laneta Simmers during his 10/18 admission: not candidate for CTO of LAD or CABG. No chest pain.  - Continue Crestor 40 mg daily, good lipids in 1/22.    - Given use of warfarin, not taking ASA.  3. LV Thrombus: Continue warfarin. Not seen on 5/22 echo.  4. Smoking: He has quit.  5. ETOH: Minimal now.  6. Type II diabetes: On dapagliflozin.   Followup in 4 months with APP.   Signed, Marca Ancona, MD  08/09/2021  [facility] [address] [facility] [place] [address] [phone] (office) [phone] (fax)

## 2021-08-11 NOTE — Progress Notes (Signed)
Remote ICD transmission.   

## 2021-08-29 ENCOUNTER — Other Ambulatory Visit (HOSPITAL_COMMUNITY): Payer: Self-pay | Admitting: Cardiology

## 2021-08-31 ENCOUNTER — Other Ambulatory Visit: Payer: Self-pay

## 2021-08-31 ENCOUNTER — Ambulatory Visit (INDEPENDENT_AMBULATORY_CARE_PROVIDER_SITE_OTHER): Payer: BC Managed Care – PPO | Admitting: *Deleted

## 2021-08-31 DIAGNOSIS — Z5181 Encounter for therapeutic drug level monitoring: Secondary | ICD-10-CM | POA: Diagnosis not present

## 2021-08-31 DIAGNOSIS — I240 Acute coronary thrombosis not resulting in myocardial infarction: Secondary | ICD-10-CM | POA: Diagnosis not present

## 2021-08-31 DIAGNOSIS — I513 Intracardiac thrombosis, not elsewhere classified: Secondary | ICD-10-CM | POA: Diagnosis not present

## 2021-08-31 LAB — POCT INR: INR: 3.6 — AB (ref 2.0–3.0)

## 2021-08-31 NOTE — Patient Instructions (Signed)
Description   Do not take any Warfarin today then resume taking Warfarin 1 tablet (5mg ) daily except 1.5 tablets (7.5mg ) on Mondays, Wednesdays, and Fridays. Recheck INR in 3 weeks. [phone] Call coumadin clinic for changes in medications or up coming procedures.

## 2021-09-21 ENCOUNTER — Other Ambulatory Visit: Payer: Self-pay

## 2021-09-21 ENCOUNTER — Ambulatory Visit (INDEPENDENT_AMBULATORY_CARE_PROVIDER_SITE_OTHER): Payer: BC Managed Care – PPO | Admitting: *Deleted

## 2021-09-21 DIAGNOSIS — I513 Intracardiac thrombosis, not elsewhere classified: Secondary | ICD-10-CM

## 2021-09-21 DIAGNOSIS — Z5181 Encounter for therapeutic drug level monitoring: Secondary | ICD-10-CM

## 2021-09-21 DIAGNOSIS — I240 Acute coronary thrombosis not resulting in myocardial infarction: Secondary | ICD-10-CM | POA: Diagnosis not present

## 2021-09-21 LAB — POCT INR: INR: 1.8 — AB (ref 2.0–3.0)

## 2021-09-21 NOTE — Patient Instructions (Signed)
Description   Today take 1.5 tablets then resume taking Warfarin 1 tablet (5mg ) daily except 1.5 tablets (7.5mg ) on Mondays, Wednesdays, and Fridays. Recheck INR in 3 weeks. [phone] Call coumadin clinic for changes in medications or up coming procedures.

## 2021-10-04 ENCOUNTER — Other Ambulatory Visit: Payer: Self-pay | Admitting: Cardiology

## 2021-10-07 ENCOUNTER — Other Ambulatory Visit (HOSPITAL_COMMUNITY): Payer: Self-pay

## 2021-10-07 MED ORDER — CARVEDILOL 25 MG PO TABS: ORAL_TABLET | ORAL | 2 refills | Status: DC

## 2021-10-12 ENCOUNTER — Other Ambulatory Visit: Payer: Self-pay

## 2021-10-12 ENCOUNTER — Ambulatory Visit (INDEPENDENT_AMBULATORY_CARE_PROVIDER_SITE_OTHER): Payer: BC Managed Care – PPO

## 2021-10-12 DIAGNOSIS — I513 Intracardiac thrombosis, not elsewhere classified: Secondary | ICD-10-CM

## 2021-10-12 DIAGNOSIS — I240 Acute coronary thrombosis not resulting in myocardial infarction: Secondary | ICD-10-CM

## 2021-10-12 DIAGNOSIS — Z5181 Encounter for therapeutic drug level monitoring: Secondary | ICD-10-CM | POA: Diagnosis not present

## 2021-10-12 LAB — POCT INR: INR: 2 (ref 2.0–3.0)

## 2021-10-12 NOTE — Patient Instructions (Signed)
Description   Continue on same dosage of  Warfarin 1 tablet (5mg ) daily except 1.5 tablets (7.5mg ) on Mondays, Wednesdays, and Fridays. Recheck INR in 4 weeks. [phone] Call coumadin clinic for changes in medications or up coming procedures.

## 2021-10-28 ENCOUNTER — Other Ambulatory Visit (HOSPITAL_COMMUNITY): Payer: Self-pay | Admitting: Cardiology

## 2021-11-02 LAB — CUP PACEART REMOTE DEVICE CHECK
Battery Remaining Longevity: 138 mo
Battery Remaining Percentage: 100 %
Brady Statistic RA Percent Paced: 1 %
Brady Statistic RV Percent Paced: 2 %
Date Time Interrogation Session: 20221213025300
HighPow Impedance: 70 Ohm
Implantable Lead Implant Date: 20190910
Implantable Lead Implant Date: 20190910
Implantable Lead Location: 753859
Implantable Lead Location: 753860
Implantable Lead Model: 293
Implantable Lead Model: 7741
Implantable Lead Serial Number: 1054233
Implantable Lead Serial Number: 440947
Implantable Pulse Generator Implant Date: 20190910
Lead Channel Impedance Value: 428 Ohm
Lead Channel Impedance Value: 767 Ohm
Lead Channel Setting Pacing Amplitude: 2.5 V
Lead Channel Setting Pacing Amplitude: 2.5 V
Lead Channel Setting Pacing Pulse Width: 0.4 ms
Lead Channel Setting Sensing Sensitivity: 0.5 mV
Pulse Gen Serial Number: 550897

## 2021-11-05 ENCOUNTER — Ambulatory Visit (INDEPENDENT_AMBULATORY_CARE_PROVIDER_SITE_OTHER): Payer: BC Managed Care – PPO

## 2021-11-05 DIAGNOSIS — Z95810 Presence of automatic (implantable) cardiac defibrillator: Secondary | ICD-10-CM

## 2021-11-17 NOTE — Progress Notes (Signed)
Remote ICD transmission.   

## 2021-12-07 ENCOUNTER — Ambulatory Visit (INDEPENDENT_AMBULATORY_CARE_PROVIDER_SITE_OTHER): Payer: 59 | Admitting: *Deleted

## 2021-12-07 ENCOUNTER — Other Ambulatory Visit: Payer: Self-pay

## 2021-12-07 DIAGNOSIS — I240 Acute coronary thrombosis not resulting in myocardial infarction: Secondary | ICD-10-CM | POA: Diagnosis not present

## 2021-12-07 DIAGNOSIS — Z5181 Encounter for therapeutic drug level monitoring: Secondary | ICD-10-CM | POA: Diagnosis not present

## 2021-12-07 DIAGNOSIS — I513 Intracardiac thrombosis, not elsewhere classified: Secondary | ICD-10-CM | POA: Diagnosis not present

## 2021-12-07 LAB — POCT INR: INR: 2.9 (ref 2.0–3.0)

## 2021-12-07 NOTE — Patient Instructions (Signed)
Description   Continue taking Warfarin 1 tablet (5mg ) daily except 1.5 tablets (7.5mg ) on Mondays, Wednesdays, and Fridays. Recheck INR in 6 weeks. [phone] Call coumadin clinic for changes in medications or up coming procedures.

## 2021-12-20 DIAGNOSIS — E875 Hyperkalemia: Secondary | ICD-10-CM | POA: Diagnosis not present

## 2021-12-29 ENCOUNTER — Ambulatory Visit (INDEPENDENT_AMBULATORY_CARE_PROVIDER_SITE_OTHER): Payer: 59 | Admitting: Podiatry

## 2021-12-29 ENCOUNTER — Encounter: Payer: Self-pay | Admitting: Podiatry

## 2021-12-29 ENCOUNTER — Other Ambulatory Visit: Payer: Self-pay

## 2021-12-29 DIAGNOSIS — M21619 Bunion of unspecified foot: Secondary | ICD-10-CM

## 2021-12-29 DIAGNOSIS — M79674 Pain in right toe(s): Secondary | ICD-10-CM

## 2021-12-29 DIAGNOSIS — B351 Tinea unguium: Secondary | ICD-10-CM

## 2021-12-29 DIAGNOSIS — M2041 Other hammer toe(s) (acquired), right foot: Secondary | ICD-10-CM

## 2021-12-29 DIAGNOSIS — D689 Coagulation defect, unspecified: Secondary | ICD-10-CM | POA: Diagnosis not present

## 2021-12-29 DIAGNOSIS — M2042 Other hammer toe(s) (acquired), left foot: Secondary | ICD-10-CM

## 2021-12-29 DIAGNOSIS — M79675 Pain in left toe(s): Secondary | ICD-10-CM

## 2021-12-29 NOTE — Progress Notes (Signed)
°  Subjective:  Patient ID: Shawn Webster, male    DOB: [birth year],   MRN: [id number]  Chief Complaint  Patient presents with   Nail Problem    Right foot 2nd toe nail curved over causing discomfort     66 y.o. male presents for concern of right foot bunion and crossing over of second toe as well as concern for painful thickened and elongated toenails. Unable to trim himself and requesting trim today . Denies any other pedal complaints. Denies n/v/f/c.   Past Medical History:  Diagnosis Date   Bell's palsy    CHF (congestive heart failure) (HCC)    Hypertension     Objective:  Physical Exam: Vascular: DP/PT pulses 2/4 bilateral. CFT <3 seconds. Normal hair growth on digits. No edema.  Skin. No lacerations or abrasions bilateral feet. Nails 1-5 are thickened discolored and elongated with subungual debris.  Musculoskeletal: MMT 5/5 bilateral lower extremities in DF, PF, Inversion and Eversion. Deceased ROM in DF of ankle joint. HAV deformity noted bilateral with cross over toe on the right. Right more severe than left. No pain to palpation and no pain with ROM.  Bilateral hammered digits 2-5.  Neurological: Sensation intact to light touch.   Assessment:   1. Pain due to onychomycosis of toenails of both feet   2. Bunion   3. Hammer toes of both feet      Plan:  Patient was evaluated and treated and all questions answered. -Discussed and educated patient on foot care, especially with  regards to the vascular, neurological and musculoskeletal systems.  -Stressed the importance of good glycemic control and the detriment of not  controlling glucose levels in relation to the foot. -Discussed supportive shoes at all times and checking feet regularly.  -Mechanically debrided all nails 1-5 bilateral using sterile nail nipper and filed with dremel without incident  -Discussed bunion deformity and hammertoes and options. Discussed wide shoe gear and supportive inserts.  Dispensed to  spacers.  -Answered all patient questions -Patient to return as needed for foot care.  -Patient advised to call the office if any problems or questions arise in the meantime.   Louann Sjogren, DPM

## 2022-01-08 ENCOUNTER — Other Ambulatory Visit (HOSPITAL_COMMUNITY): Payer: Self-pay | Admitting: Cardiology

## 2022-01-18 ENCOUNTER — Other Ambulatory Visit: Payer: Self-pay

## 2022-01-18 ENCOUNTER — Ambulatory Visit (INDEPENDENT_AMBULATORY_CARE_PROVIDER_SITE_OTHER): Payer: 59

## 2022-01-18 DIAGNOSIS — I240 Acute coronary thrombosis not resulting in myocardial infarction: Secondary | ICD-10-CM | POA: Diagnosis not present

## 2022-01-18 DIAGNOSIS — I513 Intracardiac thrombosis, not elsewhere classified: Secondary | ICD-10-CM

## 2022-01-18 DIAGNOSIS — Z5181 Encounter for therapeutic drug level monitoring: Secondary | ICD-10-CM

## 2022-01-18 LAB — POCT INR: INR: 2.3 (ref 2.0–3.0)

## 2022-01-18 NOTE — Patient Instructions (Signed)
Description   °Continue taking Warfarin 1 tablet (5mg) daily except 1.5 tablets (7.5mg) on Mondays, Wednesdays, and Fridays. Recheck INR in 6 weeks. [phone] Call coumadin clinic for changes in medications or up coming procedures.  °  °  °

## 2022-02-01 LAB — CUP PACEART REMOTE DEVICE CHECK
Battery Remaining Longevity: 138 mo
Battery Remaining Percentage: 100 %
Brady Statistic RA Percent Paced: 1 %
Brady Statistic RV Percent Paced: 2 %
Date Time Interrogation Session: 20230314024100
HighPow Impedance: 59 Ohm
Implantable Lead Implant Date: 20190910
Implantable Lead Implant Date: 20190910
Implantable Lead Location: 753859
Implantable Lead Location: 753860
Implantable Lead Model: 293
Implantable Lead Model: 7741
Implantable Lead Serial Number: 1054233
Implantable Lead Serial Number: 440947
Implantable Pulse Generator Implant Date: 20190910
Lead Channel Impedance Value: 414 Ohm
Lead Channel Impedance Value: 742 Ohm
Lead Channel Setting Pacing Amplitude: 2.5 V
Lead Channel Setting Pacing Amplitude: 2.5 V
Lead Channel Setting Pacing Pulse Width: 0.4 ms
Lead Channel Setting Sensing Sensitivity: 0.5 mV
Pulse Gen Serial Number: 550897

## 2022-02-04 ENCOUNTER — Ambulatory Visit (INDEPENDENT_AMBULATORY_CARE_PROVIDER_SITE_OTHER): Payer: BC Managed Care – PPO

## 2022-02-04 DIAGNOSIS — I5022 Chronic systolic (congestive) heart failure: Secondary | ICD-10-CM

## 2022-02-11 NOTE — Progress Notes (Signed)
Remote ICD transmission.   

## 2022-02-24 ENCOUNTER — Other Ambulatory Visit (HOSPITAL_COMMUNITY): Payer: Self-pay | Admitting: Cardiology

## 2022-02-24 DIAGNOSIS — I240 Acute coronary thrombosis not resulting in myocardial infarction: Secondary | ICD-10-CM

## 2022-02-26 ENCOUNTER — Other Ambulatory Visit (HOSPITAL_COMMUNITY): Payer: Self-pay | Admitting: Cardiology

## 2022-03-01 ENCOUNTER — Ambulatory Visit (INDEPENDENT_AMBULATORY_CARE_PROVIDER_SITE_OTHER): Payer: 59

## 2022-03-01 DIAGNOSIS — Z5181 Encounter for therapeutic drug level monitoring: Secondary | ICD-10-CM

## 2022-03-01 DIAGNOSIS — I513 Intracardiac thrombosis, not elsewhere classified: Secondary | ICD-10-CM

## 2022-03-01 DIAGNOSIS — I240 Acute coronary thrombosis not resulting in myocardial infarction: Secondary | ICD-10-CM | POA: Diagnosis not present

## 2022-03-01 LAB — POCT INR: INR: 2.4 (ref 2.0–3.0)

## 2022-03-01 NOTE — Patient Instructions (Signed)
Description   ?Continue taking Warfarin 1 tablet (5mg ) daily except 1.5 tablets (7.5mg ) on Mondays, Wednesdays, and Fridays. Recheck INR in 6 weeks. [phone] Call coumadin clinic for changes in medications or up coming procedures.  ?  ?  ?

## 2022-03-09 ENCOUNTER — Other Ambulatory Visit (HOSPITAL_COMMUNITY): Payer: Self-pay

## 2022-03-09 MED ORDER — DAPAGLIFLOZIN PROPANEDIOL 10 MG PO TABS: 10.0000 mg | ORAL_TABLET | Freq: Every day | ORAL | 1 refills | Status: DC

## 2022-04-13 ENCOUNTER — Other Ambulatory Visit (HOSPITAL_COMMUNITY): Payer: Self-pay | Admitting: Cardiology

## 2022-05-05 LAB — CUP PACEART REMOTE DEVICE CHECK
Battery Remaining Longevity: 126 mo
Battery Remaining Percentage: 100 %
Brady Statistic RA Percent Paced: 1 %
Brady Statistic RV Percent Paced: 2 %
Date Time Interrogation Session: 20230614024100
HighPow Impedance: 67 Ohm
Implantable Lead Implant Date: 20190910
Implantable Lead Implant Date: 20190910
Implantable Lead Location: 753859
Implantable Lead Location: 753860
Implantable Lead Model: 293
Implantable Lead Model: 7741
Implantable Lead Serial Number: 1054233
Implantable Lead Serial Number: 440947
Implantable Pulse Generator Implant Date: 20190910
Lead Channel Impedance Value: 415 Ohm
Lead Channel Impedance Value: 637 Ohm
Lead Channel Setting Pacing Amplitude: 2.5 V
Lead Channel Setting Pacing Amplitude: 2.5 V
Lead Channel Setting Pacing Pulse Width: 0.4 ms
Lead Channel Setting Sensing Sensitivity: 0.5 mV
Pulse Gen Serial Number: 550897

## 2022-05-06 ENCOUNTER — Ambulatory Visit (INDEPENDENT_AMBULATORY_CARE_PROVIDER_SITE_OTHER): Payer: BC Managed Care – PPO

## 2022-05-06 DIAGNOSIS — I509 Heart failure, unspecified: Secondary | ICD-10-CM

## 2022-05-12 NOTE — Progress Notes (Signed)
Remote ICD transmission.   

## 2022-05-17 ENCOUNTER — Telehealth: Payer: Self-pay | Admitting: Cardiology

## 2022-05-17 ENCOUNTER — Other Ambulatory Visit (HOSPITAL_COMMUNITY): Payer: Self-pay | Admitting: *Deleted

## 2022-05-17 MED ORDER — DAPAGLIFLOZIN PROPANEDIOL 10 MG PO TABS: 10.0000 mg | ORAL_TABLET | Freq: Every day | ORAL | 1 refills | Status: AC

## 2022-05-17 MED ORDER — SPIRONOLACTONE 25 MG PO TABS: ORAL_TABLET | ORAL | 5 refills | Status: DC

## 2022-05-18 ENCOUNTER — Telehealth (HOSPITAL_COMMUNITY): Payer: Self-pay | Admitting: Pharmacy Technician

## 2022-05-18 ENCOUNTER — Other Ambulatory Visit (HOSPITAL_COMMUNITY): Payer: Self-pay

## 2022-05-18 NOTE — Telephone Encounter (Signed)
Patient Advocate Encounter   Received notification from [employer] that prior authorization for Marcelline Deist is required.   PA submitted on CoverMyMeds Key [id number] Status is pending   Will continue to follow.

## 2022-05-19 ENCOUNTER — Telehealth: Payer: Self-pay | Admitting: Cardiology

## 2022-05-19 ENCOUNTER — Other Ambulatory Visit: Payer: Self-pay | Admitting: Cardiology

## 2022-05-19 ENCOUNTER — Other Ambulatory Visit (HOSPITAL_COMMUNITY): Payer: Self-pay

## 2022-05-19 ENCOUNTER — Ambulatory Visit (INDEPENDENT_AMBULATORY_CARE_PROVIDER_SITE_OTHER): Payer: 59 | Admitting: *Deleted

## 2022-05-19 DIAGNOSIS — Z5181 Encounter for therapeutic drug level monitoring: Secondary | ICD-10-CM | POA: Diagnosis not present

## 2022-05-19 DIAGNOSIS — I240 Acute coronary thrombosis not resulting in myocardial infarction: Secondary | ICD-10-CM

## 2022-05-19 DIAGNOSIS — I513 Intracardiac thrombosis, not elsewhere classified: Secondary | ICD-10-CM | POA: Diagnosis not present

## 2022-05-19 LAB — PROTIME-INR
INR: 7.4 (ref 0.9–1.2)
Prothrombin Time: 67.5 s — ABNORMAL HIGH (ref 9.1–12.0)

## 2022-05-19 LAB — POCT INR: INR: 8 — AB (ref 2.0–3.0)

## 2022-05-19 NOTE — Telephone Encounter (Signed)
Spoke with pt at 146pm and gave instructions for INR result of 7.4; please refer to Anticoagulation Encounter for details.

## 2022-05-19 NOTE — Patient Instructions (Signed)
Description   Pt taken to lab for STAT INR.  STAT INR resulted as 7.4; Spoke with pt and instructed to hold today's dose of warfarin, hold tomorrow's dose of warfarin, and Saturday's dose of warfarin then continue taking Warfarin 1 tablet (5mg ) daily except 1.5 tablets (7.5mg ) on Mondays, Wednesdays, and Fridays. Recheck INR in 1 week (normally 6 weeks). [phone] Call coumadin clinic for changes in medications or up coming procedures.

## 2022-05-19 NOTE — Telephone Encounter (Signed)
[facility] calling with critical labs 

## 2022-05-19 NOTE — Telephone Encounter (Signed)
Received a call from [facility] calling to report patient's elevated PT 66.5 INR 7.4.Advised I will send results to coumadin clinic.

## 2022-05-25 NOTE — Telephone Encounter (Signed)
[facility] Patient Advocate Encounter  Prior Authorization for Marcelline Deist has been approved.    PA# [id number] Effective dates: 05/19/22 through 05/20/23  Archer Asa, CPhT

## 2022-06-06 ENCOUNTER — Ambulatory Visit (INDEPENDENT_AMBULATORY_CARE_PROVIDER_SITE_OTHER): Payer: 59

## 2022-06-06 DIAGNOSIS — Z5181 Encounter for therapeutic drug level monitoring: Secondary | ICD-10-CM | POA: Diagnosis not present

## 2022-06-06 DIAGNOSIS — I240 Acute coronary thrombosis not resulting in myocardial infarction: Secondary | ICD-10-CM | POA: Diagnosis not present

## 2022-06-06 DIAGNOSIS — I513 Intracardiac thrombosis, not elsewhere classified: Secondary | ICD-10-CM

## 2022-06-06 LAB — POCT INR: INR: 2.1 (ref 2.0–3.0)

## 2022-06-06 NOTE — Patient Instructions (Signed)
continue taking Warfarin 1 tablet (5mg ) daily except 1.5 tablets (7.5mg ) on Mondays, Wednesdays, and Fridays. Recheck INR in 6 weeks. [phone] Call coumadin clinic for changes in medications or up coming procedures.

## 2022-06-28 ENCOUNTER — Other Ambulatory Visit (HOSPITAL_COMMUNITY): Payer: Self-pay | Admitting: Cardiology

## 2022-07-18 ENCOUNTER — Ambulatory Visit: Payer: 59 | Attending: Cardiology

## 2022-07-30 ENCOUNTER — Other Ambulatory Visit (HOSPITAL_COMMUNITY): Payer: Self-pay | Admitting: Cardiology

## 2022-08-05 ENCOUNTER — Telehealth: Payer: Self-pay | Admitting: *Deleted

## 2022-08-05 NOTE — Telephone Encounter (Signed)
Called pt and he states he has moved to [place] with his son and he will be moving to [facility], [place] next week. Pt was advised he needs to get his INR checked and states he cannot because he has too much going on.   Advised of stroke & clot risks & bleeding without being monitored while on Warfarin. Also, advised that he really needs to get established with a Primary Care Physician and have them monitor levels since he is unwilling to return to [place]. He states he will call on Monday regarding an appt. Also, advised he will need to let us know if he is willing to return and have INR done since at this time he is not willing. He is aware that warfarin is a monitored medication and he stated he is aware. At this time pt will not be returning to [place].

## 2022-08-08 ENCOUNTER — Telehealth (HOSPITAL_COMMUNITY): Payer: Self-pay

## 2022-08-08 NOTE — Telephone Encounter (Signed)
Shawn Webster or Shawn Webster with [employer] in [place].

## 2022-08-08 NOTE — Telephone Encounter (Signed)
Patients wants to know if you know any cardiologist that he can be referred to in the [place] area?

## 2022-08-08 NOTE — Telephone Encounter (Signed)
Patient advised and verbalized understanding and very appreciative

## 2022-08-15 ENCOUNTER — Ambulatory Visit (INDEPENDENT_AMBULATORY_CARE_PROVIDER_SITE_OTHER): Payer: 59

## 2022-08-15 DIAGNOSIS — I5022 Chronic systolic (congestive) heart failure: Secondary | ICD-10-CM

## 2022-08-16 LAB — CUP PACEART REMOTE DEVICE CHECK
Battery Remaining Longevity: 144 mo
Battery Remaining Percentage: 100 %
Brady Statistic RA Percent Paced: 1 %
Brady Statistic RV Percent Paced: 2 %
Date Time Interrogation Session: 20230923215700
HighPow Impedance: 73 Ohm
Implantable Lead Implant Date: 20190910
Implantable Lead Implant Date: 20190910
Implantable Lead Location: 753859
Implantable Lead Location: 753860
Implantable Lead Model: 293
Implantable Lead Model: 7741
Implantable Lead Serial Number: 1054233
Implantable Lead Serial Number: 440947
Implantable Pulse Generator Implant Date: 20190910
Lead Channel Impedance Value: 463 Ohm
Lead Channel Impedance Value: 758 Ohm
Lead Channel Setting Pacing Amplitude: 2.5 V
Lead Channel Setting Pacing Amplitude: 2.5 V
Lead Channel Setting Pacing Pulse Width: 0.4 ms
Lead Channel Setting Sensing Sensitivity: 0.5 mV
Pulse Gen Serial Number: 550897

## 2022-08-25 NOTE — Progress Notes (Signed)
Remote ICD transmission.   

## 2022-08-27 ENCOUNTER — Other Ambulatory Visit (HOSPITAL_COMMUNITY): Payer: Self-pay | Admitting: Cardiology

## 2022-09-22 ENCOUNTER — Other Ambulatory Visit (HOSPITAL_COMMUNITY): Payer: Self-pay | Admitting: Cardiology

## 2022-10-14 ENCOUNTER — Other Ambulatory Visit (HOSPITAL_COMMUNITY): Payer: Self-pay | Admitting: Cardiology

## 2022-10-28 ENCOUNTER — Other Ambulatory Visit (HOSPITAL_COMMUNITY): Payer: Self-pay | Admitting: Cardiology

## 2022-11-10 ENCOUNTER — Other Ambulatory Visit (HOSPITAL_COMMUNITY): Payer: Self-pay | Admitting: Cardiology

## 2022-11-16 ENCOUNTER — Other Ambulatory Visit (HOSPITAL_COMMUNITY): Payer: Self-pay

## 2022-11-16 MED ORDER — ROSUVASTATIN CALCIUM 40 MG PO TABS: 40.0000 mg | ORAL_TABLET | Freq: Every day | ORAL | 0 refills | Status: DC

## 2022-11-17 ENCOUNTER — Other Ambulatory Visit (HOSPITAL_COMMUNITY): Payer: Self-pay

## 2022-11-17 MED ORDER — ROSUVASTATIN CALCIUM 40 MG PO TABS: 40.0000 mg | ORAL_TABLET | Freq: Every day | ORAL | 0 refills | Status: AC

## 2022-12-28 ENCOUNTER — Other Ambulatory Visit (HOSPITAL_COMMUNITY): Payer: Self-pay | Admitting: Cardiology

## 2023-02-13 ENCOUNTER — Ambulatory Visit (INDEPENDENT_AMBULATORY_CARE_PROVIDER_SITE_OTHER): Payer: 59

## 2023-02-13 DIAGNOSIS — I5022 Chronic systolic (congestive) heart failure: Secondary | ICD-10-CM | POA: Diagnosis not present

## 2023-02-17 LAB — CUP PACEART REMOTE DEVICE CHECK
Battery Remaining Longevity: 120 mo
Battery Remaining Percentage: 100 %
Brady Statistic RA Percent Paced: 1 %
Brady Statistic RV Percent Paced: 2 %
Date Time Interrogation Session: 20240327095100
HighPow Impedance: 69 Ohm
Implantable Lead Connection Status: 753985
Implantable Lead Connection Status: 753985
Implantable Lead Implant Date: 20190910
Implantable Lead Implant Date: 20190910
Implantable Lead Location: 753859
Implantable Lead Location: 753860
Implantable Lead Model: 293
Implantable Lead Model: 7741
Implantable Lead Serial Number: 1054233
Implantable Lead Serial Number: 440947
Implantable Pulse Generator Implant Date: 20190910
Lead Channel Impedance Value: 489 Ohm
Lead Channel Impedance Value: 664 Ohm
Lead Channel Setting Pacing Amplitude: 2.5 V
Lead Channel Setting Pacing Amplitude: 2.5 V
Lead Channel Setting Pacing Pulse Width: 0.4 ms
Lead Channel Setting Sensing Sensitivity: 0.5 mV
Pulse Gen Serial Number: 550897
Zone Setting Status: 755011

## 2023-03-24 NOTE — Progress Notes (Signed)
Remote ICD transmission.   

## 2023-05-15 ENCOUNTER — Ambulatory Visit: Payer: 59 | Attending: Internal Medicine

## 2023-05-15 DIAGNOSIS — I5022 Chronic systolic (congestive) heart failure: Secondary | ICD-10-CM

## 2023-05-26 LAB — CUP PACEART REMOTE DEVICE CHECK
Battery Remaining Longevity: 120 mo
Battery Remaining Percentage: 100 %
Brady Statistic RA Percent Paced: 1 %
Brady Statistic RV Percent Paced: 2 %
Date Time Interrogation Session: 20240628131400
HighPow Impedance: 69 Ohm
Implantable Lead Connection Status: 753985
Implantable Lead Connection Status: 753985
Implantable Lead Implant Date: 20190910
Implantable Lead Implant Date: 20190910
Implantable Lead Location: 753859
Implantable Lead Location: 753860
Implantable Lead Model: 293
Implantable Lead Model: 7741
Implantable Lead Serial Number: 1054233
Implantable Lead Serial Number: 440947
Implantable Pulse Generator Implant Date: 20190910
Lead Channel Impedance Value: 523 Ohm
Lead Channel Impedance Value: 688 Ohm
Lead Channel Setting Pacing Amplitude: 2.5 V
Lead Channel Setting Pacing Amplitude: 2.5 V
Lead Channel Setting Pacing Pulse Width: 0.4 ms
Lead Channel Setting Sensing Sensitivity: 0.5 mV
Pulse Gen Serial Number: 550897
Zone Setting Status: 755011

## 2023-06-02 NOTE — Progress Notes (Signed)
Remote ICD transmission.   

## 2023-11-13 ENCOUNTER — Ambulatory Visit (INDEPENDENT_AMBULATORY_CARE_PROVIDER_SITE_OTHER): Payer: 59

## 2023-11-13 DIAGNOSIS — I5022 Chronic systolic (congestive) heart failure: Secondary | ICD-10-CM

## 2023-11-20 LAB — CUP PACEART REMOTE DEVICE CHECK
Battery Remaining Longevity: 114 mo
Battery Remaining Percentage: 100 %
Brady Statistic RA Percent Paced: 1 %
Brady Statistic RV Percent Paced: 2 %
Date Time Interrogation Session: 20241227131000
HighPow Impedance: 69 Ohm
Lead Channel Impedance Value: 556 Ohm
Lead Channel Impedance Value: 670 Ohm
Lead Channel Setting Pacing Amplitude: 2.5 V
Lead Channel Setting Pacing Amplitude: 2.5 V
Lead Channel Setting Pacing Pulse Width: 0.4 ms
Lead Channel Setting Sensing Sensitivity: 0.5 mV
Pulse Gen Serial Number: 550897
Zone Setting Status: 755011

## 2023-12-20 NOTE — Progress Notes (Signed)
Remote ICD transmission.

## 2023-12-20 NOTE — Addendum Note (Signed)
Addended by: Geralyn Flash D on: 12/20/2023 11:02 AM   Modules accepted: Orders

## 2024-01-02 ENCOUNTER — Telehealth: Payer: Self-pay

## 2024-01-02 NOTE — Telephone Encounter (Signed)
Pt called back and would like a call back.

## 2024-01-02 NOTE — Telephone Encounter (Signed)
Pt difficult to get a hold of. Says he was a little lightheaded before shock and fine since. Was rushing and packing his car to leave [employer]BnB. UTA medication compliance. Scheduled w/ CL tomorrow @ 330 to reestablish.

## 2024-01-02 NOTE — Telephone Encounter (Addendum)
Alert remote transmission: ATP and shock  -VF zone event on 2/6 @ 1048 with V-rates 202 bpm treated with ATP x1 and shock x1. EGM shows AT/AF with initially irregular R-R with more regular R-R post ATP.  Shock terminated both arrhythmias.   -Several NSVT and VT-1 events, V-rates 160-190s, most on 2/6.  EGMs with AF with irregular R-R, likely atrial driven tachy.  -Several AHR events, longest 2.5 hrs on 2/2.  On OAC per EMR.  - Patient also has not been seen in clinic since 2019. Needs to re-estab care.   LM for patient to return call, assess symptoms.  Cassie is holding a 2/13 4pm in office appt with Dr. Lalla Brothers (Prior [facility] patient, not seen since 2019). for patient unless he needs ER due to symptoms.

## 2024-01-03 ENCOUNTER — Ambulatory Visit: Payer: 59 | Attending: Cardiology | Admitting: Cardiology

## 2024-01-03 NOTE — Progress Notes (Deleted)
  Electrophysiology Office Note:    Date:  01/03/2024   ID:  [name], DOB 1956-03-03, MRN [id number]  [facility] Cardiologist:  Marca Ancona, MD  [facility] Electrophysiologist:  None   Referring MD: Renford Dills, MD   Chief Complaint: ICD in situ  History of Present Illness:    [name] is a 68 year old man who I am seeing today for an evaluation of an ICD shock.  He is reestablishing care with our clinic.  He was last seen by an EP provider December 2020.  His medical history includes chronic systolic heart failure, LV thrombus on Coumadin, severe coronary artery disease and tobacco abuse.  His 2020 appointment was with Mardelle Matte.  He has a Environmental manager dual-chamber ICD that was implanted July 31, 2018 for ischemic cardiomyopathy.  At the 2020 appointment, his device was functioning appropriately.  He reported NYHA class II-III heart failure symptoms.  After that appointment he was lost to EP follow-up.  He saw Dr. Shirlee Latch in the heart failure clinic January 2022.  Alert was sent in February 11th 2025 for an ICD shock.  EGM's suggested atrial fibrillation with rapid ventricular rates leading to shock.  He was minimally symptomatic at the time of the shock.  He is on Coumadin for history of LV thrombus but does not carry a diagnosis of atrial fibrillation.     Their past medical, social and family history was reviewed.   ROS:   Please see the history of present illness.    All other systems reviewed and are negative.  EKGs/Labs/Other Studies Reviewed:    The following studies were reviewed today:  January 03, 2024 in clinic device interrogation personally reviewed ***       Physical Exam:    VS:  There were no vitals taken for this visit.    Wt Readings from Last 3 Encounters:  08/06/21 182 lb 12.8 oz (82.9 kg)  04/05/21 183 lb 3.2 oz (83.1 kg)  12/04/20 196 lb 6.4 oz (89.1 kg)     GEN: no distress CARD: RRR, No MRG.  ICD pocket  well-healed RESP: No IWOB. CTAB.        ASSESSMENT AND PLAN:    No diagnosis found.  #Atrial fibrillation New diagnosis.  Lead to inappropriate ICD shock. Continue Coreg Continue Coumadin  #Chronic systolic heart failure #LV mural thrombus NYHA class II.  Warm dry on exam.  Follows with Dr. Shirlee Latch in the heart failure clinic.  Continue spironolactone, Sherryll Burger, Farxiga, Coreg   Signed, Rossie Muskrat. Lalla Brothers, MD, [facility], [facility] 01/03/2024 6:08 AM    Electrophysiology [facility]

## 2024-01-04 ENCOUNTER — Encounter: Payer: Self-pay | Admitting: Cardiology

## 2024-02-12 ENCOUNTER — Encounter: Payer: Self-pay | Admitting: Internal Medicine

## 2024-02-12 ENCOUNTER — Ambulatory Visit (INDEPENDENT_AMBULATORY_CARE_PROVIDER_SITE_OTHER): Payer: 59

## 2024-02-12 DIAGNOSIS — I5022 Chronic systolic (congestive) heart failure: Secondary | ICD-10-CM | POA: Diagnosis not present

## 2024-02-12 LAB — CUP PACEART REMOTE DEVICE CHECK
Battery Remaining Longevity: 108 mo
Battery Remaining Percentage: 97 %
Brady Statistic RA Percent Paced: 1 %
Brady Statistic RV Percent Paced: 2 %
Date Time Interrogation Session: 20250322170200
HighPow Impedance: 64 Ohm
Lead Channel Impedance Value: 554 Ohm
Lead Channel Impedance Value: 697 Ohm
Lead Channel Setting Pacing Amplitude: 2.5 V
Lead Channel Setting Pacing Amplitude: 2.5 V
Lead Channel Setting Pacing Pulse Width: 0.4 ms
Lead Channel Setting Sensing Sensitivity: 0.5 mV
Pulse Gen Serial Number: 550897
Zone Setting Status: 755011

## 2024-03-29 NOTE — Progress Notes (Signed)
 Remote ICD transmission.

## 2024-05-13 ENCOUNTER — Ambulatory Visit: Payer: 59

## 2024-05-21 ENCOUNTER — Ambulatory Visit

## 2024-05-21 DIAGNOSIS — I5022 Chronic systolic (congestive) heart failure: Secondary | ICD-10-CM | POA: Diagnosis not present

## 2024-05-23 LAB — CUP PACEART REMOTE DEVICE CHECK
Battery Remaining Longevity: 108 mo
Battery Remaining Percentage: 97 %
Brady Statistic RA Percent Paced: 1 %
Brady Statistic RV Percent Paced: 2 %
Date Time Interrogation Session: 20250701134600
HighPow Impedance: 72 Ohm
Lead Channel Impedance Value: 570 Ohm
Lead Channel Impedance Value: 677 Ohm
Lead Channel Setting Pacing Amplitude: 2.5 V
Lead Channel Setting Pacing Amplitude: 2.5 V
Lead Channel Setting Pacing Pulse Width: 0.4 ms
Lead Channel Setting Sensing Sensitivity: 0.5 mV
Pulse Gen Serial Number: 550897
Zone Setting Status: 755011

## 2024-05-29 ENCOUNTER — Ambulatory Visit: Payer: Self-pay | Admitting: Internal Medicine

## 2024-07-11 ENCOUNTER — Encounter: Payer: Self-pay | Admitting: Emergency Medicine

## 2024-08-20 ENCOUNTER — Ambulatory Visit (INDEPENDENT_AMBULATORY_CARE_PROVIDER_SITE_OTHER)

## 2024-08-20 DIAGNOSIS — I5022 Chronic systolic (congestive) heart failure: Secondary | ICD-10-CM

## 2024-08-22 NOTE — Progress Notes (Signed)
 Remote ICD Transmission

## 2024-08-28 ENCOUNTER — Telehealth: Payer: Self-pay

## 2024-08-28 LAB — CUP PACEART REMOTE DEVICE CHECK
Battery Remaining Longevity: 96 mo
Battery Remaining Percentage: 86 %
Brady Statistic RA Percent Paced: 1 %
Brady Statistic RV Percent Paced: 2 %
Date Time Interrogation Session: 20251001045300
HighPow Impedance: 77 Ohm
Lead Channel Impedance Value: 589 Ohm
Lead Channel Impedance Value: 699 Ohm
Lead Channel Setting Pacing Amplitude: 2.5 V
Lead Channel Setting Pacing Amplitude: 2.5 V
Lead Channel Setting Pacing Pulse Width: 0.4 ms
Lead Channel Setting Sensing Sensitivity: 0.5 mV
Pulse Gen Serial Number: 550897
Zone Setting Status: 755011

## 2024-08-28 NOTE — Telephone Encounter (Addendum)
 Alert received today 08/28/2024:  Alert remote transmission:  Antitachycardia pacing (ATP) therapy delivered to convert arrhythmia. Ventricular shock therapy delivered to convert arrhythmia. Event occurred 9/24 @ 10:21, EGM c/w AF with RVR vs VT, ATP delivered x4 followed by 41J of HV therapy, discriminator morphology changes post HV therapy with return to AS/VS ~6sec post shock ATR event #993 AF with RVR with 4 bursts of ATP - Route to triage high alert per protocol  Outreach made to Pt.  Left message requesting call back.  Pt has not been seen in clinic since 2020.

## 2024-08-28 NOTE — Progress Notes (Signed)
 Remote ICD Transmission

## 2024-08-29 ENCOUNTER — Ambulatory Visit: Payer: Self-pay | Admitting: Internal Medicine

## 2024-08-29 NOTE — Telephone Encounter (Signed)
 Attempted to contact patient. No answer, left message to call back.   MyChart has not used since 02/2023.

## 2024-08-30 NOTE — Telephone Encounter (Signed)
 Attempted to contact patient. No answer, left message to call back.   MyChart has not used since 02/2023.

## 2024-09-02 NOTE — Progress Notes (Signed)
 Certified letter sent

## 2024-09-03 NOTE — Telephone Encounter (Signed)
 Unable to contact Pt.  Letter mailed to Pt.

## 2024-11-19 ENCOUNTER — Ambulatory Visit

## 2024-11-19 DIAGNOSIS — I5022 Chronic systolic (congestive) heart failure: Secondary | ICD-10-CM | POA: Diagnosis not present

## 2024-11-20 LAB — CUP PACEART REMOTE DEVICE CHECK
Battery Remaining Longevity: 96 mo
Battery Remaining Percentage: 88 %
Brady Statistic RA Percent Paced: 1 %
Brady Statistic RV Percent Paced: 2 %
Date Time Interrogation Session: 20251230062300
HighPow Impedance: 82 Ohm
Lead Channel Impedance Value: 592 Ohm
Lead Channel Impedance Value: 762 Ohm
Lead Channel Setting Pacing Amplitude: 2.5 V
Lead Channel Setting Pacing Amplitude: 2.5 V
Lead Channel Setting Pacing Pulse Width: 0.4 ms
Lead Channel Setting Sensing Sensitivity: 0.5 mV
Pulse Gen Serial Number: 550897
Zone Setting Status: 755011

## 2024-11-24 ENCOUNTER — Ambulatory Visit: Payer: Self-pay | Admitting: Cardiology

## 2024-11-27 NOTE — Progress Notes (Signed)
 Remote ICD Transmission

## 2025-02-18 ENCOUNTER — Encounter

## 2025-05-20 ENCOUNTER — Encounter

## 2025-08-19 ENCOUNTER — Encounter
